# Patient Record
Sex: Female | Born: 1945 | Race: White | Hispanic: Refuse to answer | Marital: Married | Smoking: Former smoker
Health system: Southern US, Community
[De-identification: ages and names within clinical notes are randomized; demographics above are authoritative.]

## PROBLEM LIST (undated history)

## (undated) DIAGNOSIS — M810 Age-related osteoporosis without current pathological fracture: Secondary | ICD-10-CM

## (undated) DIAGNOSIS — M51369 Other intervertebral disc degeneration, lumbar region without mention of lumbar back pain or lower extremity pain: Secondary | ICD-10-CM

## (undated) DIAGNOSIS — M1990 Unspecified osteoarthritis, unspecified site: Secondary | ICD-10-CM

## (undated) DIAGNOSIS — E785 Hyperlipidemia, unspecified: Secondary | ICD-10-CM

## (undated) DIAGNOSIS — I10 Essential (primary) hypertension: Secondary | ICD-10-CM

## (undated) DIAGNOSIS — M5136 Other intervertebral disc degeneration, lumbar region: Secondary | ICD-10-CM

## (undated) HISTORY — PX: JOINT REPLACEMENT: SHX530

## (undated) HISTORY — PX: TONSILLECTOMY AND ADENOIDECTOMY: SUR1326

## (undated) HISTORY — PX: ABDOMINAL HYSTERECTOMY: SHX81

## (undated) HISTORY — DX: Age-related osteoporosis without current pathological fracture: M81.0

## (undated) HISTORY — PX: COLONOSCOPY: SHX174

## (undated) HISTORY — PX: FRACTURE SURGERY: SHX138

## (undated) HISTORY — PX: TUBAL LIGATION: SHX77

## (undated) HISTORY — DX: Essential (primary) hypertension: I10

## (undated) HISTORY — PX: LASER ABLATION: SHX1947

---

## 1993-06-03 HISTORY — PX: OTHER SURGICAL HISTORY: SHX169

## 2003-06-04 HISTORY — PX: EYE SURGERY: SHX253

## 2008-07-01 DIAGNOSIS — I10 Essential (primary) hypertension: Secondary | ICD-10-CM | POA: Insufficient documentation

## 2018-05-19 LAB — HM DEXA SCAN

## 2018-05-20 LAB — COMPREHENSIVE METABOLIC PANEL: Calcium: 9.3 (ref 8.7–10.7)

## 2018-05-20 LAB — LIPID PANEL
Cholesterol: 145 (ref 0–200)
HDL: 49 (ref 35–70)
LDL Cholesterol: 83
Triglycerides: 65 (ref 40–160)

## 2018-05-20 LAB — HEPATIC FUNCTION PANEL
ALT: 11 (ref 7–35)
AST: 16 (ref 13–35)

## 2018-05-20 LAB — BASIC METABOLIC PANEL
Creatinine: 0.6 (ref 0.5–1.1)
Glucose: 97
Potassium: 3.6 (ref 3.4–5.3)
Sodium: 144 (ref 137–147)

## 2019-06-25 LAB — VITAMIN B12: Vitamin B-12: 415

## 2019-06-25 LAB — TSH: TSH: 1.25 (ref 0.41–5.90)

## 2019-07-07 ENCOUNTER — Other Ambulatory Visit (INDEPENDENT_AMBULATORY_CARE_PROVIDER_SITE_OTHER): Payer: Self-pay | Admitting: Nurse Practitioner

## 2019-07-07 DIAGNOSIS — I83813 Varicose veins of bilateral lower extremities with pain: Secondary | ICD-10-CM

## 2019-07-09 ENCOUNTER — Ambulatory Visit (INDEPENDENT_AMBULATORY_CARE_PROVIDER_SITE_OTHER): Payer: Medicare Other

## 2019-07-09 ENCOUNTER — Ambulatory Visit (INDEPENDENT_AMBULATORY_CARE_PROVIDER_SITE_OTHER): Payer: Medicare Other | Admitting: Nurse Practitioner

## 2019-07-09 ENCOUNTER — Other Ambulatory Visit: Payer: Self-pay

## 2019-07-09 ENCOUNTER — Encounter (INDEPENDENT_AMBULATORY_CARE_PROVIDER_SITE_OTHER): Payer: Self-pay | Admitting: Nurse Practitioner

## 2019-07-09 VITALS — BP 163/94 | HR 73 | Resp 16 | Ht 68.0 in | Wt 159.0 lb

## 2019-07-09 DIAGNOSIS — I83813 Varicose veins of bilateral lower extremities with pain: Secondary | ICD-10-CM

## 2019-07-13 ENCOUNTER — Encounter (INDEPENDENT_AMBULATORY_CARE_PROVIDER_SITE_OTHER): Payer: Self-pay | Admitting: Nurse Practitioner

## 2019-07-13 NOTE — Progress Notes (Signed)
SUBJECTIVE:  Patient ID: Debbie Montgomery, female    DOB: 10/16/1945, 74 y.o.   MRN: [id number] Chief Complaint  Patient presents with  . New Patient (Initial Visit)    varicose veins with pain    HPI  Debbie Montgomery is a 74 y.o. female presents today for evaluation due to symptomatic varicose veins.  The patient has a previous history of of varicose veins bilaterally.  The patient originally began having her varicose veins treated in 2010.  The patient previously worked as a Catering manager and she had significant discomfort at work despite utilizing medical grade 1 compression therapy, elevation and exercise.  The right leg has always been worse than the left.  The patient underwent ablation of the right great saphenous vein in May 2010.  According to previous documentation from her previous vein treatments, the patient underwent sclerotherapy as well as foam sclerotherapy of her right lower extremity to help with the pain that she continued to have.  The patient does note that this was successful for some time however the patient continues to have pain and discomfort in the right leg versus the left however there is still some discomfort in the right lower extremity.  She describes it as a burning stinging "hot sensation".  This the same sensation she had prior to treatment of her right lower extremity.  The patient also continues to wear medical grade 1 compression stockings on a daily basis as she also continues to exercise in addition to elevation and utilizing NSAIDs for pain and discomfort.  This pain and discomfort is beginning to cause her lifestyle limitations such as being able to clean her home, grocery shop and do other activities of daily living.  The pain is getting to the point where it is significant and lifestyle limiting.  She denies any fever, chills, nausea, vomiting or diarrhea.  Today noninvasive studies reveal that the patient has significant reflux in the left  lower extremity.  The reflux occurs from the left great saphenous vein at the proximal thigh extending to the knee.  The vein diameters are from 0.21 cm to 0.37 cm.  Left lower extremity also has no evidence of chronic venous insufficiency noted in the deep system.  No evidence of DVT or superficial venous thrombosis.  The right lower extremity has no evidence of DVT or superficial venous thrombosis.  There is no evidence of reflux within the deep venous system.  Right great saphenous vein continues to show closure from previous ablation.  Reflux in the right small saphenous vein is not visualized.  Past Medical History:  Diagnosis Date  . Hypertension     Past Surgical History:  Procedure Laterality Date  . ABDOMINAL HYSTERECTOMY    . COLONOSCOPY    . EYE SURGERY     lasix  . LASER ABLATION Right   . TONSILLECTOMY AND ADENOIDECTOMY      Social History   Socioeconomic History  . Marital status: Married    Spouse name: Not on file  . Number of children: Not on file  . Years of education: Not on file  . Highest education level: Not on file  Occupational History  . Not on file  Tobacco Use  . Smoking status: Former Research scientist (life sciences)  . Smokeless tobacco: Never Used  Substance and Sexual Activity  . Alcohol use: Yes    Comment: ocassionally  . Drug use: Never  . Sexual activity: Not on file  Other Topics Concern  . Not on  file  Social History Narrative  . Not on file   Social Determinants of Health   Financial Resource Strain:   . Difficulty of Paying Living Expenses: Not on file  Food Insecurity:   . Worried About Programme researcher, broadcasting/film/video in the Last Year: Not on file  . Ran Out of Food in the Last Year: Not on file  Transportation Needs:   . Lack of Transportation (Medical): Not on file  . Lack of Transportation (Non-Medical): Not on file  Physical Activity:   . Days of Exercise per Week: Not on file  . Minutes of Exercise per Session: Not on file  Stress:   . Feeling of Stress :  Not on file  Social Connections:   . Frequency of Communication with Friends and Family: Not on file  . Frequency of Social Gatherings with Friends and Family: Not on file  . Attends Religious Services: Not on file  . Active Member of Clubs or Organizations: Not on file  . Attends Banker Meetings: Not on file  . Marital Status: Not on file  Intimate Partner Violence:   . Fear of Current or Ex-Partner: Not on file  . Emotionally Abused: Not on file  . Physically Abused: Not on file  . Sexually Abused: Not on file    Family History  Problem Relation Age of Onset  . Hypertension Mother   . Heart attack Maternal Grandmother   . Stroke Maternal Grandmother     No Known Allergies   Review of Systems   Review of Systems: Negative Unless Checked Constitutional: [] Weight loss  [] Fever  [] Chills Cardiac: [] Chest pain   []  Atrial Fibrillation  [] Palpitations   [] Shortness of breath when laying flat   [] Shortness of breath with exertion. [] Shortness of breath at rest Vascular:  [] Pain in legs with walking   [x] Pain in legs with standing [] Pain in legs when laying flat   [] Claudication    [] Pain in feet when laying flat    [] History of DVT   [] Phlebitis   [x] Swelling in legs   [x] Varicose veins   [] Non-healing ulcers Pulmonary:   [] Uses home oxygen   [] Productive cough   [] Hemoptysis   [] Wheeze  [] COPD   [] Asthma Neurologic:  [] Dizziness   [] Seizures  [] Blackouts [] History of stroke   [] History of TIA  [] Aphasia   [] Temporary Blindness   [] Weakness or numbness in arm   [] Weakness or numbness in leg Musculoskeletal:   [] Joint swelling   [] Joint pain   [] Low back pain  []  History of Knee Replacement [] Arthritis [] back Surgeries  []  Spinal Stenosis    Hematologic:  [] Easy bruising  [] Easy bleeding   [] Hypercoagulable state   [] Anemic Gastrointestinal:  [] Diarrhea   [] Vomiting  [] Gastroesophageal reflux/heartburn   [] Difficulty swallowing. [] Abdominal pain Genitourinary:  [] Chronic  kidney disease   [] Difficult urination  [] Anuric   [] Blood in urine [] Frequent urination  [] Burning with urination   [] Hematuria Skin:  [] Rashes   [] Ulcers [] Wounds Psychological:  [] History of anxiety   []  History of major depression  []  Memory Difficulties      OBJECTIVE:   Physical Exam  BP (!) 163/94 (BP Location: Right Arm)   Pulse 73   Resp 16   Ht 5\' 8"  (1.727 m)   Wt 159 lb (72.1 kg)   BMI 24.18 kg/m   Gen: WD/WN, NAD Head: [place]/AT, No temporalis wasting.  Ear/Nose/Throat: Hearing grossly intact, nares w/o erythema or drainage Eyes: PER, EOMI, sclera nonicteric.  Neck:  Supple, no masses.  No JVD.  Pulmonary:  Good air movement, no use of accessory muscles.  Cardiac: RRR Vascular: scattered varicosities bilaterally Vessel Right Left  Radial Palpable Palpable  Dorsalis Pedis Palpable Palpable  Posterior Tibial Palpable Palpable   Gastrointestinal: soft, non-distended. No guarding/no peritoneal signs.  Musculoskeletal: M/S 5/5 throughout.  No deformity or atrophy.  Neurologic: Pain and light touch intact in extremities.  Symmetrical.  Speech is fluent. Motor exam as listed above. Psychiatric: Judgment intact, Mood & affect appropriate for pt's clinical situation. Dermatologic: No Venous rashes. No Ulcers Noted.  No changes consistent with cellulitis. Lymph : No Cervical lymphadenopathy, no lichenification or skin changes of chronic lymphedema.       ASSESSMENT AND PLAN:  1. 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 Patient will continue  wearing graduated compression stockings class 1 on a daily basis, beginning first thing in the morning and removing them in the evening.    In addition, behavioral modification including elevation during the day was again discussed and this will continue.  The patient has utilized over the counter  pain medications and has been exercising.  However, at this time conservative therapy has not alleviated the patient's symptoms of leg pain and swelling  Recommend: laser ablation of the  left great saphenous veins to eliminate the symptoms of pain and swelling of the lower extremities caused by the severe superficial venous reflux disease.   Following laser ablation we will discuss sclerotherapy    Current Outpatient Medications on File Prior to Visit  Medication Sig Dispense Refill  . amLODipine (NORVASC) 5 MG tablet Take 5 mg by mouth daily.    . APPLE CIDER VINEGAR PO Take by mouth.    . [employer] CALCIUM 600 & VITAMIN D3 600-800 MG-UNIT TABS Take 1 tablet by mouth 2 (two) times daily.    Marland Kitchen FLUoxetine (PROZAC) 20 MG capsule Take 20 mg by mouth daily.     No current facility-administered medications on file prior to visit.    There are no Patient Instructions on file for this visit. No follow-ups on file.   Georgiana Spinner, NP  This note was completed with Office manager.  Any errors are purely unintentional.

## 2019-08-11 DIAGNOSIS — K120 Recurrent oral aphthae: Secondary | ICD-10-CM | POA: Insufficient documentation

## 2019-09-21 ENCOUNTER — Other Ambulatory Visit: Payer: Self-pay

## 2019-09-21 ENCOUNTER — Encounter: Payer: Self-pay | Admitting: Family Medicine

## 2019-09-21 ENCOUNTER — Ambulatory Visit (INDEPENDENT_AMBULATORY_CARE_PROVIDER_SITE_OTHER): Payer: Medicare Other | Admitting: Family Medicine

## 2019-09-21 VITALS — BP 130/80 | HR 68 | Ht 68.0 in | Wt 158.0 lb

## 2019-09-21 DIAGNOSIS — M4005 Postural kyphosis, thoracolumbar region: Secondary | ICD-10-CM | POA: Diagnosis not present

## 2019-09-21 DIAGNOSIS — I10 Essential (primary) hypertension: Secondary | ICD-10-CM | POA: Diagnosis not present

## 2019-09-21 DIAGNOSIS — Z0000 Encounter for general adult medical examination without abnormal findings: Secondary | ICD-10-CM

## 2019-09-21 MED ORDER — AMLODIPINE BESYLATE 5 MG PO TABS: 5.0000 mg | ORAL_TABLET | Freq: Every day | ORAL | 1 refills | Status: DC

## 2019-09-21 NOTE — Patient Instructions (Signed)
Lipoma  A lipoma is a noncancerous (benign) tumor that is made up of fat cells. This is a very common type of soft-tissue growth. Lipomas are usually found under the skin (subcutaneous). They may occur in any tissue of the body that contains fat. Common areas for lipomas to appear include the back, arms, shoulders, buttocks, and thighs. Lipomas grow slowly, and they are usually painless. Most lipomas do not cause problems and do not require treatment. What are the causes? The cause of this condition is not known. What increases the risk? You are more likely to develop this condition if:  You are 40-60 years old.  You have a family history of lipomas. What are the signs or symptoms? A lipoma usually appears as a small, round bump under the skin. In most cases, the lump will:  Feel soft or rubbery.  Not cause pain or other symptoms. However, if a lipoma is located in an area where it pushes on nerves, it can become painful or cause other symptoms. How is this diagnosed? A lipoma can usually be diagnosed with a physical exam. You may also have tests to confirm the diagnosis and to rule out other conditions. Tests may include:  Imaging tests, such as a CT scan or an MRI.  Removal of a tissue sample to be looked at under a microscope (biopsy). How is this treated? Treatment for this condition depends on the size of the lipoma and whether it is causing any symptoms.  For small lipomas that are not causing problems, no treatment is needed.  If a lipoma is bigger or it causes problems, surgery may be done to remove the lipoma. Lipomas can also be removed to improve appearance. Most often, the procedure is done after applying a medicine that numbs the area (local anesthetic).  Liposuction may be done to reduce the size of the lipoma before it is removed through surgery, or it may be done to remove the lipoma. Lipomas are removed with this method in order to limit incision size and scarring. A  liposuction tube is inserted through a small incision into the lipoma, and the contents of the lipoma are removed through the tube with suction. Follow these instructions at home:  Watch your lipoma for any changes.  Keep all follow-up visits as told by your health care provider. This is important. Contact a health care provider if:  Your lipoma becomes larger or hard.  Your lipoma becomes painful, red, or increasingly swollen. These could be signs of infection or a more serious condition. Get help right away if:  You develop tingling or numbness in an area near the lipoma. This could indicate that the lipoma is causing nerve damage. Summary  A lipoma is a noncancerous tumor that is made up of fat cells.  Most lipomas do not cause problems and do not require treatment.  If a lipoma is bigger or it causes problems, surgery may be done to remove the lipoma.  Contact a health care provider if your lipoma becomes larger or hard, or if it becomes painful, red, or increasingly swollen. Pain, redness, and swelling could be signs of infection or a more serious condition. This information is not intended to replace advice given to you by your health care provider. Make sure you discuss any questions you have with your health care provider. Document Revised: 01/04/2019 Document Reviewed: 01/04/2019 Elsevier Patient Education  2020 [employer] Inc.  

## 2019-09-21 NOTE — Progress Notes (Signed)
Date:  09/21/2019   Name:  Debbie Montgomery [facility]   DOB:  Feb 15, 1946   MRN:  [id number]   Chief Complaint: Establish Care, pneum vax 23, and Hypertension (was changed from metoprolol and lisinopril to amlodipine- needs recheck)  Patient is a 74 year old female who presents for a comprehensive physical exam. The patient reports the following problems: hypertension. Health maintenance has been reviewed up to date.  Hypertension This is a chronic problem. The current episode started more than 1 year ago. The problem has been waxing and waning since onset. The problem is controlled. Pertinent negatives include no anxiety, blurred vision, chest pain, headaches, malaise/fatigue, neck pain, orthopnea, palpitations, peripheral edema, PND, shortness of breath or sweats. There are no associated agents to hypertension. There are no known risk factors for coronary artery disease. Past treatments include calcium channel blockers. The current treatment provides moderate improvement. There are no compliance problems.  There is no history of angina, kidney disease, CAD/MI, CVA, heart failure, left ventricular hypertrophy, PVD or retinopathy. There is no history of chronic renal disease, a hypertension causing med or renovascular disease.    No results found for: CREATININE, BUN, NA, K, CL, CO2 No results found for: CHOL, HDL, LDLCALC, LDLDIRECT, TRIG, CHOLHDL No results found for: TSH No results found for: HGBA1C No results found for: WBC, HGB, HCT, MCV, PLT No results found for: ALT, AST, GGT, ALKPHOS, BILITOT   Review of Systems  Constitutional: Negative.  Negative for chills, fatigue, fever, malaise/fatigue and unexpected weight change.  HENT: Negative for congestion, ear discharge, ear pain, nosebleeds, postnasal drip, rhinorrhea, sinus pressure, sneezing and sore throat.   Eyes: Negative for blurred vision, photophobia, pain, discharge, redness and itching.  Respiratory: Negative for cough,  shortness of breath, wheezing and stridor.   Cardiovascular: Negative for chest pain, palpitations, orthopnea and PND.  Gastrointestinal: Negative for abdominal pain, blood in stool, constipation, diarrhea, nausea and vomiting.  Endocrine: Negative for cold intolerance, heat intolerance, polydipsia, polyphagia and polyuria.  Genitourinary: Negative for dysuria, flank pain, frequency, hematuria, menstrual problem, pelvic pain, urgency, vaginal bleeding and vaginal discharge.  Musculoskeletal: Negative for arthralgias, back pain, myalgias and neck pain.  Skin: Negative for rash.  Allergic/Immunologic: Negative for environmental allergies and food allergies.  Neurological: Negative for dizziness, weakness, light-headedness, numbness and headaches.  Hematological: Negative for adenopathy. Does not bruise/bleed easily.  Psychiatric/Behavioral: Negative for dysphoric mood. The patient is not nervous/anxious.     Patient Active Problem List   Diagnosis Date Noted  . High blood pressure 07/01/2008    No Known Allergies  Past Surgical History:  Procedure Laterality Date  . ABDOMINAL HYSTERECTOMY    . COLONOSCOPY    . EYE SURGERY     lasix  . LASER ABLATION Right   . TONSILLECTOMY AND ADENOIDECTOMY      Social History   Tobacco Use  . Smoking status: Former Research scientist (life sciences)  . Smokeless tobacco: Never Used  Substance Use Topics  . Alcohol use: Yes    Comment: ocassionally  . Drug use: Never     Medication list has been reviewed and updated.  Current Meds  Medication Sig  . amLODipine (NORVASC) 5 MG tablet Take 5 mg by mouth daily.  . APPLE CIDER VINEGAR PO Take by mouth.  . clobetasol ointment (TEMOVATE) 0.05 % APPLY TO AFFECTED AREA TWICE A DAY FOR 2 WEEKS THEN EVERY DAY  . COLLAGEN PO Take 18,000 each by mouth daily.  . [facility] CALCIUM 600 & VITAMIN  D3 600-800 MG-UNIT TABS Take 1 tablet by mouth 2 (two) times daily.  . fluconazole (DIFLUCAN) 150 MG tablet Take 150 mg by mouth once a  week.  Marland Kitchen FLUoxetine (PROZAC) 10 MG capsule Take 30 mg by mouth daily.  Marland Kitchen GARCINIA CAMBOGIA-CHROMIUM PO Take 1 each by mouth in the morning and at bedtime.  . Multiple Vitamins-Minerals (OSTEO COMPLEX PO) Take 1 capsule by mouth daily.  . naproxen (NAPROSYN) 500 MG tablet Take 500 mg by mouth 2 (two) times daily with a meal.  . nystatin (MYCOSTATIN) 100000 UNIT/ML suspension   . TURMERIC PO Take 2,000 each by mouth daily.  Marland Kitchen VITAMIN E-400 PO Take 2 tablets by mouth daily.  . [DISCONTINUED] FLUoxetine (PROZAC) 20 MG capsule Take 20 mg by mouth daily.    PHQ 2/9 Scores 09/21/2019  PHQ - 2 Score 0  PHQ- 9 Score 2    BP Readings from Last 3 Encounters:  09/21/19 130/80  07/09/19 (!) 163/94    Physical Exam Vitals and nursing note reviewed.  Constitutional:      General: She is not in acute distress.    Appearance: She is not diaphoretic.  HENT:     Head: Normocephalic and atraumatic.     Right Ear: Tympanic membrane, ear canal and external ear normal. There is no impacted cerumen.     Left Ear: Tympanic membrane, ear canal and external ear normal. There is no impacted cerumen.     Nose: Nose normal. No congestion or rhinorrhea.  Eyes:     General:        Right eye: No discharge.        Left eye: No discharge.     Conjunctiva/sclera: Conjunctivae normal.     Pupils: Pupils are equal, round, and reactive to light.  Neck:     Thyroid: No thyromegaly.     Vascular: No JVD.  Cardiovascular:     Rate and Rhythm: Normal rate and regular rhythm.     Pulses: Normal pulses.     Heart sounds: Normal heart sounds. No murmur. No friction rub. No gallop.   Pulmonary:     Effort: Pulmonary effort is normal.     Breath sounds: Normal breath sounds. No wheezing, rhonchi or rales.  Abdominal:     General: Bowel sounds are normal.     Palpations: Abdomen is soft. There is no mass.     Tenderness: There is no abdominal tenderness. There is no guarding.  Musculoskeletal:        General:  Normal range of motion.     Cervical back: Normal range of motion and neck supple.  Lymphadenopathy:     Cervical: No cervical adenopathy.  Skin:    General: Skin is warm and dry.     Capillary Refill: Capillary refill takes less than 2 seconds.     Findings: No bruising or erythema.          Comments: Lipoma right arm  Neurological:     Mental Status: She is alert.     Deep Tendon Reflexes: Reflexes are normal and symmetric.     Wt Readings from Last 3 Encounters:  09/21/19 158 lb (71.7 kg)  07/09/19 159 lb (72.1 kg)    BP 130/80   Pulse 68   Ht 5\' 8"  (1.727 m)   Wt 158 lb (71.7 kg)   BMI 24.02 kg/m   Assessment and Plan:  1. Encounter for medical examination to establish care Patient establishes care with new physician.  We are somewhat limited in available information and patient has recently been started on amlodipine 5 mg once a day.  Previously she was on amlodipine and metoprolol with minimal results of blood pressure lowering.  At this point time we will continue amlodipine and we will recheck patient in 6 weeks for other issues.  It was discussed with patient that we will see back any information from her [place] physicians particularly concerning previous mammograms and DEXA scan possibilities.  2. Essential hypertension .  Chronic.  Controlled.  Stable.  Will continue amlodipine 5 mg once a day.  3. Postural kyphosis of thoracolumbar region Patient has had some lower back pain that we have going to need recheck in our next appointment.  Patient was noted to have a bit of a kyphosis when walking and we will at that time reevaluate possibly to check LS spine to see if there is any which impression fractures.  4.  Lipoma which is unremarkable in growth is noted of her right arm there is no palpable firmness and it was discussed patient's options of removal versus watchful observation.

## 2019-10-29 ENCOUNTER — Other Ambulatory Visit (INDEPENDENT_AMBULATORY_CARE_PROVIDER_SITE_OTHER): Payer: Medicare Other | Admitting: Vascular Surgery

## 2019-11-02 ENCOUNTER — Other Ambulatory Visit: Payer: Self-pay

## 2019-11-02 ENCOUNTER — Encounter (INDEPENDENT_AMBULATORY_CARE_PROVIDER_SITE_OTHER): Payer: Medicare Other

## 2019-11-02 ENCOUNTER — Ambulatory Visit (INDEPENDENT_AMBULATORY_CARE_PROVIDER_SITE_OTHER): Payer: Medicare Other | Admitting: Vascular Surgery

## 2019-11-02 DIAGNOSIS — I83812 Varicose veins of left lower extremities with pain: Secondary | ICD-10-CM | POA: Diagnosis not present

## 2019-11-02 DIAGNOSIS — I83813 Varicose veins of bilateral lower extremities with pain: Secondary | ICD-10-CM | POA: Insufficient documentation

## 2019-11-02 NOTE — Progress Notes (Signed)
  Debbie Montgomery is a 74 y.o. female who presents with symptomatic venous reflux  Past Medical History:  Diagnosis Date  . Hypertension     Past Surgical History:  Procedure Laterality Date  . ABDOMINAL HYSTERECTOMY    . COLONOSCOPY    . EYE SURGERY     lasix  . LASER ABLATION Right   . TONSILLECTOMY AND ADENOIDECTOMY       Current Outpatient Medications:  .  amLODipine (NORVASC) 5 MG tablet, Take 1 tablet (5 mg total) by mouth daily., Disp: 90 tablet, Rfl: 1 .  APPLE CIDER VINEGAR PO, Take by mouth., Disp: , Rfl:  .  clobetasol ointment (TEMOVATE) 0.05 %, APPLY TO AFFECTED AREA TWICE A DAY FOR 2 WEEKS THEN EVERY DAY, Disp: , Rfl:  .  COLLAGEN PO, Take 18,000 each by mouth daily., Disp: , Rfl:  .  [employer]S CALCIUM 600 & VITAMIN D3 600-800 MG-UNIT TABS, Take 1 tablet by mouth 2 (two) times daily., Disp: , Rfl:  .  fluconazole (DIFLUCAN) 150 MG tablet, Take 150 mg by mouth once a week., Disp: , Rfl:  .  FLUoxetine (PROZAC) 10 MG capsule, Take 30 mg by mouth daily., Disp: , Rfl:  .  GARCINIA CAMBOGIA-CHROMIUM PO, Take 1 each by mouth in the morning and at bedtime., Disp: , Rfl:  .  Multiple Vitamins-Minerals (OSTEO COMPLEX PO), Take 1 capsule by mouth daily., Disp: , Rfl:  .  naproxen (NAPROSYN) 500 MG tablet, Take 500 mg by mouth 2 (two) times daily with a meal., Disp: , Rfl:  .  nystatin (MYCOSTATIN) 100000 UNIT/ML suspension, , Disp: , Rfl:  .  TURMERIC PO, Take 2,000 each by mouth daily., Disp: , Rfl:  .  VITAMIN E-400 PO, Take 2 tablets by mouth daily., Disp: , Rfl:   No Known Allergies   No problem-specific Assessment & Plan notes found for this encounter.   PLAN: [name] left lower extremity was sterilely prepped and draped. The ultrasound machine was used to visualize the saphenous vein throughout its course. A segment at the level of the knee was selected for access. The saphenous vein was accessed without difficulty using ultrasound guidance with a micropuncture  needle. A 0.018 wire was then placed beyond the saphenofemoral junction and the needle was removed. The 65 cm sheath was then placed over the wire and the wire and dilator were removed. The laser fiber was then placed through the sheath and its tip was placed approximately 4-5 centimeters below the saphenofemoral junction. Tumescent anesthesia was then created with a dilute lidocaine solution. Laser energy was then delivered with constant withdrawal of the sheath and laser fiber. Approximately 1216 joules of energy were delivered over a length of 29 centimeters using a 1470 Hz VenaCure machine at 7 W. Sterile dressings were placed. The patient tolerated the procedure well without obvious complications.   Follow-up in 1 week with post-laser duplex.

## 2019-11-03 ENCOUNTER — Ambulatory Visit
Admission: RE | Admit: 2019-11-03 | Discharge: 2019-11-03 | Disposition: A | Discharge status: Home / Self Care | Payer: Medicare Other | Source: Ambulatory Visit | Attending: Family Medicine | Admitting: Family Medicine

## 2019-11-03 ENCOUNTER — Encounter: Payer: Self-pay | Admitting: Family Medicine

## 2019-11-03 ENCOUNTER — Ambulatory Visit (INDEPENDENT_AMBULATORY_CARE_PROVIDER_SITE_OTHER): Payer: Medicare Other | Admitting: Family Medicine

## 2019-11-03 ENCOUNTER — Ambulatory Visit
Admission: RE | Admit: 2019-11-03 | Discharge: 2019-11-03 | Disposition: A | Discharge status: Home / Self Care | Payer: Medicare Other | Attending: Family Medicine | Admitting: Family Medicine

## 2019-11-03 VITALS — BP 140/100 | HR 72 | Ht 68.0 in | Wt 154.0 lb

## 2019-11-03 DIAGNOSIS — M545 Low back pain, unspecified: Secondary | ICD-10-CM

## 2019-11-03 DIAGNOSIS — Z1231 Encounter for screening mammogram for malignant neoplasm of breast: Secondary | ICD-10-CM | POA: Diagnosis not present

## 2019-11-03 DIAGNOSIS — N3090 Cystitis, unspecified without hematuria: Secondary | ICD-10-CM | POA: Diagnosis not present

## 2019-11-03 DIAGNOSIS — I10 Essential (primary) hypertension: Secondary | ICD-10-CM | POA: Diagnosis not present

## 2019-11-03 LAB — POCT URINALYSIS DIPSTICK
Bilirubin, UA: 0.2
Glucose, UA: NEGATIVE
Ketones, UA: NEGATIVE
Nitrite, UA: POSITIVE
Protein, UA: NEGATIVE
Spec Grav, UA: 1.01 (ref 1.010–1.025)
Urobilinogen, UA: 0.2 E.U./dL
pH, UA: 5 (ref 5.0–8.0)

## 2019-11-03 MED ORDER — HYDROCHLOROTHIAZIDE 12.5 MG PO TABS: 12.5000 mg | ORAL_TABLET | Freq: Every day | ORAL | 3 refills | Status: DC

## 2019-11-03 MED ORDER — CEPHALEXIN 500 MG PO CAPS: 500.0000 mg | ORAL_CAPSULE | Freq: Three times a day (TID) | ORAL | 0 refills | Status: DC

## 2019-11-03 NOTE — Progress Notes (Signed)
Date:  11/03/2019   Name:  Tracia Lacomb [facility]   DOB:  01-09-1946   MRN:  [id number]   Chief Complaint: breast exam and Back Pain (lower back pain, pressure- wants checked for UTI)  Back Pain This is a chronic problem. The current episode started more than 1 year ago. The problem has been gradually worsening since onset. The pain is present in the lumbar spine. The quality of the pain is described as aching. Radiates to: right hip. The pain is moderate. The symptoms are aggravated by bending and twisting. Pertinent negatives include no abdominal pain, bladder incontinence, bowel incontinence, dysuria, fever, headaches, numbness, paresthesias, pelvic pain or weakness.    No results found for: CREATININE, BUN, NA, K, CL, CO2 No results found for: CHOL, HDL, LDLCALC, LDLDIRECT, TRIG, CHOLHDL No results found for: TSH No results found for: HGBA1C No results found for: WBC, HGB, HCT, MCV, PLT No results found for: ALT, AST, GGT, ALKPHOS, BILITOT   Review of Systems  Constitutional: Negative.  Negative for chills, fatigue, fever and unexpected weight change.  HENT: Negative for congestion, ear discharge, ear pain, rhinorrhea, sinus pressure, sneezing and sore throat.   Eyes: Negative for photophobia, pain, discharge, redness and itching.  Respiratory: Negative for cough, shortness of breath, wheezing and stridor.   Gastrointestinal: Negative for abdominal pain, blood in stool, bowel incontinence, constipation, diarrhea, nausea and vomiting.  Endocrine: Negative for cold intolerance, heat intolerance, polydipsia, polyphagia and polyuria.  Genitourinary: Negative for bladder incontinence, dysuria, flank pain, frequency, hematuria, menstrual problem, pelvic pain, urgency, vaginal bleeding and vaginal discharge.  Musculoskeletal: Positive for back pain. Negative for arthralgias and myalgias.  Skin: Negative for rash.  Allergic/Immunologic: Negative for environmental allergies and food  allergies.  Neurological: Negative for dizziness, weakness, light-headedness, numbness, headaches and paresthesias.  Hematological: Negative for adenopathy. Does not bruise/bleed easily.  Psychiatric/Behavioral: Negative for dysphoric mood. The patient is not nervous/anxious.     Patient Active Problem List   Diagnosis Date Noted  . 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 High blood pressure 07/01/2008    No Known Allergies  Past Surgical History:  Procedure Laterality Date  . ABDOMINAL HYSTERECTOMY    . COLONOSCOPY    . EYE SURGERY     lasix  . LASER ABLATION Right   . TONSILLECTOMY AND ADENOIDECTOMY      Social History   Tobacco Use  . Smoking status: Former Games developer  . Smokeless tobacco: Never Used  Substance Use Topics  . Alcohol use: Yes    Comment: ocassionally  . Drug use: Never     Medication list has been reviewed and updated.  Current Meds  Medication Sig  . amLODipine (NORVASC) 5 MG tablet Take 1 tablet (5 mg total) by mouth daily.  . APPLE CIDER VINEGAR PO Take by mouth.  . COLLAGEN PO Take 18,000 each by mouth daily.  . [employer] CALCIUM 600 & VITAMIN D3 600-800 MG-UNIT TABS Take 1 tablet by mouth 2 (two) times daily.  Marland Kitchen FLUoxetine (PROZAC) 10 MG capsule Take 30 mg by mouth daily.  Marland Kitchen GARCINIA CAMBOGIA-CHROMIUM PO Take 1 each by mouth in the morning and at bedtime.  . Multiple Vitamins-Minerals (OSTEO COMPLEX PO) Take 1 capsule by mouth daily.  . naproxen (NAPROSYN) 500 MG tablet Take 500 mg by mouth 2 (two) times daily with a meal.  . nystatin (MYCOSTATIN) 100000 UNIT/ML suspension   . TURMERIC PO Take 2,000 each by mouth daily.  Marland Kitchen VITAMIN  E-400 PO Take 2 tablets by mouth daily.    PHQ 2/9 Scores 11/03/2019 09/21/2019  PHQ - 2 Score 0 0  PHQ- 9 Score 0 2    BP Readings from Last 3 Encounters:  11/03/19 (!) 140/100  11/02/19 (!) 148/84  09/21/19 130/80    Physical Exam Vitals and nursing note reviewed.  Constitutional:       Appearance: She is well-developed.  HENT:     Head: Normocephalic.     Right Ear: Tympanic membrane, ear canal and external ear normal.     Left Ear: Tympanic membrane, ear canal and external ear normal.  Eyes:     General: Lids are everted, no foreign bodies appreciated. No scleral icterus.       Left eye: No foreign body or hordeolum.     Conjunctiva/sclera: Conjunctivae normal.     Right eye: Right conjunctiva is not injected.     Left eye: Left conjunctiva is not injected.     Pupils: Pupils are equal, round, and reactive to light.  Neck:     Thyroid: No thyromegaly.     Vascular: No JVD.     Trachea: No tracheal deviation.  Cardiovascular:     Rate and Rhythm: Normal rate and regular rhythm.     Chest Wall: PMI is not displaced. No thrill.     Pulses: Normal pulses.          Carotid pulses are 2+ on the right side and 2+ on the left side.      Radial pulses are 2+ on the right side and 2+ on the left side.       Femoral pulses are 2+ on the right side and 2+ on the left side.      Popliteal pulses are 2+ on the right side and 2+ on the left side.       Dorsalis pedis pulses are 2+ on the right side and 2+ on the left side.       Posterior tibial pulses are 2+ on the right side and 2+ on the left side.     Heart sounds: Normal heart sounds, S1 normal and S2 normal. No murmur. No systolic murmur. No diastolic murmur. No friction rub. No gallop. No S3 or S4 sounds.   Pulmonary:     Effort: Pulmonary effort is normal. No respiratory distress.     Breath sounds: Normal breath sounds. No wheezing or rales.  Chest:     Chest wall: No mass.     Breasts:        Right: Normal. No swelling, bleeding, inverted nipple, mass, nipple discharge, skin change or tenderness.        Left: Normal. No swelling, bleeding, inverted nipple, mass, nipple discharge or skin change.  Abdominal:     General: Bowel sounds are normal.     Palpations: Abdomen is soft. There is no mass.     Tenderness:  There is no abdominal tenderness. There is no guarding or rebound.  Musculoskeletal:        General: No tenderness. Normal range of motion.     Cervical back: Normal range of motion and neck supple.  Lymphadenopathy:     Cervical: No cervical adenopathy.  Skin:    General: Skin is warm.     Findings: No rash.  Neurological:     Mental Status: She is alert and oriented to person, place, and time.     Cranial Nerves: No cranial nerve deficit.  Deep Tendon Reflexes: Reflexes normal.  Psychiatric:        Mood and Affect: Mood is not anxious or depressed.     Wt Readings from Last 3 Encounters:  11/03/19 154 lb (69.9 kg)  11/02/19 157 lb (71.2 kg)  09/21/19 158 lb (71.7 kg)    BP (!) 140/100   Pulse 72   Ht 5\' 8"  (1.727 m)   Wt 154 lb (69.9 kg)   BMI 23.42 kg/m   Assessment and Plan: 1. Lumbar pain Chronic.  Episodic.  Waxes and wanes.  Patient has had episodes of lower back pain more so since a fall that she had several years ago.  Recent DEXA scan noted no osteoporosis.  We will obtain a lumbar x-ray to evaluate for degenerative disc disease.  Pending that we will determine which direction will take whether we refer or use NSAIDs. - DG Lumbar Spine Complete; Future  2. Breast cancer screening by mammogram Patient has had previous screenings with some abnormalities that are nonspecific.  We will schedule her bilateral 3D screening and patient has just to give to radiologist for comparison. - MM 3D SCREEN BREAST BILATERAL; Future  3. Cystitis Urinalysis done is noted that there is erythrocytes and leukocytes present.  We will treat with cephalexin 500 mg 3 times a day for 3 days for suspected UTI of an asymptomatic nature. - cephALEXin (KEFLEX) 500 MG capsule; Take 1 capsule (500 mg total) by mouth 3 (three) times daily.  Dispense: 9 capsule; Refill: 0 - POCT urinalysis dipstick  4. Essential hypertension Last 2-3 visits systolic and at times diastolic pressures have been  elevated patient is currently on amlodipine 5 mg once a day.  We will add hydrochlorothiazide 12.5 mg and recheck in 6 weeks at which time if tolerable will continue and at that time obtain a renal function panel. - hydrochlorothiazide (HYDRODIURIL) 12.5 MG tablet; Take 1 tablet (12.5 mg total) by mouth daily.  Dispense: 90 tablet; Refill: 3

## 2019-11-03 NOTE — Patient Instructions (Signed)
DASH Eating Plan DASH stands for "Dietary Approaches to Stop Hypertension." The DASH eating plan is a healthy eating plan that has been shown to reduce high blood pressure (hypertension). It may also reduce your risk for type 2 diabetes, heart disease, and stroke. The DASH eating plan may also help with weight loss. What are tips for following this plan?  General guidelines  Avoid eating more than 2,300 mg (milligrams) of salt (sodium) a day. If you have hypertension, you may need to reduce your sodium intake to 1,500 mg a day.  Limit alcohol intake to no more than 1 drink a day for nonpregnant women and 2 drinks a day for men. One drink equals 12 oz of beer, 5 oz of wine, or 1 oz of hard liquor.  Work with your health care provider to maintain a healthy body weight or to lose weight. Ask what an ideal weight is for you.  Get at least 30 minutes of exercise that causes your heart to beat faster (aerobic exercise) most days of the week. Activities may include walking, swimming, or biking.  Work with your health care provider or diet and nutrition specialist (dietitian) to adjust your eating plan to your individual calorie needs. Reading food labels   Check food labels for the amount of sodium per serving. Choose foods with less than 5 percent of the Daily Value of sodium. Generally, foods with less than 300 mg of sodium per serving fit into this eating plan.  To find whole grains, look for the word "whole" as the first word in the ingredient list. Shopping  Buy products labeled as "low-sodium" or "no salt added."  Buy fresh foods. Avoid canned foods and premade or frozen meals. Cooking  Avoid adding salt when cooking. Use salt-free seasonings or herbs instead of table salt or sea salt. Check with your health care provider or pharmacist before using salt substitutes.  Do not fry foods. Cook foods using healthy methods such as baking, boiling, grilling, and broiling instead.  Cook with  heart-healthy oils, such as olive, canola, soybean, or sunflower oil. Meal planning  Eat a balanced diet that includes: ? 5 or more servings of fruits and vegetables each day. At each meal, try to fill half of your plate with fruits and vegetables. ? Up to 6-8 servings of whole grains each day. ? Less than 6 oz of lean meat, poultry, or fish each day. A 3-oz serving of meat is about the same size as a deck of cards. One egg equals 1 oz. ? 2 servings of low-fat dairy each day. ? A serving of nuts, seeds, or beans 5 times each week. ? Heart-healthy fats. Healthy fats called Omega-3 fatty acids are found in foods such as flaxseeds and coldwater fish, like sardines, salmon, and mackerel.  Limit how much you eat of the following: ? Canned or prepackaged foods. ? Food that is high in trans fat, such as fried foods. ? Food that is high in saturated fat, such as fatty meat. ? Sweets, desserts, sugary drinks, and other foods with added sugar. ? Full-fat dairy products.  Do not salt foods before eating.  Try to eat at least 2 vegetarian meals each week.  Eat more home-cooked food and less restaurant, buffet, and fast food.  When eating at a restaurant, ask that your food be prepared with less salt or no salt, if possible. What foods are recommended? The items listed may not be a complete list. Talk with your dietitian about   what dietary choices are best for you. Grains Whole-grain or whole-wheat bread. Whole-grain or whole-wheat pasta. Brown rice. Oatmeal. Quinoa. Bulgur. Whole-grain and low-sodium cereals. Pita bread. Low-fat, low-sodium crackers. Whole-wheat flour tortillas. Vegetables Fresh or frozen vegetables (raw, steamed, roasted, or grilled). Low-sodium or reduced-sodium tomato and vegetable juice. Low-sodium or reduced-sodium tomato sauce and tomato paste. Low-sodium or reduced-sodium canned vegetables. Fruits All fresh, dried, or frozen fruit. Canned fruit in natural juice (without  added sugar). Meat and other protein foods Skinless chicken or turkey. Ground chicken or turkey. Pork with fat trimmed off. Fish and seafood. Egg whites. Dried beans, peas, or lentils. Unsalted nuts, nut butters, and seeds. Unsalted canned beans. Lean cuts of beef with fat trimmed off. Low-sodium, lean deli meat. Dairy Low-fat (1%) or fat-free (skim) milk. Fat-free, low-fat, or reduced-fat cheeses. Nonfat, low-sodium ricotta or cottage cheese. Low-fat or nonfat yogurt. Low-fat, low-sodium cheese. Fats and oils Soft margarine without trans fats. Vegetable oil. Low-fat, reduced-fat, or light mayonnaise and salad dressings (reduced-sodium). Canola, safflower, olive, soybean, and sunflower oils. Avocado. Seasoning and other foods Herbs. Spices. Seasoning mixes without salt. Unsalted popcorn and pretzels. Fat-free sweets. What foods are not recommended? The items listed may not be a complete list. Talk with your dietitian about what dietary choices are best for you. Grains Baked goods made with fat, such as croissants, muffins, or some breads. Dry pasta or rice meal packs. Vegetables Creamed or fried vegetables. Vegetables in a cheese sauce. Regular canned vegetables (not low-sodium or reduced-sodium). Regular canned tomato sauce and paste (not low-sodium or reduced-sodium). Regular tomato and vegetable juice (not low-sodium or reduced-sodium). Pickles. Olives. Fruits Canned fruit in a light or heavy syrup. Fried fruit. Fruit in cream or butter sauce. Meat and other protein foods Fatty cuts of meat. Ribs. Fried meat. Bacon. Sausage. Bologna and other processed lunch meats. Salami. Fatback. Hotdogs. Bratwurst. Salted nuts and seeds. Canned beans with added salt. Canned or smoked fish. Whole eggs or egg yolks. Chicken or turkey with skin. Dairy Whole or 2% milk, cream, and half-and-half. Whole or full-fat cream cheese. Whole-fat or sweetened yogurt. Full-fat cheese. Nondairy creamers. Whipped toppings.  Processed cheese and cheese spreads. Fats and oils Butter. Stick margarine. Lard. Shortening. Ghee. Bacon fat. Tropical oils, such as coconut, palm kernel, or palm oil. Seasoning and other foods Salted popcorn and pretzels. Onion salt, garlic salt, seasoned salt, table salt, and sea salt. Worcestershire sauce. Tartar sauce. Barbecue sauce. Teriyaki sauce. Soy sauce, including reduced-sodium. Steak sauce. Canned and packaged gravies. Fish sauce. Oyster sauce. Cocktail sauce. Horseradish that you find on the shelf. Ketchup. Mustard. Meat flavorings and tenderizers. Bouillon cubes. Hot sauce and Tabasco sauce. Premade or packaged marinades. Premade or packaged taco seasonings. Relishes. Regular salad dressings. Where to find more information:  National Heart, Lung, and Blood Institute: www.nhlbi.nih.gov  American Heart Association: www.heart.org Summary  The DASH eating plan is a healthy eating plan that has been shown to reduce high blood pressure (hypertension). It may also reduce your risk for type 2 diabetes, heart disease, and stroke.  With the DASH eating plan, you should limit salt (sodium) intake to 2,300 mg a day. If you have hypertension, you may need to reduce your sodium intake to 1,500 mg a day.  When on the DASH eating plan, aim to eat more fresh fruits and vegetables, whole grains, lean proteins, low-fat dairy, and heart-healthy fats.  Work with your health care provider or diet and nutrition specialist (dietitian) to adjust your eating plan to your   individual calorie needs. This information is not intended to replace advice given to you by your health care provider. Make sure you discuss any questions you have with your health care provider. Document Revised: 05/02/2017 Document Reviewed: 05/13/2016 Elsevier Patient Education  2020 Elsevier Inc.  

## 2019-11-04 ENCOUNTER — Telehealth: Payer: Self-pay | Admitting: Family Medicine

## 2019-11-04 ENCOUNTER — Other Ambulatory Visit: Payer: Self-pay

## 2019-11-04 ENCOUNTER — Encounter: Payer: Self-pay | Admitting: Family Medicine

## 2019-11-04 ENCOUNTER — Ambulatory Visit (INDEPENDENT_AMBULATORY_CARE_PROVIDER_SITE_OTHER): Payer: Medicare Other

## 2019-11-04 DIAGNOSIS — I83813 Varicose veins of bilateral lower extremities with pain: Secondary | ICD-10-CM

## 2019-11-04 DIAGNOSIS — M545 Low back pain, unspecified: Secondary | ICD-10-CM

## 2019-11-04 NOTE — Progress Notes (Signed)
Ref. To ortho placed

## 2019-11-04 NOTE — Telephone Encounter (Signed)
Copied from CRM [id number]. Topic: General - Inquiry >> Nov 04, 2019  4:39 PM Deborha Payment wrote: Reason for CRM: Marchelle Folks from [facility] with Tawanna Cooler Monday called regarding patients referral to their office.  Marchelle Folks states the referral keeps getting sent through electronically and they need a paper fax sent over instead.  Office is with [facility] so office cannot see referral that our office has been sending.  Fax [phone] attention Marchelle Folks

## 2019-11-05 NOTE — Telephone Encounter (Signed)
Debbie Montgomery ! I'm New at [facility]. This came over to me, and I just want to be sure you seen this about the ortho referral. I see where you faxed it already but just want to send over for review.   KP

## 2019-11-09 ENCOUNTER — Ambulatory Visit
Admission: RE | Admit: 2019-11-09 | Discharge: 2019-11-09 | Disposition: A | Discharge status: Home / Self Care | Payer: Medicare Other | Source: Ambulatory Visit | Attending: Family Medicine | Admitting: Family Medicine

## 2019-11-09 DIAGNOSIS — Z1231 Encounter for screening mammogram for malignant neoplasm of breast: Secondary | ICD-10-CM | POA: Diagnosis not present

## 2019-12-02 ENCOUNTER — Ambulatory Visit (INDEPENDENT_AMBULATORY_CARE_PROVIDER_SITE_OTHER): Payer: Medicare Other | Admitting: Nurse Practitioner

## 2019-12-09 ENCOUNTER — Ambulatory Visit (INDEPENDENT_AMBULATORY_CARE_PROVIDER_SITE_OTHER): Payer: Medicare Other | Admitting: Nurse Practitioner

## 2019-12-14 ENCOUNTER — Ambulatory Visit (INDEPENDENT_AMBULATORY_CARE_PROVIDER_SITE_OTHER): Payer: Medicare Other | Admitting: Nurse Practitioner

## 2019-12-14 ENCOUNTER — Encounter (INDEPENDENT_AMBULATORY_CARE_PROVIDER_SITE_OTHER): Payer: Self-pay | Admitting: Nurse Practitioner

## 2019-12-14 ENCOUNTER — Other Ambulatory Visit: Payer: Self-pay

## 2019-12-14 VITALS — BP 145/86 | HR 69 | Ht 68.0 in | Wt 155.0 lb

## 2019-12-14 DIAGNOSIS — I83813 Varicose veins of bilateral lower extremities with pain: Secondary | ICD-10-CM | POA: Diagnosis not present

## 2019-12-14 DIAGNOSIS — I10 Essential (primary) hypertension: Secondary | ICD-10-CM

## 2019-12-14 NOTE — Progress Notes (Signed)
Subjective:    Patient ID: Debbie Montgomery, female    DOB: 03/12/1946, 74 y.o.   MRN: [id number] Chief Complaint  Patient presents with  . Follow-up    4 wk post laser     The patient returns to the office for followup status post laser ablation of the left great saphenous vein on 11/02/2019. The patient notes multiple residual varicosities bilaterally which continued to hurt with dependent positions and remained tender to palpation. [name] swelling is unchanged from preoperative status. The patient continues to wear graduated compression stockings on a daily basis but these are not eliminating the pain and discomfort. The patient continues to use over-the-counter anti-inflammatory medications to treat the pain and related symptoms but this has not given the patient relief. The patient notes the pain in the lower extremities is causing problems with daily exercise, problems at work and even with household activities such as preparing meals and doing dishes.  The patient is otherwise done well and there have been no complications related to the laser procedure or interval changes in the patient's overall   Venous ultrasound post laser shows successful laser ablation of the left great saphenous vein, no DVT identified.   Review of Systems  Cardiovascular:       Spider varicosities bilaterally  All other systems reviewed and are negative.      Objective:   Physical Exam Vitals reviewed.  HENT:     Head: Normocephalic.  Cardiovascular:     Rate and Rhythm: Normal rate.     Pulses: Normal pulses.     Comments: Scattered spider varicosities bilaterally Pulmonary:     Effort: Pulmonary effort is normal.  Skin:    General: Skin is warm and dry.  Neurological:     Mental Status: She is alert and oriented to person, place, and time.  Psychiatric:        Mood and Affect: Mood normal.        Behavior: Behavior normal.        Thought Content: Thought content normal.         Judgment: Judgment normal.     BP (!) 145/86   Pulse 69   Ht 5\' 8"  (1.727 m)   Wt 155 lb (70.3 kg)   BMI 23.57 kg/m   Past Medical History:  Diagnosis Date  . Hypertension     Social History   Socioeconomic History  . Marital status: Married    Spouse name: Not on file  . Number of children: Not on file  . Years of education: Not on file  . Highest education level: Not on file  Occupational History  . Not on file  Tobacco Use  . Smoking status: Former  . Smokeless tobacco: Never Used  Substance and Sexual Activity  . Alcohol use: Yes    Comment: ocassionally  . Drug use: Never  . Sexual activity: Not Currently  Other Topics Concern  . Not on file  Social History Narrative  . Not on file   Social Determinants of Health   Financial Resource Strain:   . Difficulty of Paying Living Expenses:   Food Insecurity:   . Worried About Games developer in the Last Year:   . Programme researcher, broadcasting/film/video in the Last Year:   Transportation Needs:   . Barista (Medical):   Freight forwarder Lack of Transportation (Non-Medical):   Physical Activity:   . Days of Exercise per Week:   . Minutes  of Exercise per Session:   Stress:   . Feeling of Stress :   Social Connections:   . Frequency of Communication with Friends and Family:   . Frequency of Social Gatherings with Friends and Family:   . Attends Religious Services:   . Active Member of Clubs or Organizations:   . Attends Banker Meetings:   Marland Kitchen Marital Status:   Intimate Partner Violence:   . Fear of Current or Ex-Partner:   . Emotionally Abused:   Marland Kitchen Physically Abused:   . Sexually Abused:     Past Surgical History:  Procedure Laterality Date  . ABDOMINAL HYSTERECTOMY    . bladder lift  1995  . COLONOSCOPY    . EYE SURGERY     lasix  . LASER ABLATION Right   . TONSILLECTOMY AND ADENOIDECTOMY    . TUBAL LIGATION      Family History  Problem Relation Age of Onset  . Hypertension Mother   . Heart  disease Father   . Heart attack Maternal Grandmother   . Stroke Maternal Grandmother     No Known Allergies     Assessment & Plan:   1. Varicose veins of leg with pain, bilateral Recommend:  The patient has had successful ablation of the previously incompetent saphenous venous system but still has persistent symptoms of pain and swelling that are having a negative impact on daily life and daily activities.  Patient should undergo injection sclerotherapy to treat the residual varicosities.  The risks, benefits and alternative therapies were reviewed in detail with the patient.  All questions were answered.  The patient agrees to proceed with sclerotherapy at their convenience.  The patient will continue wearing the graduated compression stockings and using the over-the-counter pain medications to treat her symptoms.       2. Essential hypertension Continue antihypertensive medications as already ordered, these medications have been reviewed and there are no changes at this time.    Current Outpatient Medications on File Prior to Visit  Medication Sig Dispense Refill  . amLODipine (NORVASC) 5 MG tablet Take 1 tablet (5 mg total) by mouth daily. 90 tablet 1  . APPLE CIDER VINEGAR PO Take by mouth.    . COLLAGEN PO Take 18,000 each by mouth daily.    . [facility] CALCIUM 600 & VITAMIN D3 600-800 MG-UNIT TABS Take 1 tablet by mouth 2 (two) times daily.    Marland Kitchen FLUoxetine (PROZAC) 10 MG capsule Take 30 mg by mouth daily.    Marland Kitchen GARCINIA CAMBOGIA-CHROMIUM PO Take 1 each by mouth in the morning and at bedtime.    . Multiple Vitamins-Minerals (OSTEO COMPLEX PO) Take 1 capsule by mouth daily.    . TURMERIC PO Take 2,000 each by mouth daily.    Marland Kitchen VITAMIN E-400 PO Take 2 tablets by mouth daily.    . cephALEXin (KEFLEX) 500 MG capsule Take 1 capsule (500 mg total) by mouth 3 (three) times daily. (Patient not taking: Reported on 12/14/2019) 9 capsule 0  . hydrochlorothiazide (HYDRODIURIL) 12.5 MG  tablet Take 1 tablet (12.5 mg total) by mouth daily. (Patient not taking: Reported on 12/14/2019) 90 tablet 3  . naproxen (NAPROSYN) 500 MG tablet Take 500 mg by mouth 2 (two) times daily with a meal. (Patient not taking: Reported on 12/14/2019)    . nystatin (MYCOSTATIN) 100000 UNIT/ML suspension      No current facility-administered medications on file prior to visit.    There are no Patient Instructions on file  for this visit. No follow-ups on file.   Kris Hartmann, NP

## 2019-12-16 DIAGNOSIS — Z808 Family history of malignant neoplasm of other organs or systems: Secondary | ICD-10-CM | POA: Insufficient documentation

## 2019-12-17 ENCOUNTER — Ambulatory Visit (INDEPENDENT_AMBULATORY_CARE_PROVIDER_SITE_OTHER): Payer: Medicare Other | Admitting: Family Medicine

## 2019-12-17 ENCOUNTER — Other Ambulatory Visit: Payer: Self-pay

## 2019-12-17 ENCOUNTER — Encounter: Payer: Self-pay | Admitting: Family Medicine

## 2019-12-17 VITALS — BP 128/78 | HR 60 | Ht 68.0 in | Wt 152.0 lb

## 2019-12-17 DIAGNOSIS — Z0000 Encounter for general adult medical examination without abnormal findings: Secondary | ICD-10-CM | POA: Diagnosis not present

## 2019-12-17 DIAGNOSIS — R350 Frequency of micturition: Secondary | ICD-10-CM

## 2019-12-17 DIAGNOSIS — I10 Essential (primary) hypertension: Secondary | ICD-10-CM

## 2019-12-17 LAB — POCT URINALYSIS DIPSTICK
Bilirubin, UA: NEGATIVE
Blood, UA: NEGATIVE
Glucose, UA: NEGATIVE
Ketones, UA: NEGATIVE
Leukocytes, UA: NEGATIVE
Nitrite, UA: POSITIVE
Protein, UA: NEGATIVE
Spec Grav, UA: 1.01 (ref 1.010–1.025)
Urobilinogen, UA: 0.2 E.U./dL
pH, UA: 7 (ref 5.0–8.0)

## 2019-12-17 MED ORDER — HYDROCHLOROTHIAZIDE 12.5 MG PO TABS: 12.5000 mg | ORAL_TABLET | Freq: Every day | ORAL | 1 refills | Status: DC

## 2019-12-17 NOTE — Progress Notes (Signed)
Date:  12/17/2019   Name:  Debbie Montgomery [facility]   DOB:  1945/06/27   MRN:  [id number]   Chief Complaint: Hypertension  Hypertension This is a chronic problem. The current episode started more than 1 year ago. The problem has been gradually improving since onset. The problem is controlled. Pertinent negatives include no anxiety, blurred vision, chest pain, headaches, malaise/fatigue, neck pain, orthopnea, palpitations, peripheral edema, PND, shortness of breath or sweats. There are no associated agents to hypertension. Past treatments include diuretics. The current treatment provides moderate improvement. There are no compliance problems.  There is no history of angina, kidney disease, CAD/MI, CVA, heart failure, left ventricular hypertrophy, PVD or retinopathy. There is no history of chronic renal disease, a hypertension causing med or renovascular disease.  Urinary Frequency  This is a new problem. The current episode started 1 to 4 weeks ago. The problem has been unchanged. Pertinent negatives include no chills, flank pain, frequency, hematuria, nausea, sweats, urgency or vomiting.    Lab Results  Component Value Date   CREATININE 0.6 05/20/2018   NA 144 05/20/2018   K 3.6 05/20/2018   Lab Results  Component Value Date   CHOL 145 05/20/2018   HDL 49 05/20/2018   LDLCALC 83 05/20/2018   TRIG 65 05/20/2018   Lab Results  Component Value Date   TSH 1.25 06/25/2019   No results found for: HGBA1C No results found for: WBC, HGB, HCT, MCV, PLT Lab Results  Component Value Date   ALT 11 05/20/2018   AST 16 05/20/2018     Review of Systems  Constitutional: Negative.  Negative for chills, fatigue, fever, malaise/fatigue and unexpected weight change.  HENT: Negative for congestion, ear discharge, ear pain, rhinorrhea, sinus pressure, sneezing and sore throat.   Eyes: Negative for blurred vision, photophobia, pain, discharge, redness and itching.  Respiratory: Negative for  cough, shortness of breath, wheezing and stridor.   Cardiovascular: Negative for chest pain, palpitations, orthopnea and PND.  Gastrointestinal: Negative for abdominal pain, blood in stool, constipation, diarrhea, nausea and vomiting.  Endocrine: Negative for cold intolerance, heat intolerance, polydipsia, polyphagia and polyuria.  Genitourinary: Negative for dysuria, flank pain, frequency, hematuria, menstrual problem, pelvic pain, urgency, vaginal bleeding and vaginal discharge.  Musculoskeletal: Negative for arthralgias, back pain, myalgias and neck pain.  Skin: Negative for rash.  Allergic/Immunologic: Negative for environmental allergies and food allergies.  Neurological: Negative for dizziness, weakness, light-headedness, numbness and headaches.  Hematological: Negative for adenopathy. Does not bruise/bleed easily.  Psychiatric/Behavioral: Negative for dysphoric mood. The patient is not nervous/anxious.     Patient Active Problem List   Diagnosis Date Noted  . 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 High blood pressure 07/01/2008    No Known Allergies  Past Surgical History:  Procedure Laterality Date  . ABDOMINAL HYSTERECTOMY    . bladder lift  1995  . COLONOSCOPY    . EYE SURGERY     lasix  . LASER ABLATION Right   . TONSILLECTOMY AND ADENOIDECTOMY    . TUBAL LIGATION      Social History   Tobacco Use  . Smoking status: Former Games developer  . Smokeless tobacco: Never Used  Substance Use Topics  . Alcohol use: Yes    Comment: ocassionally  . Drug use: Never     Medication list has been reviewed and updated.  Current Meds  Medication Sig  . amLODipine (NORVASC) 5 MG tablet Take 1 tablet (5 mg total) by  mouth daily.  . APPLE CIDER VINEGAR PO Take by mouth.  . COLLAGEN PO Take 18,000 each by mouth daily.  Marland Kitchen FLUoxetine (PROZAC) 10 MG capsule Take 30 mg by mouth daily.  Marland Kitchen GARCINIA CAMBOGIA-CHROMIUM PO Take 1 each by mouth in the morning and at  bedtime.  . hydrochlorothiazide (HYDRODIURIL) 12.5 MG tablet Take 1 tablet (12.5 mg total) by mouth daily.  . Multiple Vitamins-Minerals (OSTEO COMPLEX PO) Take 1 capsule by mouth daily.  . TURMERIC PO Take 2,000 each by mouth daily.  Marland Kitchen VITAMIN E-400 PO Take 2 tablets by mouth daily.  . [DISCONTINUED] [employer] CALCIUM 600 & VITAMIN D3 600-800 MG-UNIT TABS Take 1 tablet by mouth 2 (two) times daily.    PHQ 2/9 Scores 12/17/2019 11/03/2019 09/21/2019  PHQ - 2 Score 0 0 0  PHQ- 9 Score 0 0 2    GAD 7 : Generalized Anxiety Score 12/17/2019 11/03/2019 09/21/2019  Nervous, Anxious, on Edge 0 0 0  Control/stop worrying 0 0 0  Worry too much - different things 0 0 0  Trouble relaxing 0 0 0  Restless 0 0 0  Easily annoyed or irritable 0 0 0  Afraid - awful might happen 0 0 0  Total GAD 7 Score 0 0 0    BP Readings from Last 3 Encounters:  12/17/19 128/78  12/14/19 (!) 145/86  11/03/19 (!) 140/100    Physical Exam Vitals and nursing note reviewed.  Constitutional:      General: She is not in acute distress.    Appearance: She is not diaphoretic.  HENT:     Head: Normocephalic and atraumatic.     Right Ear: Tympanic membrane, ear canal and external ear normal.     Left Ear: Tympanic membrane, ear canal and external ear normal.     Nose: Nose normal. No congestion or rhinorrhea.     Mouth/Throat:     Mouth: Mucous membranes are moist.  Eyes:     General:        Right eye: No discharge.        Left eye: No discharge.     Conjunctiva/sclera: Conjunctivae normal.     Pupils: Pupils are equal, round, and reactive to light.  Neck:     Thyroid: No thyromegaly.     Vascular: No JVD.  Cardiovascular:     Rate and Rhythm: Normal rate and regular rhythm.     Pulses: Normal pulses.     Heart sounds: Normal heart sounds. No murmur heard.  No friction rub. No gallop.   Pulmonary:     Effort: Pulmonary effort is normal.     Breath sounds: Normal breath sounds. No wheezing, rhonchi or rales.    Abdominal:     General: Bowel sounds are normal.     Palpations: Abdomen is soft. There is no mass.     Tenderness: There is no abdominal tenderness. There is no guarding.  Musculoskeletal:        General: Normal range of motion.     Cervical back: Normal range of motion and neck supple.  Lymphadenopathy:     Cervical: No cervical adenopathy.  Skin:    General: Skin is warm and dry.     Capillary Refill: Capillary refill takes less than 2 seconds.  Neurological:     Mental Status: She is alert.     Deep Tendon Reflexes: Reflexes are normal and symmetric.     Wt Readings from Last 3 Encounters:  12/17/19 152 lb (  68.9 kg)  12/14/19 155 lb (70.3 kg)  11/03/19 154 lb (69.9 kg)    BP 128/78   Pulse 60   Ht 5\' 8"  (1.727 m)   Wt 152 lb (68.9 kg)   BMI 23.11 kg/m   Assessment and Plan:  1. Healthcare maintenance Patient is stable and just needs baseline labs today complaints will continue lipid control with diet and will check lipid panel to see if this will be sufficient. - Lipid Panel With LDL/HDL Ratio  2. Essential hypertension Chronic.  Controlled.  Stable.  Continue hydrochlorothiazide 12.5 mg once a day.  Objective urinalysis and there was no proteinuria noted renal function panel today for evaluation of electrolytes and GFR. - Renal Function Panel - POCT urinalysis dipstick - hydrochlorothiazide (HYDRODIURIL) 12.5 MG tablet; Take 1 tablet (12.5 mg total) by mouth daily.  Dispense: 90 tablet; Refill: 1  3. 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  Urinalysis notes no infection at this time. - POCT urinalysis dipstick

## 2019-12-18 LAB — LIPID PANEL WITH LDL/HDL RATIO
Cholesterol, Total: 191 mg/dL (ref 100–199)
HDL: 79 mg/dL (ref >39)
LDL Chol Calc (NIH): 100 mg/dL — ABNORMAL HIGH (ref 0–99)
LDL/HDL Ratio: 1.3 ratio (ref 0.0–3.2)
Triglycerides: 67 mg/dL (ref 0–149)
VLDL Cholesterol Cal: 12 mg/dL (ref 5–40)

## 2019-12-18 LAB — RENAL FUNCTION PANEL
Albumin: 4.2 g/dL (ref 3.7–4.7)
BUN/Creatinine Ratio: 29 — ABNORMAL HIGH (ref 12–28)
BUN: 19 mg/dL (ref 8–27)
CO2: 27 mmol/L (ref 20–29)
Calcium: 9.4 mg/dL (ref 8.7–10.3)
Chloride: 101 mmol/L (ref 96–106)
Creatinine, Ser: 0.66 mg/dL (ref 0.57–1.00)
GFR calc Af Amer: 103 mL/min/{1.73_m2} (ref >59)
GFR calc non Af Amer: 89 mL/min/{1.73_m2} (ref >59)
Glucose: 96 mg/dL (ref 65–99)
Phosphorus: 3.6 mg/dL (ref 3.0–4.3)
Potassium: 4.2 mmol/L (ref 3.5–5.2)
Sodium: 140 mmol/L (ref 134–144)

## 2020-01-03 ENCOUNTER — Telehealth: Payer: Self-pay | Admitting: Family Medicine

## 2020-01-03 NOTE — Telephone Encounter (Signed)
That's were she wanted [place] to get colonoscopy info   Copied from CRM [id number]. Topic: General - Inquiry >> Jan 03, 2020  2:36 PM Crist Infante wrote: Reason for CRM: Dr Ephriam Knuckles office called to let you know this pt has never been seen there.  They got a request for Medical Records from you.

## 2020-02-02 ENCOUNTER — Other Ambulatory Visit: Payer: Self-pay

## 2020-02-02 ENCOUNTER — Encounter (INDEPENDENT_AMBULATORY_CARE_PROVIDER_SITE_OTHER): Payer: Self-pay | Admitting: Vascular Surgery

## 2020-02-02 ENCOUNTER — Ambulatory Visit (INDEPENDENT_AMBULATORY_CARE_PROVIDER_SITE_OTHER): Payer: Medicare Other | Admitting: Vascular Surgery

## 2020-02-02 VITALS — BP 145/82 | HR 78 | Ht 68.0 in | Wt 155.0 lb

## 2020-02-02 DIAGNOSIS — I83813 Varicose veins of bilateral lower extremities with pain: Secondary | ICD-10-CM | POA: Diagnosis not present

## 2020-02-02 NOTE — Progress Notes (Signed)
Varicose veins of left lower extremity with inflammation (454.1  I83.10) Current Plans   Indication: Patient presents with symptomatic varicose veins of the left lower extremity.   Procedure: Sclerotherapy using hypertonic saline mixed with 1% Lidocaine was performed on the left lower extremity. Compression wraps were placed. The patient tolerated the procedure well. 

## 2020-03-01 ENCOUNTER — Ambulatory Visit (INDEPENDENT_AMBULATORY_CARE_PROVIDER_SITE_OTHER): Payer: Medicare Other | Admitting: Vascular Surgery

## 2020-03-01 ENCOUNTER — Other Ambulatory Visit: Payer: Self-pay

## 2020-03-01 ENCOUNTER — Encounter (INDEPENDENT_AMBULATORY_CARE_PROVIDER_SITE_OTHER): Payer: Self-pay | Admitting: Vascular Surgery

## 2020-03-01 VITALS — BP 139/84 | HR 88 | Resp 16 | Wt 156.0 lb

## 2020-03-01 DIAGNOSIS — I83813 Varicose veins of bilateral lower extremities with pain: Secondary | ICD-10-CM | POA: Diagnosis not present

## 2020-03-01 NOTE — Progress Notes (Signed)
Varicose veins of left lower extremity with inflammation (454.1  I83.10) Current Plans   Indication: Patient presents with symptomatic varicose veins of the left lower extremity.   Procedure: Sclerotherapy using hypertonic saline mixed with 1% Lidocaine was performed on the left lower extremity. Compression wraps were placed. The patient tolerated the procedure well. 

## 2020-03-12 ENCOUNTER — Other Ambulatory Visit: Payer: Self-pay | Admitting: Family Medicine

## 2020-03-12 NOTE — Telephone Encounter (Signed)
Requested Prescriptions  Pending Prescriptions Disp Refills   amLODipine (NORVASC) 5 MG tablet [Pharmacy Med Name: AMLODIPINE BESYLATE 5 MG TAB] 90 tablet 0    Sig: TAKE 1 TABLET BY MOUTH EVERY DAY     Cardiovascular:  Calcium Channel Blockers Passed - 03/12/2020  9:22 AM      Passed - Last BP in normal range    BP Readings from Last 1 Encounters:  03/01/20 139/84         Passed - Valid encounter within last 6 months    Recent Outpatient Visits          2 months ago Healthcare maintenance   [facility] Duanne Limerick, MD   4 months ago Lumbar pain   [facility] Duanne Limerick, MD   5 months ago Encounter for medical examination to establish care   [facility] Duanne Limerick, MD

## 2020-03-20 ENCOUNTER — Ambulatory Visit (INDEPENDENT_AMBULATORY_CARE_PROVIDER_SITE_OTHER): Payer: Medicare Other

## 2020-03-20 DIAGNOSIS — Z0000 Encounter for general adult medical examination without abnormal findings: Secondary | ICD-10-CM

## 2020-03-20 NOTE — Progress Notes (Signed)
Subjective:   Debbie Montgomery is a 74 y.o. female who presents for an Initial Medicare Annual Wellness Visit.  Virtual Visit via Telephone Note  I connected with  Debbie Montgomery on 03/20/20 at  2:40 PM EDT by telephone and verified that I am speaking with the correct person using two identifiers.  Medicare Annual Wellness visit completed telephonically due to Covid-19 pandemic.   Location: Patient: home Provider: [facility]   I discussed the limitations, risks, security and privacy concerns of performing an evaluation and management service by telephone and the availability of in person appointments. The patient expressed understanding and agreed to proceed.  Unable to perform video visit due to video visit attempted and failed and/or patient does not have video capability.   Some vital signs may be absent or patient reported.   [name], LPN    Review of Systems     Cardiac Risk Factors include: advanced age (>52men, >27 women);hypertension     Objective:    There were no vitals filed for this visit. There is no height or weight on file to calculate BMI.  Advanced Directives 03/20/2020  Does Patient Have a Medical Advance Directive? No  Would patient like information on creating a medical advance directive? No - Patient declined    Current Medications (verified) Outpatient Encounter Medications as of 03/20/2020  Medication Sig  . amLODipine (NORVASC) 5 MG tablet TAKE 1 TABLET BY MOUTH EVERY DAY  . Bioflavonoid Products (ESTER C PO) Take by mouth.  . COLLAGEN PO Take 18,000 each by mouth daily.  Marland Kitchen FLUoxetine (PROZAC) 10 MG capsule Take 30 mg by mouth daily.  Marland Kitchen GARCINIA CAMBOGIA-CHROMIUM PO Take 1 each by mouth in the morning and at bedtime.  . hydrochlorothiazide (HYDRODIURIL) 12.5 MG tablet Take 1 tablet (12.5 mg total) by mouth daily.  . meloxicam (MOBIC) 15 MG tablet Take 15 mg by mouth daily.  . Multiple Vitamins-Minerals (OSTEO COMPLEX PO) Take 1  capsule by mouth daily.  Marland Kitchen OVER THE COUNTER MEDICATION Cramp defense  . predniSONE (DELTASONE) 10 MG tablet Take by mouth. Take 4 tablets (40 mg total) by mouth daily for 4 days, THEN 3 tablets (30 mg total) daily for 4 days, THEN 2 tablets (20 mg total) daily for 4 days, THEN 1 tablet (10 mg total) daily for 4 days.  Marland Kitchen VITAMIN E-400 PO Take 2 tablets by mouth daily.  . [DISCONTINUED] APPLE CIDER VINEGAR PO Take by mouth.  . [DISCONTINUED] TURMERIC PO Take 2,000 each by mouth daily.   No facility-administered encounter medications on file as of 03/20/2020.    Allergies (verified) Patient has no known allergies.   History: Past Medical History:  Diagnosis Date  . Hypertension    Past Surgical History:  Procedure Laterality Date  . ABDOMINAL HYSTERECTOMY    . bladder lift  1995  . COLONOSCOPY    . EYE SURGERY     lasix  . LASER ABLATION Right   . TONSILLECTOMY AND ADENOIDECTOMY    . TUBAL LIGATION     Family History  Problem Relation Age of Onset  . Hypertension Mother   . Heart disease Father   . Heart attack Maternal Grandmother   . Stroke Maternal Grandmother    Social History   Socioeconomic History  . Marital status: Married    Spouse name: Not on file  . Number of children: Not on file  . Years of education: Not on file  . Highest education level: Not on file  Occupational History  . Not on file  Tobacco Use  . Smoking status: Former Games developer  . Smokeless tobacco: Never Used  Vaping Use  . Vaping Use: Never used  Substance and Sexual Activity  . Alcohol use: Yes    Comment: ocassionally  . Drug use: Never  . Sexual activity: Not Currently  Other Topics Concern  . Not on file  Social History Narrative  . Not on file   Social Determinants of Health   Financial Resource Strain: Low Risk   . Difficulty of Paying Living Expenses: Not hard at all  Food Insecurity: No Food Insecurity  . Worried About Programme researcher, broadcasting/film/video in the Last Year: Never true  . Ran  Out of Food in the Last Year: Never true  Transportation Needs: No Transportation Needs  . Lack of Transportation (Medical): No  . Lack of Transportation (Non-Medical): No  Physical Activity: Sufficiently Active  . Days of Exercise per Week: 3 days  . Minutes of Exercise per Session: 60 min  Stress: No Stress Concern Present  . Feeling of Stress : Not at all  Social Connections: Unknown  . Frequency of Communication with Friends and Family: Not on file  . Frequency of Social Gatherings with Friends and Family: Not on file  . Attends Religious Services: Not on file  . Active Member of Clubs or Organizations: Not on file  . Attends Banker Meetings: Not on file  . Marital Status: Married    Tobacco Counseling Counseling given: Not Answered   Clinical Intake:  Pre-visit preparation completed: Yes  Pain : No/denies pain     Diabetes: No  How often do you need to have someone help you when you read instructions, pamphlets, or other written materials from your doctor or pharmacy?: 1 - Never  DiabeticInterpreter Needed?: No  Information entered by :: [name] LPN   Activities of Daily Living In your present state of health, do you have any difficulty performing the following activities: 03/20/2020  Hearing? N  Comment declines hearing aids  Vision? N  Difficulty concentrating or making decisions? N  Walking or climbing stairs? N  Dressing or bathing? N  Doing errands, shopping? N  Preparing Food and eating ? N  Using the Toilet? N  In the past six months, have you accidently leaked urine? N  Do you have problems with loss of bowel control? N  Managing your Medications? N  Managing your Finances? N  Housekeeping or managing your Housekeeping? N    Patient Care Team: Duanne Limerick, MD as PCP - General (Family Medicine)  Indicate any recent Medical Services you may have received from other than [facility] providers in the past year (date may be  approximate).     Assessment:   This is a routine wellness examination for [name].  Hearing/Vision screen  Hearing Screening   125Hz  250Hz  500Hz  1000Hz  2000Hz  3000Hz  4000Hz  6000Hz  8000Hz   Right ear:           Left ear:           Comments: Pt denies hearing difficulty  Vision Screening Comments: Annual vision screenings done in  Dietary issues and exercise activities discussed: Current Exercise Habits: Structured exercise class, Type of exercise: strength training/weights, Time (Minutes): 60, Frequency (Times/Week): 3, Weekly Exercise (Minutes/Week): 180, Intensity: Moderate, Exercise limited by: None identified  Goals   None    Depression Screen PHQ 2/9 Scores 03/20/2020 12/17/2019 11/03/2019 09/21/2019  PHQ - 2 Score  0 0 0 0  PHQ- 9 Score - 0 0 2    Fall Risk Fall Risk  03/20/2020 12/17/2019 11/03/2019 09/21/2019  Falls in the past year? 0 0 0 0  Number falls in past yr: 0 - - -  Injury with Fall? 0 - - -  Risk for fall due to : No Fall Risks - - -  Follow up Falls prevention discussed Falls evaluation completed Falls evaluation completed Falls evaluation completed    Any stairs in or around the home? Yes  If so, are there any without handrails? No  Home free of loose throw rugs in walkways, pet beds, electrical cords, etc? Yes  Adequate lighting in your home to reduce risk of falls? Yes   ASSISTIVE DEVICES UTILIZED TO PREVENT FALLS:  Life alert? No  Use of a cane, walker or w/c? No  Grab bars in the bathroom? No  Shower chair or bench in shower? Yes Elevated toilet seat or a handicapped toilet? Yes   TIMED UP AND GO:  Was the test performed? No . Telephonic visit  Cognitive Function: 6CIT deferred for 2021 AWV; pt states no memory         Immunizations Immunization History  Administered Date(s) Administered  . Influenza-Unspecified 02/20/2020  . Moderna SARS-COVID-2 Vaccination 07/13/2019, 08/03/2019  . Pneumococcal Conjugate-13 06/03/2016  . Tdap  06/04/2015    TDAP status: Up to date   Flu Vaccine status: Up to date    Pneumococcal vaccines status: due for PPSV23  Covid-19 vaccine status: Completed vaccines  Qualifies for Shingles Vaccine? Yes   Zostavax completed No   Shingrix Completed?: No.    Education has been provided regarding the importance of this vaccine. Patient has been advised to call insurance company to determine out of pocket expense if they have not yet received this vaccine. Advised may also receive vaccine at local pharmacy or Health Dept. Verbalized acceptance and understanding.  Screening Tests Health Maintenance  Topic Date Due  . PNA vac Low Risk Adult (2 of 2 - PPSV23) 06/03/2017  . Hepatitis C Screening  09/20/2020 (Originally Feb 18, 1946)  . MAMMOGRAM  11/08/2021  . TETANUS/TDAP  06/03/2025  . COLONOSCOPY  06/04/2027  . INFLUENZA VACCINE  Completed  . DEXA SCAN  Completed  . COVID-19 Vaccine  Completed    Health Maintenance  Health Maintenance Due  Topic Date Due  . PNA vac Low Risk Adult (2 of 2 - PPSV23) 06/03/2017    Colorectal cancer screening: Completed 2019. Repeat every as need per provider years   Mammogram status: Completed 11/09/19. Repeat every year   Bone Density status: Completed 11/04/19. Results reflect: Bone density results: OSTEOPOROSIS. Repeat every 2 years.  Lung Cancer Screening: (Low Dose CT Chest recommended if Age 103-80 years, 30 pack-year currently smoking OR have quit w/in 15years.) does not qualify.    Additional Screening:  Hepatitis C Screening: does qualify; postponed  Vision Screening: Recommended annual ophthalmology exams for early detection of glaucoma and other disorders of the eye. Is the patient up to date with their annual eye exam?  Yes  Who is the provider or what is the name of the office in which the patient attends annual eye exams? Eye provider in [place]  Dental Screening: Recommended annual dental exams for proper oral hygiene  Community  Resource Referral / Chronic Care Management: CRR required this visit?  No   CCM required this visit?  No      Plan:     I  have personally reviewed and noted the following in the patient's chart:   . Medical and social history . Use of alcohol, tobacco or illicit drugs  . Current medications and supplements . Functional ability and status . Nutritional status . Physical activity . Advanced directives . List of other physicians . Hospitalizations, surgeries, and ER visits in previous 12 months . Vitals . Screenings to include cognitive, depression, and falls . Referrals and appointments  In addition, I have reviewed and discussed with patient certain preventive protocols, quality metrics, and best practice recommendations. A written personalized care plan for preventive services as well as general preventive health recommendations were provided to patient.     Reather LittlerKasey Larkyn Greenberger, LPN   [id number]/960410/18/2021   Nurse Notes: pt c/o urinary issues with possible UTI and patient would like to discuss repeat colonoscopy recommendations with Dr. Yetta BarreJones; pt aware last provider given for record request was incorrect and she will sign request for records at next appt.

## 2020-03-20 NOTE — Patient Instructions (Signed)
Debbie Montgomery , Thank you for taking time to come for your Medicare Wellness Visit. I appreciate your ongoing commitment to your health goals. Please review the following plan we discussed and let me know if I can assist you in the future.   Screening recommendations/referrals: Colonoscopy: done 2019 Mammogram: done 11/09/19 Bone Density: done 11/04/19 Recommended yearly ophthalmology/optometry visit for glaucoma screening and checkup Recommended yearly dental visit for hygiene and checkup  Vaccinations: Influenza vaccine: done 02/20/20 Pneumococcal vaccine: done 2018 due for Pneumovax 23 Tdap vaccine: done 2017 Shingles vaccine: Shingrix discussed. Please contact your pharmacy for coverage information.  Covid-19: done 07/13/19 & 08/03/19  Advanced directives: Advance directive discussed with you today. Even though you declined this today please call our office should you change your mind and we can give you the proper paperwork for you to fill out.  Conditions/risks identified: Keep up the great work!  Next appointment: Follow up in one year for your annual wellness visit    Preventive Care 72 Years and Older, Female Preventive care refers to lifestyle choices and visits with your health care provider that can promote health and wellness. What does preventive care include?  A yearly physical exam. This is also called an annual well check.  Dental exams once or twice a year.  Routine eye exams. Ask your health care provider how often you should have your eyes checked.  Personal lifestyle choices, including:  Daily care of your teeth and gums.  Regular physical activity.  Eating a healthy diet.  Avoiding tobacco and drug use.  Limiting alcohol use.  Practicing safe sex.  Taking low-dose aspirin every day.  Taking vitamin and mineral supplements as recommended by your health care provider. What happens during an annual well check? The services and screenings done by your  health care provider during your annual well check will depend on your age, overall health, lifestyle risk factors, and family history of disease. Counseling  Your health care provider may ask you questions about your:  Alcohol use.  Tobacco use.  Drug use.  Emotional well-being.  Home and relationship well-being.  Sexual activity.  Eating habits.  History of falls.  Memory and ability to understand (cognition).  Work and work Astronomer.  Reproductive health. Screening  You may have the following tests or measurements:  Height, weight, and BMI.  Blood pressure.  Lipid and cholesterol levels. These may be checked every 5 years, or more frequently if you are over 72 years old.  Skin check.  Lung cancer screening. You may have this screening every year starting at age 72 if you have a 30-pack-year history of smoking and currently smoke or have quit within the past 15 years.  Fecal occult blood test (FOBT) of the stool. You may have this test every year starting at age 72.  Flexible sigmoidoscopy or colonoscopy. You may have a sigmoidoscopy every 5 years or a colonoscopy every 10 years starting at age 72.  Hepatitis C blood test.  Hepatitis B blood test.  Sexually transmitted disease (STD) testing.  Diabetes screening. This is done by checking your blood sugar (glucose) after you have not eaten for a while (fasting). You may have this done every 1-3 years.  Bone density scan. This is done to screen for osteoporosis. You may have this done starting at age 21.  Mammogram. This may be done every 1-2 years. Talk to your health care provider about how often you should have regular mammograms. Talk with your health care provider  about your test results, treatment options, and if necessary, the need for more tests. Vaccines  Your health care provider may recommend certain vaccines, such as:  Influenza vaccine. This is recommended every year.  Tetanus, diphtheria, and  acellular pertussis (Tdap, Td) vaccine. You may need a Td booster every 10 years.  Zoster vaccine. You may need this after age 26.  Pneumococcal 13-valent conjugate (PCV13) vaccine. One dose is recommended after age 72.  Pneumococcal polysaccharide (PPSV23) vaccine. One dose is recommended after age 72. Talk to your health care provider about which screenings and vaccines you need and how often you need them. This information is not intended to replace advice given to you by your health care provider. Make sure you discuss any questions you have with your health care provider. Document Released: 06/16/2015 Document Revised: 02/07/2016 Document Reviewed: 03/21/2015 Elsevier Interactive Patient Education  2017 [place] Prevention in the Home Falls can cause injuries. They can happen to people of all ages. There are many things you can do to make your home safe and to help prevent falls. What can I do on the outside of my home?  Regularly fix the edges of walkways and driveways and fix any cracks.  Remove anything that might make you trip as you walk through a door, such as a raised step or threshold.  Trim any bushes or trees on the path to your home.  Use bright outdoor lighting.  Clear any walking paths of anything that might make someone trip, such as rocks or tools.  Regularly check to see if handrails are loose or broken. Make sure that both sides of any steps have handrails.  Any raised decks and porches should have guardrails on the edges.  Have any leaves, snow, or ice cleared regularly.  Use sand or salt on walking paths during winter.  Clean up any spills in your garage right away. This includes oil or grease spills. What can I do in the bathroom?  Use night lights.  Install grab bars by the toilet and in the tub and shower. Do not use towel bars as grab bars.  Use non-skid mats or decals in the tub or shower.  If you need to sit down in the shower, use  a plastic, non-slip stool.  Keep the floor dry. Clean up any water that spills on the floor as soon as it happens.  Remove soap buildup in the tub or shower regularly.  Attach bath mats securely with double-sided non-slip rug tape.  Do not have throw rugs and other things on the floor that can make you trip. What can I do in the bedroom?  Use night lights.  Make sure that you have a light by your bed that is easy to reach.  Do not use any sheets or blankets that are too big for your bed. They should not hang down onto the floor.  Have a firm chair that has side arms. You can use this for support while you get dressed.  Do not have throw rugs and other things on the floor that can make you trip. What can I do in the kitchen?  Clean up any spills right away.  Avoid walking on wet floors.  Keep items that you use a lot in easy-to-reach places.  If you need to reach something above you, use a strong step stool that has a grab bar.  Keep electrical cords out of the way.  Do not use floor polish or  wax that makes floors slippery. If you must use wax, use non-skid floor wax.  Do not have throw rugs and other things on the floor that can make you trip. What can I do with my stairs?  Do not leave any items on the stairs.  Make sure that there are handrails on both sides of the stairs and use them. Fix handrails that are broken or loose. Make sure that handrails are as long as the stairways.  Check any carpeting to make sure that it is firmly attached to the stairs. Fix any carpet that is loose or worn.  Avoid having throw rugs at the top or bottom of the stairs. If you do have throw rugs, attach them to the floor with carpet tape.  Make sure that you have a light switch at the top of the stairs and the bottom of the stairs. If you do not have them, ask someone to add them for you. What else can I do to help prevent falls?  Wear shoes that:  Do not have high heels.  Have  rubber bottoms.  Are comfortable and fit you well.  Are closed at the toe. Do not wear sandals.  If you use a stepladder:  Make sure that it is fully opened. Do not climb a closed stepladder.  Make sure that both sides of the stepladder are locked into place.  Ask someone to hold it for you, if possible.  Clearly mark and make sure that you can see:  Any grab bars or handrails.  First and last steps.  Where the edge of each step is.  Use tools that help you move around (mobility aids) if they are needed. These include:  Canes.  Walkers.  Scooters.  Crutches.  Turn on the lights when you go into a dark area. Replace any light bulbs as soon as they burn out.  Set up your furniture so you have a clear path. Avoid moving your furniture around.  If any of your floors are uneven, fix them.  If there are any pets around you, be aware of where they are.  Review your medicines with your doctor. Some medicines can make you feel dizzy. This can increase your chance of falling. Ask your doctor what other things that you can do to help prevent falls. This information is not intended to replace advice given to you by your health care provider. Make sure you discuss any questions you have with your health care provider. Document Released: 03/16/2009 Document Revised: 10/26/2015 Document Reviewed: 06/24/2014 Elsevier Interactive Patient Education  2017 [employer].

## 2020-03-23 ENCOUNTER — Encounter: Payer: Self-pay | Admitting: Family Medicine

## 2020-03-23 ENCOUNTER — Other Ambulatory Visit: Payer: Self-pay

## 2020-03-23 ENCOUNTER — Ambulatory Visit (INDEPENDENT_AMBULATORY_CARE_PROVIDER_SITE_OTHER): Payer: Medicare Other | Admitting: Family Medicine

## 2020-03-23 VITALS — BP 120/80 | HR 80 | Ht 68.0 in | Wt 158.0 lb

## 2020-03-23 DIAGNOSIS — R519 Headache, unspecified: Secondary | ICD-10-CM | POA: Diagnosis not present

## 2020-03-23 DIAGNOSIS — I10 Essential (primary) hypertension: Secondary | ICD-10-CM | POA: Diagnosis not present

## 2020-03-23 DIAGNOSIS — N3000 Acute cystitis without hematuria: Secondary | ICD-10-CM | POA: Diagnosis not present

## 2020-03-23 DIAGNOSIS — G8929 Other chronic pain: Secondary | ICD-10-CM

## 2020-03-23 DIAGNOSIS — R350 Frequency of micturition: Secondary | ICD-10-CM

## 2020-03-23 LAB — POCT URINALYSIS DIPSTICK
Bilirubin, UA: NEGATIVE
Blood, UA: NEGATIVE
Glucose, UA: NEGATIVE
Ketones, UA: NEGATIVE
Nitrite, UA: POSITIVE
Protein, UA: NEGATIVE
Spec Grav, UA: 1.02
Urobilinogen, UA: 0.2 U/dL
pH, UA: 6

## 2020-03-23 MED ORDER — CEPHALEXIN 500 MG PO CAPS: 500.0000 mg | ORAL_CAPSULE | Freq: Three times a day (TID) | ORAL | 0 refills | Status: DC

## 2020-03-23 MED ORDER — AMLODIPINE BESYLATE 2.5 MG PO TABS
2.5000 mg | ORAL_TABLET | Freq: Every day | ORAL | 0 refills | Status: DC
Start: 2020-03-23 — End: 2020-05-11

## 2020-03-23 NOTE — Progress Notes (Signed)
Date:  03/23/2020   Name:  Debbie Montgomery [facility]   DOB:  19-Aug-1945   MRN:  [id number]   Chief Complaint: Urinary Tract Infection and Headache (daily)  Headache  This is a chronic problem. The current episode started more than 1 year ago. The problem occurs monthly. The problem has been unchanged. The pain is located in the retro-orbital region. The quality of the pain is described as aching. The pain is mild. Pertinent negatives include no abdominal pain, back pain, coughing, dizziness, ear pain, eye pain, eye redness, eye watering, fever, loss of balance, nausea, numbness, phonophobia, photophobia, rhinorrhea, sinus pressure, sore throat, tingling, visual change, vomiting or weakness. The treatment provided moderate relief. Her past medical history is significant for migraine headaches.  Urinary Frequency  This is a chronic problem. The current episode started more than 1 year ago. The problem occurs intermittently. The problem has been gradually worsening. Associated symptoms include frequency and urgency. Pertinent negatives include no chills, discharge, flank pain, hematuria, hesitancy, nausea or vomiting. Associated symptoms comments: Incomplete emptying/urologic surgery for stress urinary incontinence. Her past medical history is significant for a urological procedure. There is no history of recurrent UTIs.    Lab Results  Component Value Date   CREATININE 0.66 12/17/2019   BUN 19 12/17/2019   NA 140 12/17/2019   K 4.2 12/17/2019   CL 101 12/17/2019   CO2 27 12/17/2019   Lab Results  Component Value Date   CHOL 191 12/17/2019   HDL 79 12/17/2019   LDLCALC 100 (H) 12/17/2019   TRIG 67 12/17/2019   Lab Results  Component Value Date   TSH 1.25 06/25/2019   No results found for: HGBA1C No results found for: WBC, HGB, HCT, MCV, PLT Lab Results  Component Value Date   ALT 11 05/20/2018   AST 16 05/20/2018     Review of Systems  Constitutional: Negative.  Negative for  chills, fatigue, fever and unexpected weight change.  HENT: Negative for congestion, ear discharge, ear pain, postnasal drip, rhinorrhea, sinus pressure, sneezing and sore throat.   Eyes: Negative for photophobia, pain, discharge, redness and itching.  Respiratory: Negative for cough, shortness of breath, wheezing and stridor.   Cardiovascular: Negative for chest pain, palpitations and leg swelling.  Gastrointestinal: Negative for abdominal pain, blood in stool, constipation, diarrhea, nausea and vomiting.  Endocrine: Negative for cold intolerance, heat intolerance, polydipsia, polyphagia and polyuria.  Genitourinary: Positive for frequency and urgency. Negative for dysuria, flank pain, hematuria, hesitancy, menstrual problem, pelvic pain, vaginal bleeding and vaginal discharge.  Musculoskeletal: Negative for arthralgias, back pain and myalgias.  Skin: Negative for rash.  Allergic/Immunologic: Negative for environmental allergies and food allergies.  Neurological: Positive for headaches. Negative for dizziness, tingling, weakness, light-headedness, numbness and loss of balance.  Hematological: Negative for adenopathy. Does not bruise/bleed easily.  Psychiatric/Behavioral: Negative for dysphoric mood. The patient is not nervous/anxious.     Patient Active Problem List   Diagnosis Date Noted  . Family history of melanoma 12/16/2019  . 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 High blood pressure 07/01/2008    No Known Allergies  Past Surgical History:  Procedure Laterality Date  . ABDOMINAL HYSTERECTOMY    . bladder lift  1995  . COLONOSCOPY    . EYE SURGERY     lasix  . LASER ABLATION Right   . TONSILLECTOMY AND ADENOIDECTOMY    . TUBAL LIGATION      Social History   Tobacco  Use  . Smoking status: Former Smoker  . Smokeless tobacco: Never Used  Vaping Use  . Vaping Use: Never used  Substance Use Topics  . Alcohol use: Yes    Comment: ocassionally  . Drug  use: Never     Medication list has been reviewed and updated.  Current Meds  Medication Sig  . amLODipine (NORVASC) 5 MG tablet TAKE 1 TABLET BY MOUTH EVERY DAY  . Bioflavonoid Products (ESTER C PO) Take by mouth.  . COLLAGEN PO Take 18,000 each by mouth daily.  Marland Kitchen FLUoxetine (PROZAC) 10 MG capsule Take 30 mg by mouth daily.  Marland Kitchen GARCINIA CAMBOGIA-CHROMIUM PO Take 1 each by mouth in the morning and at bedtime.  . hydrochlorothiazide (HYDRODIURIL) 12.5 MG tablet Take 1 tablet (12.5 mg total) by mouth daily.  . meloxicam (MOBIC) 15 MG tablet Take 15 mg by mouth daily.  . Multiple Vitamins-Minerals (OSTEO COMPLEX PO) Take 1 capsule by mouth daily.  Marland Kitchen OVER THE COUNTER MEDICATION Cramp defense  . predniSONE (DELTASONE) 10 MG tablet Take by mouth. Take 4 tablets (40 mg total) by mouth daily for 4 days, THEN 3 tablets (30 mg total) daily for 4 days, THEN 2 tablets (20 mg total) daily for 4 days, THEN 1 tablet (10 mg total) daily for 4 days.  . valACYclovir (VALTREX) 1000 MG tablet Take 1,000 mg by mouth daily. Dr Corley/ Derm  . VITAMIN E-400 PO Take 2 tablets by mouth daily.    PHQ 2/9 Scores 03/20/2020 12/17/2019 11/03/2019 09/21/2019  PHQ - 2 Score 0 0 0 0  PHQ- 9 Score - 0 0 2    GAD 7 : Generalized Anxiety Score 12/17/2019 11/03/2019 09/21/2019  Nervous, Anxious, on Edge 0 0 0  Control/stop worrying 0 0 0  Worry too much - different things 0 0 0  Trouble relaxing 0 0 0  Restless 0 0 0  Easily annoyed or irritable 0 0 0  Afraid - awful might happen 0 0 0  Total GAD 7 Score 0 0 0    BP Readings from Last 3 Encounters:  03/23/20 120/80  03/01/20 139/84  02/02/20 (!) 145/82    Physical Exam Vitals and nursing note reviewed.  Constitutional:      General: She is not in acute distress.    Appearance: She is not diaphoretic.  HENT:     Head: Normocephalic and atraumatic.     Right Ear: External ear normal.     Left Ear: External ear normal.     Nose: Nose normal.  Eyes:      General: No visual field deficit.       Right eye: No discharge.        Left eye: No discharge.     Extraocular Movements: Extraocular movements intact.     Right eye: Normal extraocular motion and no nystagmus.     Left eye: Normal extraocular motion and no nystagmus.     Conjunctiva/sclera: Conjunctivae normal.     Pupils: Pupils are equal, round, and reactive to light.     Right eye: Pupil is reactive.     Left eye: Pupil is reactive.  Neck:     Thyroid: No thyromegaly.     Vascular: No JVD.  Cardiovascular:     Rate and Rhythm: Normal rate and regular rhythm.     Heart sounds: Normal heart sounds. No murmur heard.  No friction rub. No gallop.   Pulmonary:     Effort: Pulmonary effort is normal.  Breath sounds: Normal breath sounds. No wheezing or rhonchi.  Abdominal:     General: Bowel sounds are normal.     Palpations: Abdomen is soft. There is no hepatomegaly, splenomegaly or mass.     Tenderness: There is abdominal tenderness in the suprapubic area. There is no right CVA tenderness, left CVA tenderness or guarding.  Musculoskeletal:        General: No swelling or tenderness. Normal range of motion.     Cervical back: Normal range of motion and neck supple.  Lymphadenopathy:     Cervical: No cervical adenopathy.  Skin:    General: Skin is warm and dry.     Capillary Refill: Capillary refill takes less than 2 seconds.     Coloration: Skin is not cyanotic or pale.     Findings: No erythema or rash.  Neurological:     Mental Status: She is alert.     Cranial Nerves: Cranial nerves are intact. No cranial nerve deficit, dysarthria or facial asymmetry.     Sensory: Sensation is intact.     Motor: Motor function is intact.     Deep Tendon Reflexes: Reflexes are normal and symmetric.     Reflex Scores:      Tricep reflexes are 2+ on the right side and 2+ on the left side.      Bicep reflexes are 2+ on the right side and 2+ on the left side.      Brachioradialis reflexes  are 2+ on the right side and 2+ on the left side.      Patellar reflexes are 2+ on the right side and 2+ on the left side.      Achilles reflexes are 2+ on the right side and 2+ on the left side.    Wt Readings from Last 3 Encounters:  03/23/20 158 lb (71.7 kg)  03/01/20 156 lb (70.8 kg)  02/02/20 155 lb (70.3 kg)    BP 120/80   Pulse 80   Ht 5\' 8"  (1.727 m)   Wt 158 lb (71.7 kg)   BMI 24.02 kg/m   Assessment and Plan:  1. Frequency of urination Patient has had a past history of urinary frequency.  This has recently increased over the past several months.  Patient states that she has had surgery what sounds like was for stress urinary incontinence in the past.  This has decreased in effectiveness over the past couple months as well urinalysis notes that there is a small amount of leukocytes noted which may suggest a UTI in early stages for which will be treated as noted below in the meantime we will refer to urology for symptoms letter suggesting that she is not fully emptying her bladder resulting in urgency and frequency. - Ambulatory referral to Urology - POCT urinalysis dipstick  2. Chronic nonintractable headache, unspecified headache type Patient has had a past history of headaches which are chronic in nature.  In the past she was treated with medications for blood pressure and blood pressure was elevated until she was put on amlodipine 5 mg along with her hydrochlorothiazide.  This is been doing well but patient was told that she may have headaches on the amlodipine.  Although I rather doubt that this is what is going on since patient was having headaches prior to being put on amlodipine several years ago.  The headaches have been ongoing and episodic in nature and we will have a referral to neurology to get a better history on the  type of headaches that this may represent.  In the meantime we will decrease her amlodipine to see if this has any positive causative effect on the  frequency of headaches. - Ambulatory referral to Neurology  3. Essential hypertension As noted above patient had difficulty getting blood pressure under control in the past until she was put on amlodipine by her previous physician.  We will decrease her amlodipine to 2.5 mg once a day and will recheck this in 6 weeks to see if this is sufficient.  4. Acute cystitis without hematuria As noted urinalysis there is rare WBCs with symptoms of frequency and urgency.  We will cover with cephalexin 500 mg 3 times a day for 3 days. - cephALEXin (KEFLEX) 500 MG capsule; Take 1 capsule (500 mg total) by mouth 3 (three) times daily.  Dispense: 10 capsule; Refill: 0

## 2020-03-27 NOTE — Progress Notes (Addendum)
03/28/2020 10:39 AM   Burman Nieves Poer 11-08-1945 [id number]  Referring provider: Duanne Limerick, MD [address] [place],  [place] [address] Chief Complaint  Patient presents with  . Urinary Frequency    HPI: Debbie Montgomery is a 74 y.o. female who presents today for evaluation and management of urinary frequency.   Patient saw her PCP last week and noted urinary frequency was a chronic issue. Symptoms started more than 1 year ago and were intermittent. She noted frequency gradually started to worsen. She had associated urgency, incomplete bladder emptying and urologic surgery for stress incontinence. Denies history of recurrent UTI. UA was nitrite positive with trace leukocytes otherwise negative. She was treated with Keflex 500 mg TID x 3 days.   Patient remains on Keflex. She has had lower back pain and pelvic pressure for a year now. She notes some bloating. She has issues initiating urinary stream. She feels like she does not empty her bladder. No burning with urination or hematuria. No flank pain. PVR is 17 mL.   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 She no longer takes tumeric after using it for 2-3 years. Once patient stopped tumeric IBS symptoms resolved. She has significantly changed her diet. She notes weight loss.   Patient is concerned about ovarian cancer.   PMH: Past Medical History:  Diagnosis Date  . Hypertension     Surgical History: Past Surgical History:  Procedure Laterality Date  . ABDOMINAL HYSTERECTOMY    . bladder lift  1995  . COLONOSCOPY    . EYE SURGERY     lasix  . LASER ABLATION Right   . TONSILLECTOMY AND ADENOIDECTOMY    . TUBAL LIGATION      Home Medications:  Allergies as of 03/28/2020   No Known Allergies     Medication List       Accurate as of March 28, 2020 11:59 PM. If you have any questions, ask your nurse or doctor.        amLODipine 2.5 MG tablet Commonly known as: NORVASC Take 1  tablet (2.5 mg total) by mouth daily.   cephALEXin 500 MG capsule Commonly known as: KEFLEX Take 1 capsule (500 mg total) by mouth 3 (three) times daily.   COLLAGEN PO Take 18,000 each by mouth daily.   ESTER C PO Take by mouth.   FLUoxetine 10 MG capsule Commonly known as: PROZAC Take 30 mg by mouth daily.   GARCINIA CAMBOGIA-CHROMIUM PO Take 1 each by mouth in the morning and at bedtime.   hydrochlorothiazide 12.5 MG tablet Commonly known as: HYDRODIURIL Take 1 tablet (12.5 mg total) by mouth daily.   meloxicam 15 MG tablet Commonly known as: MOBIC Take 15 mg by mouth daily.   OSTEO COMPLEX PO Take 1 capsule by mouth daily.   OVER THE COUNTER MEDICATION Cramp defense   predniSONE 10 MG tablet Commonly known as: DELTASONE Take by mouth. Take 4 tablets (40 mg total) by mouth daily for 4 days, THEN 3 tablets (30 mg total) daily for 4 days, THEN 2 tablets (20 mg total) daily for 4 days, THEN 1 tablet (10 mg total) daily for 4 days.   valACYclovir 1000 MG tablet Commonly known as: VALTREX Take 1,000 mg by mouth daily. Dr Corley/ Derm   VITAMIN E-400 PO Take 2 tablets by mouth daily.       Allergies: No Known Allergies  Family History: Family History  Problem Relation Age of Onset  .  Hypertension Mother   . Heart disease Father   . Heart attack Maternal Grandmother   . Stroke Maternal Grandmother     Social History:  reports that she has quit smoking. She has never used smokeless tobacco. She reports current alcohol use. She reports that she does not use drugs.   Physical Exam: BP 121/81   Pulse 80   Ht 5\' 8"  (1.727 m)   Wt 154 lb (69.9 kg)   BMI 23.42 kg/m   Constitutional:  Alert and oriented, No acute distress. HEENT: [place] AT, moist mucus membranes.  Trachea midline, no masses. Cardiovascular: No clubbing, cyanosis, or edema. Respiratory: Normal respiratory effort, no increased work of breathing. Skin: No rashes, bruises or suspicious  lesions. Neurologic: Grossly intact, no focal deficits, moving all 4 extremities. Psychiatric: Normal mood and affect.  Laboratory Data:  Lab Results  Component Value Date   CREATININE 0.66 12/17/2019    Urinalysis Results for orders placed or performed in visit on 03/28/20  Microscopic Examination   Urine  Result Value Ref Range   WBC, UA 6-10 (A) 0 - 5 /hpf   RBC 0-2 0 - 2 /hpf   Epithelial Cells (non renal) 0-10 0 - 10 /hpf   Renal Epithel, UA 0-10 (A) None seen /hpf   Mucus, UA Present (A) Not Estab.   Bacteria, UA Few None seen/Few   Yeast, UA Present (A) None seen  Urinalysis, Complete  Result Value Ref Range   Specific Gravity, UA 1.020 1.005 - 1.030   pH, UA 6.0 5.0 - 7.5   Color, UA Yellow Yellow   Appearance Ur Cloudy (A) Clear   Leukocytes,UA 2+ (A) Negative   Protein,UA Negative Negative/Trace   Glucose, UA Negative Negative   Ketones, UA Negative Negative   RBC, UA Trace (A) Negative   Bilirubin, UA Negative Negative   Urobilinogen, Ur 0.2 0.2 - 1.0 mg/dL   Nitrite, UA Negative Negative   Microscopic Examination See below:   BLADDER SCAN AMB NON-IMAGING  Result Value Ref Range   Scan Result 17 ML     Assessment & Plan:     1. Urinary frequency  UA today shows 6-10 WBC, nitrite negative, no RBC, few bacteria-no evidence of ongoing infection Adequate emptying with PVR 17 mL.  Finish Keflex course.  Suspect urinary frequency may be related to recent infection, continue antibiotics and if her frequency fails to improve, return She was worried about incomplete bladder emptying today and had multiple other concerns, spent some time reassuring.  At this point, she was reassured that she is no further microscopic hematuria and no other warning symptoms to suggest any significant pathology or need for more extensive work-up at this point.  Follow-up as needed   [facility] [address], [address] [place], [place] [phone]  I, [phone], am acting as a scribe for Dr. Theador Hawthorne.  I have reviewed the above documentation for accuracy and completeness, and I agree with the above.   Vanna Scotland, MD   She was provided with the following AVS instructions summarizing our visit today:  We discussed that if you are concerned about bloating and ovarian cancer, consider transvaginal ultrasound and tumor markers.  This can be ordered by your primary care physician.  You are emptying your bladder well today and there is no real concern for ongoing infection.  We will you should only take antibiotics if you are having UTI symptoms which include burning changes in  urgency or frequency.  We discussed starting a probiotic for your bowel issues.  You may consider something like align or taking yogurt every day but has a live active culture.

## 2020-03-28 ENCOUNTER — Other Ambulatory Visit: Payer: Self-pay

## 2020-03-28 ENCOUNTER — Ambulatory Visit (INDEPENDENT_AMBULATORY_CARE_PROVIDER_SITE_OTHER): Payer: Medicare Other | Admitting: Urology

## 2020-03-28 VITALS — BP 121/81 | HR 80 | Ht 68.0 in | Wt 154.0 lb

## 2020-03-28 DIAGNOSIS — R350 Frequency of micturition: Secondary | ICD-10-CM | POA: Diagnosis not present

## 2020-03-28 LAB — BLADDER SCAN AMB NON-IMAGING: Scan Result: 17

## 2020-03-28 NOTE — Patient Instructions (Signed)
We discussed that if you are concerned about bloating and ovarian cancer, consider transvaginal ultrasound and tumor markers.  This can be ordered by your primary care physician.  You are emptying your bladder well today and there is no real concern for ongoing infection.  We will you should only take antibiotics if you are having UTI symptoms which include burning changes in urgency or frequency.  We discussed starting a probiotic for your bowel issues.  You may consider something like align or taking yogurt every day but has a live active culture.  Vanna Scotland, MD

## 2020-03-29 ENCOUNTER — Ambulatory Visit (INDEPENDENT_AMBULATORY_CARE_PROVIDER_SITE_OTHER): Payer: Medicare Other | Admitting: Vascular Surgery

## 2020-03-29 LAB — MICROSCOPIC EXAMINATION

## 2020-03-29 LAB — URINALYSIS, COMPLETE
Bilirubin, UA: NEGATIVE
Glucose, UA: NEGATIVE
Ketones, UA: NEGATIVE
Nitrite, UA: NEGATIVE
Protein,UA: NEGATIVE
Specific Gravity, UA: 1.02 (ref 1.005–1.030)
Urobilinogen, Ur: 0.2 mg/dL (ref 0.2–1.0)
pH, UA: 6 (ref 5.0–7.5)

## 2020-04-04 ENCOUNTER — Telehealth: Payer: Self-pay

## 2020-04-04 NOTE — Telephone Encounter (Signed)
I called and left pt a message concerning- we received the out of [place] on her colonoscopy. It does not state whether it is repeat in 5 or 10 years due to polyps. I asked pt to call us back with that info.

## 2020-04-12 ENCOUNTER — Ambulatory Visit (INDEPENDENT_AMBULATORY_CARE_PROVIDER_SITE_OTHER): Payer: Medicare Other | Admitting: Vascular Surgery

## 2020-04-12 ENCOUNTER — Other Ambulatory Visit: Payer: Self-pay

## 2020-04-12 ENCOUNTER — Encounter (INDEPENDENT_AMBULATORY_CARE_PROVIDER_SITE_OTHER): Payer: Self-pay | Admitting: Vascular Surgery

## 2020-04-12 VITALS — BP 151/84 | HR 72 | Resp 16 | Wt 153.0 lb

## 2020-04-12 DIAGNOSIS — I83813 Varicose veins of bilateral lower extremities with pain: Secondary | ICD-10-CM

## 2020-04-12 NOTE — Progress Notes (Signed)
Varicose veins of bilateral  lower extremity with inflammation (454.1  I83.10) Current Plans   Indication: Patient presents with symptomatic varicose veins of the bilateral  lower extremity.   Procedure: Sclerotherapy using hypertonic saline mixed with 1% Lidocaine was performed on the bilateral lower extremity. Compression wraps were placed. The patient tolerated the procedure well. 

## 2020-05-02 DIAGNOSIS — R519 Headache, unspecified: Secondary | ICD-10-CM | POA: Insufficient documentation

## 2020-05-09 ENCOUNTER — Ambulatory Visit: Payer: Medicare Other | Admitting: Family Medicine

## 2020-05-11 ENCOUNTER — Encounter: Payer: Self-pay | Admitting: Family Medicine

## 2020-05-11 ENCOUNTER — Ambulatory Visit (INDEPENDENT_AMBULATORY_CARE_PROVIDER_SITE_OTHER): Payer: Medicare Other | Admitting: Family Medicine

## 2020-05-11 ENCOUNTER — Other Ambulatory Visit: Payer: Self-pay

## 2020-05-11 VITALS — BP 148/82 | HR 76 | Ht 68.0 in | Wt 157.0 lb

## 2020-05-11 DIAGNOSIS — I10 Essential (primary) hypertension: Secondary | ICD-10-CM

## 2020-05-11 MED ORDER — AMLODIPINE BESYLATE 5 MG PO TABS: 5.0000 mg | ORAL_TABLET | Freq: Every day | ORAL | 1 refills | Status: DC

## 2020-05-11 NOTE — Progress Notes (Signed)
Date:  05/11/2020   Name:  Debbie Montgomery [facility]   DOB:  1946/01/25   MRN:  [id number]   Chief Complaint: Follow-up (B/p)  Hypertension This is a chronic problem. The current episode started more than 1 year ago. The problem has been waxing and waning since onset. The problem is uncontrolled. Pertinent negatives include no anxiety, blurred vision, chest pain, headaches, malaise/fatigue, neck pain, orthopnea, palpitations, peripheral edema, PND, shortness of breath or sweats. There are no associated agents to hypertension. There are no known risk factors for coronary artery disease. Past treatments include calcium channel blockers and diuretics. The current treatment provides moderate improvement. There are no compliance problems.  There is no history of angina, kidney disease, CAD/MI, CVA, heart failure, left ventricular hypertrophy, PVD or retinopathy. There is no history of chronic renal disease, a hypertension causing med or renovascular disease.    Lab Results  Component Value Date   CREATININE 0.66 12/17/2019   BUN 19 12/17/2019   NA 140 12/17/2019   K 4.2 12/17/2019   CL 101 12/17/2019   CO2 27 12/17/2019   Lab Results  Component Value Date   CHOL 191 12/17/2019   HDL 79 12/17/2019   LDLCALC 100 (H) 12/17/2019   TRIG 67 12/17/2019   Lab Results  Component Value Date   TSH 1.25 06/25/2019   No results found for: HGBA1C No results found for: WBC, HGB, HCT, MCV, PLT Lab Results  Component Value Date   ALT 11 05/20/2018   AST 16 05/20/2018     Review of Systems  Constitutional: Negative.  Negative for chills, fatigue, fever, malaise/fatigue and unexpected weight change.  HENT: Negative for congestion, ear discharge, ear pain, rhinorrhea, sinus pressure, sneezing and sore throat.   Eyes: Negative for blurred vision, double vision, photophobia, pain, discharge, redness and itching.  Respiratory: Negative for cough, shortness of breath, wheezing and stridor.    Cardiovascular: Negative for chest pain, palpitations, orthopnea and PND.  Gastrointestinal: Negative for abdominal pain, blood in stool, constipation, diarrhea, nausea and vomiting.  Endocrine: Negative for cold intolerance, heat intolerance, polydipsia, polyphagia and polyuria.  Genitourinary: Negative for dysuria, flank pain, frequency, hematuria, menstrual problem, pelvic pain, urgency, vaginal bleeding and vaginal discharge.  Musculoskeletal: Negative for arthralgias, back pain, myalgias and neck pain.  Skin: Negative for rash.  Allergic/Immunologic: Negative for environmental allergies and food allergies.  Neurological: Negative for dizziness, weakness, light-headedness, numbness and headaches.  Hematological: Negative for adenopathy. Does not bruise/bleed easily.  Psychiatric/Behavioral: Negative for dysphoric mood. The patient is not nervous/anxious.     Patient Active Problem List   Diagnosis Date Noted  . Family history of melanoma 12/16/2019  . 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 High blood pressure 07/01/2008    No Known Allergies  Past Surgical History:  Procedure Laterality Date  . ABDOMINAL HYSTERECTOMY    . bladder lift  1995  . COLONOSCOPY    . EYE SURGERY     lasix  . LASER ABLATION Right   . TONSILLECTOMY AND ADENOIDECTOMY    . TUBAL LIGATION      Social History   Tobacco Use  . Smoking status: Former Games developer  . Smokeless tobacco: Never Used  Vaping Use  . Vaping Use: Never used  Substance Use Topics  . Alcohol use: Yes    Comment: ocassionally  . Drug use: Never     Medication list has been reviewed and updated.  Current Meds  Medication Sig  .  amLODipine (NORVASC) 2.5 MG tablet Take 1 tablet (2.5 mg total) by mouth daily.  Marland Kitchen Bioflavonoid Products (ESTER C PO) Take by mouth.  . COLLAGEN PO Take 18,000 each by mouth daily.  Marland Kitchen FLUoxetine (PROZAC) 10 MG capsule Take 30 mg by mouth daily.  . hydrochlorothiazide (HYDRODIURIL)  12.5 MG tablet Take 1 tablet (12.5 mg total) by mouth daily.  . meloxicam (MOBIC) 15 MG tablet Take 15 mg by mouth daily.  . Multiple Vitamins-Minerals (OSTEO COMPLEX PO) Take 1 capsule by mouth daily.  . naproxen (NAPROSYN) 250 MG tablet Take 250 mg by mouth as needed.  . nortriptyline (PAMELOR) 10 MG capsule Take 20 mg by mouth at bedtime. neuro  . OVER THE COUNTER MEDICATION Cramp defense  . tacrolimus (PROGRAF) 1 MG capsule Oral surgeon  . tacrolimus (PROTOPIC) 0.1 % ointment Apply topically 2 (two) times daily. derm  . valACYclovir (VALTREX) 1000 MG tablet Take 1,000 mg by mouth daily. Dr Corley/ Derm  . VITAMIN E-400 PO Take 2 tablets by mouth daily.    PHQ 2/9 Scores 03/20/2020 12/17/2019 11/03/2019 09/21/2019  PHQ - 2 Score 0 0 0 0  PHQ- 9 Score - 0 0 2    GAD 7 : Generalized Anxiety Score 12/17/2019 11/03/2019 09/21/2019  Nervous, Anxious, on Edge 0 0 0  Control/stop worrying 0 0 0  Worry too much - different things 0 0 0  Trouble relaxing 0 0 0  Restless 0 0 0  Easily annoyed or irritable 0 0 0  Afraid - awful might happen 0 0 0  Total GAD 7 Score 0 0 0    BP Readings from Last 3 Encounters:  05/11/20 (!) 148/82  04/12/20 (!) 151/84  03/28/20 121/81    Physical Exam Vitals and nursing note reviewed.  Constitutional:      Appearance: She is well-developed and well-nourished.  HENT:     Head: Normocephalic.     Right Ear: External ear normal.     Left Ear: External ear normal.     Nose: Nose normal.     Mouth/Throat:     Mouth: Oropharynx is clear and moist.  Eyes:     General: Lids are everted, no foreign bodies appreciated. No scleral icterus.       Left eye: No foreign body or hordeolum.     Extraocular Movements: EOM normal.     Conjunctiva/sclera: Conjunctivae normal.     Right eye: Right conjunctiva is not injected.     Left eye: Left conjunctiva is not injected.     Pupils: Pupils are equal, round, and reactive to light.  Neck:     Thyroid: No thyromegaly.      Vascular: No JVD.     Trachea: No tracheal deviation.  Cardiovascular:     Rate and Rhythm: Normal rate and regular rhythm.     Pulses: Intact distal pulses.     Heart sounds: Normal heart sounds. No murmur heard. No friction rub. No gallop.   Pulmonary:     Effort: Pulmonary effort is normal. No respiratory distress.     Breath sounds: Normal breath sounds. No wheezing, rhonchi or rales.  Abdominal:     General: Bowel sounds are normal.     Palpations: Abdomen is soft. There is no hepatosplenomegaly or mass.     Tenderness: There is no abdominal tenderness. There is no guarding or rebound.  Musculoskeletal:        General: No tenderness or edema. Normal range of motion.  Cervical back: Normal range of motion and neck supple.  Lymphadenopathy:     Cervical: No cervical adenopathy.  Skin:    General: Skin is warm.     Findings: No rash.  Neurological:     Mental Status: She is alert and oriented to person, place, and time.     Cranial Nerves: No cranial nerve deficit.     Deep Tendon Reflexes: Strength normal. Reflexes normal.  Psychiatric:        Mood and Affect: Mood and affect normal. Mood is not anxious or depressed.     Wt Readings from Last 3 Encounters:  05/11/20 157 lb (71.2 kg)  04/12/20 153 lb (69.4 kg)  03/28/20 154 lb (69.9 kg)    BP (!) 148/82   Pulse 76   Ht 5\' 8"  (1.727 m)   Wt 157 lb (71.2 kg)   BMI 23.87 kg/m   Assessment and Plan: 1. Essential hypertension Chronic.  Uncontrolled.  Stable.  Complicated.  Patient has been seen dermatologist oral surgeon for lichen planus and there was suggestions that it was her hypertensive medication that may affect cause this.  However the medication of concern hydrochlorothiazide was started long after the patient manifested her lichen planus.  We have decided to discontinue the hydrochlorothiazide so as not to complicate the current situation.  However patient thought that it was her amlodipine that he was  talking about and decreased the dose of this to 2.5 mg.  I have encouraged the patient to return to 5 mg of her original dosing of amlodipine and return in 6 to 8 weeks for to recheck her blood pressure.  In the meantime she will be having an appointment with her oral surgeon at which time we will discuss the discontinuance of her hydrochlorothiazide and if there is any other issues with other antihypertensive classes classes patient originally was on a combination of beta-blocker and ACE inhibitor it sounds like which did not decrease her blood pressures significantly and therefore this is why she ended up on amlodipine originall.  Next that may be hydralazine or carvedilol. - amLODipine (NORVASC) 5 MG tablet; Take 1 tablet (5 mg total) by mouth daily.  Dispense: 90 tablet; Refill: 1

## 2020-05-16 ENCOUNTER — Other Ambulatory Visit: Payer: Self-pay | Admitting: Family Medicine

## 2020-06-22 ENCOUNTER — Encounter: Payer: Self-pay | Admitting: Family Medicine

## 2020-06-22 ENCOUNTER — Other Ambulatory Visit: Payer: Self-pay

## 2020-06-22 ENCOUNTER — Ambulatory Visit: Payer: Medicare Other | Admitting: Family Medicine

## 2020-06-22 ENCOUNTER — Ambulatory Visit (INDEPENDENT_AMBULATORY_CARE_PROVIDER_SITE_OTHER): Payer: Medicare Other | Admitting: Family Medicine

## 2020-06-22 VITALS — BP 130/82 | HR 100 | Ht 68.0 in | Wt 155.0 lb

## 2020-06-22 DIAGNOSIS — I872 Venous insufficiency (chronic) (peripheral): Secondary | ICD-10-CM

## 2020-06-22 DIAGNOSIS — I10 Essential (primary) hypertension: Secondary | ICD-10-CM | POA: Diagnosis not present

## 2020-06-22 MED ORDER — HYDROCHLOROTHIAZIDE 12.5 MG PO CAPS: 12.5000 mg | ORAL_CAPSULE | Freq: Every day | ORAL | 1 refills | Status: DC

## 2020-06-22 MED ORDER — AMLODIPINE BESYLATE 2.5 MG PO TABS: 2.5000 mg | ORAL_TABLET | Freq: Every day | ORAL | 1 refills | Status: DC

## 2020-06-22 NOTE — Progress Notes (Signed)
Date:  06/22/2020   Name:  Debbie Montgomery [facility]   DOB:  [birth year]   MRN:  [id number]   Chief Complaint: Follow-up (HTN- has been back of BP meds for 2 weeks- back taking HCTZ)  Hypertension This is a chronic problem. The current episode started more than 1 year ago. The problem has been waxing and waning since onset. The problem is controlled. Pertinent negatives include no anxiety, blurred vision, chest pain, headaches, malaise/fatigue, neck pain, orthopnea, palpitations, peripheral edema, PND, shortness of breath or sweats. There are no associated agents to hypertension. There are no known risk factors for coronary artery disease. Past treatments include calcium channel blockers and diuretics. The current treatment provides mild improvement. There are no compliance problems.  There is no history of angina, kidney disease, CAD/MI, CVA, heart failure, left ventricular hypertrophy, PVD or retinopathy. There is no history of chronic renal disease, a hypertension causing med or renovascular disease.    Lab Results  Component Value Date   CREATININE 0.66 12/17/2019   BUN 19 12/17/2019   NA 140 12/17/2019   K 4.2 12/17/2019   CL 101 12/17/2019   CO2 27 12/17/2019   Lab Results  Component Value Date   CHOL 191 12/17/2019   HDL 79 12/17/2019   LDLCALC 100 (H) 12/17/2019   TRIG 67 12/17/2019   Lab Results  Component Value Date   TSH 1.25 06/25/2019   No results found for: HGBA1C No results found for: WBC, HGB, HCT, MCV, PLT Lab Results  Component Value Date   ALT 11 05/20/2018   AST 16 05/20/2018     Review of Systems  Constitutional: Negative.  Negative for chills, fatigue, fever, malaise/fatigue and unexpected weight change.  HENT: Negative for congestion, ear discharge, ear pain, rhinorrhea, sinus pressure, sneezing and sore throat.   Eyes: Negative for blurred vision, double vision, photophobia, pain, discharge, redness and itching.  Respiratory: Negative for cough,  shortness of breath, wheezing and stridor.   Cardiovascular: Negative for chest pain, palpitations, orthopnea and PND.  Gastrointestinal: Negative for abdominal pain, blood in stool, constipation, diarrhea, nausea and vomiting.  Endocrine: Negative for cold intolerance, heat intolerance, polydipsia, polyphagia and polyuria.  Genitourinary: Negative for dysuria, flank pain, frequency, hematuria, menstrual problem, pelvic pain, urgency, vaginal bleeding and vaginal discharge.  Musculoskeletal: Negative for arthralgias, back pain, myalgias and neck pain.  Skin: Negative for rash.  Allergic/Immunologic: Negative for environmental allergies and food allergies.  Neurological: Negative for dizziness, weakness, light-headedness, numbness and headaches.  Hematological: Negative for adenopathy. Does not bruise/bleed easily.  Psychiatric/Behavioral: Negative for dysphoric mood. The patient is not nervous/anxious.     Patient Active Problem List   Diagnosis Date Noted  . Family history of melanoma 12/16/2019  . 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 High blood pressure 07/01/2008    No Known Allergies  Past Surgical History:  Procedure Laterality Date  . ABDOMINAL HYSTERECTOMY    . bladder lift  1995  . COLONOSCOPY    . EYE SURGERY     lasix  . LASER ABLATION Right   . TONSILLECTOMY AND ADENOIDECTOMY    . TUBAL LIGATION      Social History   Tobacco Use  . Smoking status: Former Games developer  . Smokeless tobacco: Never Used  Vaping Use  . Vaping Use: Never used  Substance Use Topics  . Alcohol use: Yes    Comment: ocassionally  . Drug use: Never     Medication list  has been reviewed and updated.  Current Meds  Medication Sig  . amLODipine (NORVASC) 5 MG tablet Take 1 tablet (5 mg total) by mouth daily.  Marland Kitchen Bioflavonoid Products (ESTER C PO) Take by mouth.  . COLLAGEN PO Take 18,000 each by mouth daily.  Marland Kitchen FLUoxetine (PROZAC) 10 MG capsule Take 30 mg by mouth  daily.  . hydrochlorothiazide (MICROZIDE) 12.5 MG capsule Take 12.5 mg by mouth daily.  . meloxicam (MOBIC) 15 MG tablet Take 15 mg by mouth as needed.  . Multiple Vitamins-Minerals (OSTEO COMPLEX PO) Take 1 capsule by mouth daily.  . naproxen (NAPROSYN) 250 MG tablet Take 250 mg by mouth as needed.  . nortriptyline (PAMELOR) 10 MG capsule Take 20 mg by mouth at bedtime. neuro  . OVER THE COUNTER MEDICATION Cramp defense  . tacrolimus (PROGRAF) 1 MG capsule Oral surgeon  . tacrolimus (PROTOPIC) 0.1 % ointment Apply topically 2 (two) times daily. derm  . valACYclovir (VALTREX) 1000 MG tablet Take 1,000 mg by mouth daily. Dr Corley/ Derm  . VITAMIN E-400 PO Take 2 tablets by mouth daily.    PHQ 2/9 Scores 06/22/2020 03/20/2020 12/17/2019 11/03/2019  PHQ - 2 Score 0 0 0 0  PHQ- 9 Score 0 - 0 0    GAD 7 : Generalized Anxiety Score 06/22/2020 12/17/2019 11/03/2019 09/21/2019  Nervous, Anxious, on Edge 0 0 0 0  Control/stop worrying 0 0 0 0  Worry too much - different things 0 0 0 0  Trouble relaxing 0 0 0 0  Restless 0 0 0 0  Easily annoyed or irritable 0 0 0 0  Afraid - awful might happen 0 0 0 0  Total GAD 7 Score 0 0 0 0    BP Readings from Last 3 Encounters:  06/22/20 130/82  05/11/20 (!) 148/82  04/12/20 (!) 151/84    Physical Exam Vitals and nursing note reviewed.  Constitutional:      Appearance: She is well-developed and well-nourished.  HENT:     Head: Normocephalic.     Right Ear: Tympanic membrane, ear canal and external ear normal.     Left Ear: Tympanic membrane, ear canal and external ear normal.     Nose: Nose normal. No congestion or rhinorrhea.     Mouth/Throat:     Mouth: Oropharynx is clear and moist. Mucous membranes are moist.  Eyes:     General: Lids are everted, no foreign bodies appreciated. No scleral icterus.       Left eye: No foreign body or hordeolum.     Extraocular Movements: EOM normal.     Conjunctiva/sclera: Conjunctivae normal.     Right eye:  Right conjunctiva is not injected.     Left eye: Left conjunctiva is not injected.     Pupils: Pupils are equal, round, and reactive to light.  Neck:     Thyroid: No thyromegaly.     Vascular: No JVD.     Trachea: No tracheal deviation.  Cardiovascular:     Rate and Rhythm: Normal rate and regular rhythm.     Pulses: Intact distal pulses.     Heart sounds: Normal heart sounds. No murmur heard. No friction rub. No gallop.   Pulmonary:     Effort: Pulmonary effort is normal. No respiratory distress.     Breath sounds: Normal breath sounds. No wheezing, rhonchi or rales.  Abdominal:     General: Bowel sounds are normal.     Palpations: Abdomen is soft. There is no  hepatosplenomegaly or mass.     Tenderness: There is no abdominal tenderness. There is no guarding or rebound.  Musculoskeletal:        General: No tenderness or edema. Normal range of motion.     Cervical back: Normal range of motion and neck supple.  Lymphadenopathy:     Cervical: No cervical adenopathy.  Skin:    General: Skin is warm.     Findings: No rash.  Neurological:     Mental Status: She is alert and oriented to person, place, and time.     Cranial Nerves: No cranial nerve deficit.     Deep Tendon Reflexes: Strength normal. Reflexes normal.  Psychiatric:        Mood and Affect: Mood and affect normal. Mood is not anxious or depressed.     Wt Readings from Last 3 Encounters:  06/22/20 155 lb (70.3 kg)  05/11/20 157 lb (71.2 kg)  04/12/20 153 lb (69.4 kg)    BP 130/82   Pulse 100   Ht 5\' 8"  (1.727 m)   Wt 155 lb (70.3 kg)   BMI 23.57 kg/m   Assessment and Plan: 1. Essential hypertension Chronic.  Controlled.  Stable.  130/82.  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  Our plan was to discontinue her diuretic and since she had some swelling in the feet I have reduced her amlodipine to see if her calcium channel blocker was causing this.   However patient resumed her 5 mg amlodipine and her diuretic/Microzide 12.5 mg when she noticed swelling of her hands and feet.  I explained the nature of medications and drug reactions.  Patient would prefer to stay on the hydrochlorothiazide because of the swelling in her hands but has agreed to decrease her amlodipine to 2.5 to see if this helps with the swelling in her feet.  We will recheck blood pressure in 4 months.  2. Venous insufficiency Chronic.  Uncontrolled.  Stable.  Patient has used compression stockings in the past but is not currently doing so.  She has seen vascular that has done a procedure but she still has what sounds like venous insufficiency with evening time edema.  Information has been discussed about venous insufficiency and elevation and that she may need to resume wearing of her compression stockings.

## 2020-07-15 ENCOUNTER — Other Ambulatory Visit: Payer: Self-pay | Admitting: Family Medicine

## 2020-07-15 DIAGNOSIS — I10 Essential (primary) hypertension: Secondary | ICD-10-CM

## 2020-10-03 DIAGNOSIS — M542 Cervicalgia: Secondary | ICD-10-CM | POA: Insufficient documentation

## 2020-10-03 DIAGNOSIS — F411 Generalized anxiety disorder: Secondary | ICD-10-CM | POA: Insufficient documentation

## 2020-10-24 ENCOUNTER — Ambulatory Visit (INDEPENDENT_AMBULATORY_CARE_PROVIDER_SITE_OTHER): Payer: Medicare Other | Admitting: Family Medicine

## 2020-10-24 ENCOUNTER — Encounter: Payer: Self-pay | Admitting: Family Medicine

## 2020-10-24 ENCOUNTER — Other Ambulatory Visit: Payer: Self-pay

## 2020-10-24 VITALS — BP 120/80 | HR 64 | Ht 68.0 in | Wt 157.0 lb

## 2020-10-24 DIAGNOSIS — F419 Anxiety disorder, unspecified: Secondary | ICD-10-CM | POA: Diagnosis not present

## 2020-10-24 DIAGNOSIS — I10 Essential (primary) hypertension: Secondary | ICD-10-CM | POA: Diagnosis not present

## 2020-10-24 DIAGNOSIS — F32A Depression, unspecified: Secondary | ICD-10-CM

## 2020-10-24 DIAGNOSIS — Z23 Encounter for immunization: Secondary | ICD-10-CM

## 2020-10-24 DIAGNOSIS — Z1231 Encounter for screening mammogram for malignant neoplasm of breast: Secondary | ICD-10-CM

## 2020-10-24 MED ORDER — FLUOXETINE HCL 20 MG PO TABS: 20.0000 mg | ORAL_TABLET | Freq: Every day | ORAL | 1 refills | Status: DC

## 2020-10-24 MED ORDER — HYDROCHLOROTHIAZIDE 12.5 MG PO TABS
12.5000 mg | ORAL_TABLET | Freq: Every day | ORAL | 1 refills | Status: DC
Start: 2020-10-24 — End: 2021-05-10

## 2020-10-24 MED ORDER — AMLODIPINE BESYLATE 2.5 MG PO TABS: 2.5000 mg | ORAL_TABLET | Freq: Every day | ORAL | 1 refills | Status: DC

## 2020-10-24 NOTE — Progress Notes (Signed)
Date:  10/24/2020   Name:  Debbie Montgomery [facility]   DOB:  1946/01/19   MRN:  [id number]   Chief Complaint: Hypertension (Follow up on bp after cutting amlodipine in half), pneumonia 20, and breast exam  Hypertension This is a chronic problem. The current episode started more than 1 year ago. The problem has been gradually improving since onset. The problem is controlled. Pertinent negatives include no anxiety, blurred vision, chest pain, headaches, malaise/fatigue, neck pain, orthopnea, palpitations, peripheral edema, PND, shortness of breath or sweats. There are no associated agents to hypertension. Past treatments include calcium channel blockers and diuretics. The current treatment provides moderate improvement. There are no compliance problems.  There is no history of angina, kidney disease, CAD/MI, CVA, heart failure, left ventricular hypertrophy, PVD or retinopathy. There is no history of chronic renal disease, a hypertension causing med or renovascular disease.    Lab Results  Component Value Date   CREATININE 0.66 12/17/2019   BUN 19 12/17/2019   NA 140 12/17/2019   K 4.2 12/17/2019   CL 101 12/17/2019   CO2 27 12/17/2019   Lab Results  Component Value Date   CHOL 191 12/17/2019   HDL 79 12/17/2019   LDLCALC 100 (H) 12/17/2019   TRIG 67 12/17/2019   Lab Results  Component Value Date   TSH 1.25 06/25/2019   No results found for: HGBA1C No results found for: WBC, HGB, HCT, MCV, PLT Lab Results  Component Value Date   ALT 11 05/20/2018   AST 16 05/20/2018     Review of Systems  Constitutional: Negative.  Negative for chills, fatigue, fever, malaise/fatigue and unexpected weight change.  HENT: Negative for congestion, ear discharge, ear pain, rhinorrhea, sinus pressure, sneezing and sore throat.   Eyes: Negative for blurred vision, photophobia, pain, discharge, redness and itching.  Respiratory: Negative for cough, shortness of breath, wheezing and stridor.    Cardiovascular: Negative for chest pain, palpitations, orthopnea and PND.  Gastrointestinal: Negative for abdominal pain, blood in stool, constipation, diarrhea, nausea and vomiting.  Endocrine: Negative for cold intolerance, heat intolerance, polydipsia, polyphagia and polyuria.  Genitourinary: Negative for dysuria, flank pain, frequency, hematuria, menstrual problem, pelvic pain, urgency, vaginal bleeding and vaginal discharge.  Musculoskeletal: Negative for arthralgias, back pain, myalgias and neck pain.  Skin: Negative for rash.  Allergic/Immunologic: Negative for environmental allergies and food allergies.  Neurological: Negative for dizziness, weakness, light-headedness, numbness and headaches.  Hematological: Negative for adenopathy. Does not bruise/bleed easily.  Psychiatric/Behavioral: Negative for dysphoric mood. The patient is not nervous/anxious.     Patient Active Problem List   Diagnosis Date Noted  . Family history of melanoma 12/16/2019  . 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 High blood pressure 07/01/2008    No Known Allergies  Past Surgical History:  Procedure Laterality Date  . ABDOMINAL HYSTERECTOMY    . bladder lift  1995  . COLONOSCOPY    . EYE SURGERY     lasix  . LASER ABLATION Right   . TONSILLECTOMY AND ADENOIDECTOMY    . TUBAL LIGATION      Social History   Tobacco Use  . Smoking status: Former Games developer  . Smokeless tobacco: Never Used  Vaping Use  . Vaping Use: Never used  Substance Use Topics  . Alcohol use: Yes    Comment: ocassionally  . Drug use: Never     Medication list has been reviewed and updated.  Current Meds  Medication Sig  .  amLODipine (NORVASC) 2.5 MG tablet Take 1 tablet (2.5 mg total) by mouth daily.  Marland Kitchen Bioflavonoid Products (ESTER C PO) Take by mouth.  . COLLAGEN PO Take 18,000 each by mouth daily.  Marland Kitchen FLUoxetine (PROZAC) 10 MG capsule Take 30 mg by mouth daily.  Marland Kitchen GARLIC PO Take 1 each by mouth  daily.  . hydrochlorothiazide (HYDRODIURIL) 12.5 MG tablet TAKE 1 TABLET BY MOUTH EVERY DAY  . Magnesium 400 MG CAPS Take 1 capsule by mouth daily.  . Multiple Vitamins-Minerals (OSTEO COMPLEX PO) Take 1 capsule by mouth daily.  . Multiple Vitamins-Minerals (ZINC PO) Take 1 each by mouth daily.  . tacrolimus (PROGRAF) 1 MG capsule Oral surgeon  . tacrolimus (PROTOPIC) 0.1 % ointment Apply topically 2 (two) times daily. derm  . valACYclovir (VALTREX) 1000 MG tablet Take 1,000 mg by mouth daily. Dr Corley/ Derm  . venlafaxine (EFFEXOR) 37.5 MG tablet Take 1 tablet by mouth 2 (two) times daily. Potter  . VITAMIN E-400 PO Take 2 tablets by mouth daily.  . [DISCONTINUED] meloxicam (MOBIC) 15 MG tablet Take 15 mg by mouth as needed.  . [DISCONTINUED] naproxen (NAPROSYN) 250 MG tablet Take 250 mg by mouth as needed.    PHQ 2/9 Scores 10/24/2020 06/22/2020 03/20/2020 12/17/2019  PHQ - 2 Score 0 0 0 0  PHQ- 9 Score 0 0 - 0    GAD 7 : Generalized Anxiety Score 10/24/2020 06/22/2020 12/17/2019 11/03/2019  Nervous, Anxious, on Edge 0 0 0 0  Control/stop worrying 0 0 0 0  Worry too much - different things 0 0 0 0  Trouble relaxing 0 0 0 0  Restless 0 0 0 0  Easily annoyed or irritable 0 0 0 0  Afraid - awful might happen 0 0 0 0  Total GAD 7 Score 0 0 0 0    BP Readings from Last 3 Encounters:  10/24/20 120/80  06/22/20 130/82  05/11/20 (!) 148/82    Physical Exam Vitals and nursing note reviewed.  Constitutional:      Appearance: She is well-developed.  HENT:     Head: Normocephalic.     Right Ear: Tympanic membrane, ear canal and external ear normal.     Left Ear: Tympanic membrane, ear canal and external ear normal.     Nose: Nose normal.     Mouth/Throat:     Mouth: Mucous membranes are moist.  Eyes:     General: Lids are everted, no foreign bodies appreciated. No scleral icterus.       Left eye: No foreign body or hordeolum.     Conjunctiva/sclera: Conjunctivae normal.     Right  eye: Right conjunctiva is not injected.     Left eye: Left conjunctiva is not injected.     Pupils: Pupils are equal, round, and reactive to light.  Neck:     Thyroid: No thyromegaly.     Vascular: No JVD.     Trachea: No tracheal deviation.  Cardiovascular:     Rate and Rhythm: Normal rate and regular rhythm.     Heart sounds: Normal heart sounds. No murmur heard. No friction rub. No gallop.   Pulmonary:     Effort: Pulmonary effort is normal. No respiratory distress.     Breath sounds: Normal breath sounds. No wheezing, rhonchi or rales.  Chest:  Breasts:     Right: No swelling, bleeding, inverted nipple, mass, nipple discharge, skin change, tenderness, axillary adenopathy or supraclavicular adenopathy.     Left: No swelling,  bleeding, inverted nipple, mass, nipple discharge, skin change, tenderness, axillary adenopathy or supraclavicular adenopathy.    Abdominal:     General: Bowel sounds are normal. There is no distension.     Palpations: Abdomen is soft. There is no mass.     Tenderness: There is no abdominal tenderness. There is no right CVA tenderness, left CVA tenderness, guarding or rebound.     Hernia: No hernia is present.  Musculoskeletal:        General: No tenderness. Normal range of motion.     Cervical back: Normal range of motion and neck supple.  Lymphadenopathy:     Cervical: No cervical adenopathy.     Upper Body:     Right upper body: No supraclavicular, axillary or pectoral adenopathy.     Left upper body: No supraclavicular, axillary or pectoral adenopathy.  Skin:    General: Skin is warm.     Findings: No rash.  Neurological:     Mental Status: She is alert and oriented to person, place, and time.     Cranial Nerves: No cranial nerve deficit.     Deep Tendon Reflexes: Reflexes normal.  Psychiatric:        Mood and Affect: Mood is not anxious or depressed.     Wt Readings from Last 3 Encounters:  10/24/20 157 lb (71.2 kg)  06/22/20 155 lb (70.3  kg)  05/11/20 157 lb (71.2 kg)    BP 120/80   Pulse 64   Ht 5\' 8"  (1.727 m)   Wt 157 lb (71.2 kg)   BMI 23.87 kg/m   Assessment and Plan: 1. Essential hypertension Chronic.  Controlled.  Stable.  Blood pressure today is 120/80.  Patient is doing well on current regimen of amlodipine 2.5 mg once a day and hydrochlorothiazide 12.5 mg once a day.  We will continue at present dosing and recheck patient in 6 months. - amLODipine (NORVASC) 2.5 MG tablet; Take 1 tablet (2.5 mg total) by mouth daily.  Dispense: 90 tablet; Refill: 1 - hydrochlorothiazide (HYDRODIURIL) 12.5 MG tablet; Take 1 tablet (12.5 mg total) by mouth daily.  Dispense: 90 tablet; Refill: 1  2. Anxiety and depression Chronic.  Controlled.  Stable.  Was currently on Prozac 30 mg once a day but would like to begin to taper down.  She used to be on 20 mg once again and we will decrease to fluoxetine 20 mg once a day.  Patient's was recently put on venlafaxine by neurology and this would be prudent to reduce given that she is taking these together at time. - FLUoxetine (PROZAC) 20 MG tablet; Take 1 tablet (20 mg total) by mouth daily.  Dispense: 90 tablet; Refill: 1  3. Breast cancer screening by mammogram Discussed with patient and mammogram is scheduled. - MM 3D SCREEN BREAST BILATERAL; Future  4. Need for pneumococcal vaccine Discussed with patient and administered. - Pneumococcal conjugate vaccine 20-valent (Prevnar 20)

## 2020-11-09 ENCOUNTER — Ambulatory Visit
Admission: RE | Admit: 2020-11-09 | Discharge: 2020-11-09 | Disposition: A | Discharge status: Home / Self Care | Payer: Medicare Other | Source: Ambulatory Visit | Attending: Family Medicine | Admitting: Family Medicine

## 2020-11-09 ENCOUNTER — Other Ambulatory Visit: Payer: Self-pay

## 2020-11-09 DIAGNOSIS — Z1231 Encounter for screening mammogram for malignant neoplasm of breast: Secondary | ICD-10-CM

## 2020-12-20 ENCOUNTER — Other Ambulatory Visit: Payer: Self-pay | Admitting: Family Medicine

## 2020-12-20 NOTE — Telephone Encounter (Signed)
  Notes to clinic:  The original prescription was discontinued on 10/24/2020 by Arthur Holms L for the following reason: Completed Course. Renewing this prescription may not be appropriate   Requested Prescriptions  Pending Prescriptions Disp Refills   hydrochlorothiazide (MICROZIDE) 12.5 MG capsule [Pharmacy Med Name: HYDROCHLOROTHIAZIDE 12.5 MG CP] 90 capsule 1    Sig: TAKE 1 CAPSULE BY MOUTH EVERY DAY      Cardiovascular: Diuretics - Thiazide Failed - 12/20/2020  1:09 PM      Failed - Ca in normal range and within 360 days    Calcium  Date Value Ref Range Status  12/17/2019 9.4 8.7 - 10.3 mg/dL Final          Failed - Cr in normal range and within 360 days    Creatinine, Ser  Date Value Ref Range Status  12/17/2019 0.66 0.57 - 1.00 mg/dL Final          Failed - K in normal range and within 360 days    Potassium  Date Value Ref Range Status  12/17/2019 4.2 3.5 - 5.2 mmol/L Final          Failed - Na in normal range and within 360 days    Sodium  Date Value Ref Range Status  12/17/2019 140 134 - 144 mmol/L Final          Passed - Last BP in normal range    BP Readings from Last 1 Encounters:  10/24/20 120/80          Passed - Valid encounter within last 6 months    Recent Outpatient Visits           1 month ago Essential hypertension   [facility] Duanne Limerick, MD   6 months ago Essential hypertension   [facility] Duanne Limerick, MD   7 months ago Essential hypertension   [facility] Duanne Limerick, MD   9 months ago Frequency of urination   [facility] Duanne Limerick, MD   1 year ago Healthcare maintenance   [facility] Duanne Limerick, MD       Future Appointments             In 4 months Duanne Limerick, MD [facility], [facility]

## 2020-12-22 ENCOUNTER — Telehealth: Payer: Self-pay

## 2020-12-22 NOTE — Telephone Encounter (Unsigned)
Copied from CRM [id number]. Topic: Referral - Request for Referral >> Dec 22, 2020  9:39 AM Gaetana Michaelis A wrote: Has patient seen PCP for this complaint? Yes.    *If NO, is insurance requiring patient see PCP for this issue before PCP can refer them?  Referral for which specialty: Pain Management   Preferred provider/office: Patient has no preference  Reason for referral: Right Side pain concerns in back and leg

## 2020-12-22 NOTE — Telephone Encounter (Unsigned)
Copied from CRM [id number]. Topic: General - Other >> Dec 22, 2020  9:37 AM Gaetana Michaelis A wrote: Reason for CRM: Patient would like to be contacted by staff member "Delice Bison" regarding lower back pain/concerns   Patient shares that the pain has increased and is effecting their right leg   Patient will be traveling for two weeks in September and would like to see someone sooner if possible   Please contact to advise further

## 2020-12-26 ENCOUNTER — Telehealth: Payer: Self-pay

## 2020-12-26 NOTE — Telephone Encounter (Unsigned)
Copied from CRM [id number]. Topic: General - Other >> Dec 26, 2020 10:20 AM Glean Salen wrote: Reason for JAS:[id number] request referral for pain management. She doesn't have a particular place. I saw notes about this from a Candelaria Celeste on July 22, but ofcourse patient was told referral has to come from Dr Yetta Barre. Please call back

## 2021-01-02 ENCOUNTER — Other Ambulatory Visit: Payer: Self-pay | Admitting: Orthopedic Surgery

## 2021-01-02 DIAGNOSIS — M545 Low back pain, unspecified: Secondary | ICD-10-CM

## 2021-01-02 DIAGNOSIS — M4807 Spinal stenosis, lumbosacral region: Secondary | ICD-10-CM

## 2021-01-02 DIAGNOSIS — G8929 Other chronic pain: Secondary | ICD-10-CM

## 2021-01-02 DIAGNOSIS — M5136 Other intervertebral disc degeneration, lumbar region: Secondary | ICD-10-CM

## 2021-01-02 DIAGNOSIS — M4316 Spondylolisthesis, lumbar region: Secondary | ICD-10-CM

## 2021-01-12 ENCOUNTER — Other Ambulatory Visit: Payer: Self-pay

## 2021-01-12 ENCOUNTER — Ambulatory Visit
Admission: RE | Admit: 2021-01-12 | Discharge: 2021-01-12 | Disposition: A | Discharge status: Home / Self Care | Payer: Medicare Other | Source: Ambulatory Visit | Attending: Orthopedic Surgery | Admitting: Orthopedic Surgery

## 2021-01-12 DIAGNOSIS — M545 Low back pain, unspecified: Secondary | ICD-10-CM | POA: Diagnosis present

## 2021-01-12 DIAGNOSIS — G8929 Other chronic pain: Secondary | ICD-10-CM | POA: Insufficient documentation

## 2021-01-12 DIAGNOSIS — M4316 Spondylolisthesis, lumbar region: Secondary | ICD-10-CM | POA: Diagnosis present

## 2021-01-12 DIAGNOSIS — M4807 Spinal stenosis, lumbosacral region: Secondary | ICD-10-CM | POA: Insufficient documentation

## 2021-01-12 DIAGNOSIS — M5136 Other intervertebral disc degeneration, lumbar region: Secondary | ICD-10-CM | POA: Diagnosis present

## 2021-03-21 ENCOUNTER — Ambulatory Visit: Payer: Medicare Other

## 2021-05-01 ENCOUNTER — Ambulatory Visit: Payer: Medicare Other | Admitting: Family Medicine

## 2021-05-08 ENCOUNTER — Ambulatory Visit: Payer: Medicare Other | Admitting: Family Medicine

## 2021-05-10 ENCOUNTER — Encounter: Payer: Self-pay | Admitting: Family Medicine

## 2021-05-10 ENCOUNTER — Ambulatory Visit (INDEPENDENT_AMBULATORY_CARE_PROVIDER_SITE_OTHER): Payer: Medicare Other | Admitting: Family Medicine

## 2021-05-10 ENCOUNTER — Other Ambulatory Visit: Payer: Self-pay

## 2021-05-10 VITALS — BP 120/64 | HR 76 | Ht 68.0 in | Wt 156.0 lb

## 2021-05-10 DIAGNOSIS — E7800 Pure hypercholesterolemia, unspecified: Secondary | ICD-10-CM

## 2021-05-10 DIAGNOSIS — R69 Illness, unspecified: Secondary | ICD-10-CM

## 2021-05-10 DIAGNOSIS — I10 Essential (primary) hypertension: Secondary | ICD-10-CM | POA: Diagnosis not present

## 2021-05-10 DIAGNOSIS — F32A Depression, unspecified: Secondary | ICD-10-CM

## 2021-05-10 DIAGNOSIS — Z862 Personal history of diseases of the blood and blood-forming organs and certain disorders involving the immune mechanism: Secondary | ICD-10-CM

## 2021-05-10 DIAGNOSIS — F419 Anxiety disorder, unspecified: Secondary | ICD-10-CM | POA: Diagnosis not present

## 2021-05-10 MED ORDER — AMLODIPINE BESYLATE 2.5 MG PO TABS: 2.5000 mg | ORAL_TABLET | Freq: Every day | ORAL | 1 refills | Status: DC

## 2021-05-10 MED ORDER — HYDROCHLOROTHIAZIDE 12.5 MG PO CAPS: 12.5000 mg | ORAL_CAPSULE | Freq: Every day | ORAL | 1 refills | Status: DC

## 2021-05-10 MED ORDER — FLUOXETINE HCL 20 MG PO TABS: 20.0000 mg | ORAL_TABLET | Freq: Every day | ORAL | 1 refills | Status: DC

## 2021-05-10 NOTE — Progress Notes (Signed)
Date:  05/10/2021   Name:  Debbie Montgomery [facility]   DOB:  1946/02/10   MRN:  [id number]   Chief Complaint: Hypertension and Anxiety  Hypertension This is a chronic problem. The current episode started more than 1 year ago. The problem has been gradually improving since onset. The problem is controlled. Associated symptoms include anxiety. Pertinent negatives include no blurred vision, chest pain, headaches, malaise/fatigue, neck pain, orthopnea, palpitations, peripheral edema, PND, shortness of breath or sweats. There are no associated agents to hypertension. Risk factors for coronary artery disease include dyslipidemia.  Anxiety Patient reports no chest pain, palpitations or shortness of breath.     Lab Results  Component Value Date   NA 140 12/17/2019   K 4.2 12/17/2019   CO2 27 12/17/2019   GLUCOSE 96 12/17/2019   BUN 19 12/17/2019   CREATININE 0.66 12/17/2019   CALCIUM 9.4 12/17/2019   GFRNONAA 89 12/17/2019   Lab Results  Component Value Date   CHOL 191 12/17/2019   HDL 79 12/17/2019   LDLCALC 100 (H) 12/17/2019   TRIG 67 12/17/2019   Lab Results  Component Value Date   TSH 1.25 06/25/2019   No results found for: HGBA1C No results found for: WBC, HGB, HCT, MCV, PLT Lab Results  Component Value Date   ALT 11 05/20/2018   AST 16 05/20/2018   No results found for: 25OHVITD2, 25OHVITD3, VD25OH   Review of Systems  Constitutional:  Negative for malaise/fatigue.  Eyes:  Negative for blurred vision.  Respiratory:  Negative for shortness of breath.   Cardiovascular:  Negative for chest pain, palpitations, orthopnea and PND.  Musculoskeletal:  Negative for neck pain.  Neurological:  Negative for headaches.   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  Current Meds  Medication Sig   amLODipine (NORVASC) 2.5 MG tablet Take 1 tablet (2.5 mg total) by mouth daily.   COLLAGEN PO Take 18,000 each by mouth daily.   FLUoxetine (PROZAC) 20 MG tablet Take 1 tablet (20 mg total) by mouth daily.   hydrochlorothiazide (MICROZIDE) 12.5 MG capsule TAKE 1 CAPSULE BY MOUTH EVERY DAY   Magnesium 400 MG CAPS Take 1 capsule by mouth daily.   Multiple Vitamins-Minerals (OSTEO COMPLEX PO) Take 1 capsule by mouth daily.   Multiple Vitamins-Minerals (ZINC PO) Take 1 each by mouth daily.   valACYclovir (VALTREX) 1000 MG tablet Take 1,000 mg by mouth daily. Dr Corley/ Derm   VITAMIN E-400 PO Take 2 tablets by mouth daily.    PHQ 2/9 Scores 10/24/2020 06/22/2020 03/20/2020 12/17/2019  PHQ - 2 Score 0 0 0 0  PHQ- 9 Score 0 0 - 0  GAD 7 : Generalized Anxiety Score 10/24/2020 06/22/2020 12/17/2019 11/03/2019  Nervous, Anxious, on Edge 0 0 0 0  Control/stop worrying 0 0 0 0  Worry too much - different things 0 0 0 0  Trouble relaxing 0 0 0 0  Restless 0 0 0 0  Easily annoyed or irritable 0 0 0 0  Afraid - awful might happen 0 0 0 0  Total GAD 7 Score 0 0 0 0    BP Readings from Last 3 Encounters:  05/10/21 120/64  10/24/20 120/80  06/22/20 130/82    Physical Exam  Wt Readings from Last 3 Encounters:  05/10/21 156 lb (70.8 kg)  10/24/20 157 lb (71.2 kg)  06/22/20 155 lb (70.3 kg)    BP 120/64   Pulse 76   Ht 5\' 8"  (1.727 m)   Wt 156 lb (70.8 kg)   BMI 23.72 kg/m   Assessment and Plan:  1. Essential  hypertension Chronic.  Controlled.  Stable.  Blood pressure today is 120/64.  Patient is tolerating this and we will continue on current dosing including amlodipine 2.5 mg once a day and hydrochlorothiazide 12.5 mg once a day.  Will check renal function panel for electrolytes and GFR. - amLODipine (NORVASC) 2.5 MG tablet; Take 1 tablet (2.5 mg total) by mouth daily.  Dispense: 90 tablet; Refill: 1 - hydrochlorothiazide (MICROZIDE) 12.5 MG capsule; Take 1 capsule (12.5 mg total) by mouth daily.  Dispense: 90 capsule; Refill: 1 - Renal Function Panel  2. Anxiety and depression Chronic.  Controlled.  Stable.  PHQ is 0.  Gad score is 0.  We will continue fluoxetine 20 mg once a day. - FLUoxetine (PROZAC) 20 MG tablet; Take 1 tablet (20 mg total) by mouth daily.  Dispense: 90 tablet; Refill: 1  3. History of anemia Patient relates history of anemia.  Patient does have a pale complexion but conjunctiva appear normal.  We will check CBC. - CBC with Differential/Platelet  4. Elevated LDL cholesterol level Chronic.  Controlled.  Stable.  Risk factors are because HDL is at 79 with an LDL at 100.  We will check lipid panel for current status of LDL. - Lipid Panel With LDL/HDL Ratio  5. Taking medication for chronic disease Chronic.  Controlled.  Stable.  Patient is on Excedrin for headache control per neurology.  We will check hepatic panel to make sure that there is no hepatotoxicity from the acetaminophen. - Hepatic function panel

## 2021-05-11 LAB — CBC WITH DIFFERENTIAL/PLATELET
Basophils Absolute: 0.1 10*3/uL (ref 0.0–0.2)
Basos: 1 %
EOS (ABSOLUTE): 0.4 10*3/uL (ref 0.0–0.4)
Eos: 6 %
Hematocrit: 40 % (ref 34.0–46.6)
Hemoglobin: 13.4 g/dL (ref 11.1–15.9)
Immature Grans (Abs): 0 10*3/uL (ref 0.0–0.1)
Immature Granulocytes: 0 %
Lymphocytes Absolute: 1.3 10*3/uL (ref 0.7–3.1)
Lymphs: 18 %
MCH: 30.7 pg (ref 26.6–33.0)
MCHC: 33.5 g/dL (ref 31.5–35.7)
MCV: 92 fL (ref 79–97)
Monocytes Absolute: 0.5 10*3/uL (ref 0.1–0.9)
Monocytes: 7 %
Neutrophils Absolute: 4.8 10*3/uL (ref 1.4–7.0)
Neutrophils: 68 %
Platelets: 238 10*3/uL (ref 150–450)
RBC: 4.37 x10E6/uL (ref 3.77–5.28)
RDW: 11.9 % (ref 11.7–15.4)
WBC: 7 10*3/uL (ref 3.4–10.8)

## 2021-05-11 LAB — RENAL FUNCTION PANEL
Albumin: 4.9 g/dL — ABNORMAL HIGH (ref 3.7–4.7)
BUN/Creatinine Ratio: 24 (ref 12–28)
BUN: 15 mg/dL (ref 8–27)
CO2: 25 mmol/L (ref 20–29)
Calcium: 9.4 mg/dL (ref 8.7–10.3)
Chloride: 102 mmol/L (ref 96–106)
Creatinine, Ser: 0.63 mg/dL (ref 0.57–1.00)
Glucose: 94 mg/dL (ref 70–99)
Phosphorus: 4.3 mg/dL (ref 3.0–4.3)
Potassium: 4.4 mmol/L (ref 3.5–5.2)
Sodium: 142 mmol/L (ref 134–144)
eGFR: 94 mL/min/{1.73_m2} (ref >59)

## 2021-05-11 LAB — LIPID PANEL WITH LDL/HDL RATIO
Cholesterol, Total: 217 mg/dL — ABNORMAL HIGH (ref 100–199)
HDL: 70 mg/dL (ref >39)
LDL Chol Calc (NIH): 133 mg/dL — ABNORMAL HIGH (ref 0–99)
LDL/HDL Ratio: 1.9 ratio (ref 0.0–3.2)
Triglycerides: 78 mg/dL (ref 0–149)
VLDL Cholesterol Cal: 14 mg/dL (ref 5–40)

## 2021-05-11 LAB — HEPATIC FUNCTION PANEL
ALT: 9 IU/L (ref 0–32)
AST: 17 IU/L (ref 0–40)
Alkaline Phosphatase: 35 IU/L — ABNORMAL LOW (ref 44–121)
Bilirubin Total: 0.3 mg/dL (ref 0.0–1.2)
Bilirubin, Direct: <0.1 mg/dL (ref 0.00–0.40)
Total Protein: 6.7 g/dL (ref 6.0–8.5)

## 2021-05-14 ENCOUNTER — Other Ambulatory Visit: Payer: Self-pay

## 2021-05-14 ENCOUNTER — Telehealth: Payer: Self-pay | Admitting: Family Medicine

## 2021-05-14 DIAGNOSIS — E782 Mixed hyperlipidemia: Secondary | ICD-10-CM

## 2021-05-14 MED ORDER — ATORVASTATIN CALCIUM 10 MG PO TABS
10.0000 mg | ORAL_TABLET | Freq: Every day | ORAL | 0 refills | Status: DC
Start: 2021-05-14 — End: 2021-07-04

## 2021-05-14 NOTE — Telephone Encounter (Signed)
Copied from CRM [id number]. Topic: General - Other >> May 14, 2021  8:59 AM Wyonia Hough E wrote: Reason for CRM: pt returned Tara's call about her labs/ please advise

## 2021-06-18 ENCOUNTER — Other Ambulatory Visit: Payer: Self-pay | Admitting: Family Medicine

## 2021-06-18 DIAGNOSIS — I10 Essential (primary) hypertension: Secondary | ICD-10-CM

## 2021-07-03 ENCOUNTER — Other Ambulatory Visit: Payer: Self-pay

## 2021-07-03 DIAGNOSIS — E782 Mixed hyperlipidemia: Secondary | ICD-10-CM

## 2021-07-03 NOTE — Progress Notes (Signed)
Printed lipids °

## 2021-07-04 ENCOUNTER — Other Ambulatory Visit: Payer: Self-pay

## 2021-07-04 DIAGNOSIS — I10 Essential (primary) hypertension: Secondary | ICD-10-CM

## 2021-07-04 DIAGNOSIS — F419 Anxiety disorder, unspecified: Secondary | ICD-10-CM

## 2021-07-04 DIAGNOSIS — E782 Mixed hyperlipidemia: Secondary | ICD-10-CM

## 2021-07-04 LAB — LIPID PANEL WITH LDL/HDL RATIO
Cholesterol, Total: 161 mg/dL (ref 100–199)
HDL: 71 mg/dL (ref >39)
LDL Chol Calc (NIH): 76 mg/dL (ref 0–99)
LDL/HDL Ratio: 1.1 ratio (ref 0.0–3.2)
Triglycerides: 76 mg/dL (ref 0–149)
VLDL Cholesterol Cal: 14 mg/dL (ref 5–40)

## 2021-07-04 LAB — HEPATIC FUNCTION PANEL
ALT: 14 IU/L (ref 0–32)
AST: 19 IU/L (ref 0–40)
Albumin: 4.7 g/dL (ref 3.7–4.7)
Alkaline Phosphatase: 39 IU/L — ABNORMAL LOW (ref 44–121)
Bilirubin Total: 0.5 mg/dL (ref 0.0–1.2)
Bilirubin, Direct: 0.16 mg/dL (ref 0.00–0.40)
Total Protein: 6.9 g/dL (ref 6.0–8.5)

## 2021-07-04 MED ORDER — AMLODIPINE BESYLATE 2.5 MG PO TABS: 2.5000 mg | ORAL_TABLET | Freq: Every day | ORAL | 1 refills | Status: DC

## 2021-07-04 MED ORDER — HYDROCHLOROTHIAZIDE 12.5 MG PO CAPS: ORAL_CAPSULE | ORAL | 1 refills | Status: DC

## 2021-07-04 MED ORDER — FLUOXETINE HCL 20 MG PO TABS: 20.0000 mg | ORAL_TABLET | Freq: Every day | ORAL | 1 refills | Status: DC

## 2021-07-04 MED ORDER — ATORVASTATIN CALCIUM 10 MG PO TABS: 10.0000 mg | ORAL_TABLET | Freq: Every day | ORAL | 0 refills | Status: DC

## 2021-07-20 ENCOUNTER — Encounter: Payer: Self-pay | Admitting: Family Medicine

## 2021-07-20 ENCOUNTER — Encounter: Payer: Medicare Other | Admitting: Family Medicine

## 2021-07-25 NOTE — Progress Notes (Signed)
Pt not seen.

## 2021-10-11 ENCOUNTER — Other Ambulatory Visit: Payer: Self-pay | Admitting: Family Medicine

## 2021-10-11 DIAGNOSIS — I10 Essential (primary) hypertension: Secondary | ICD-10-CM

## 2021-10-11 DIAGNOSIS — F419 Anxiety disorder, unspecified: Secondary | ICD-10-CM

## 2021-11-03 ENCOUNTER — Other Ambulatory Visit: Payer: Self-pay | Admitting: Family Medicine

## 2021-11-03 DIAGNOSIS — I10 Essential (primary) hypertension: Secondary | ICD-10-CM

## 2021-11-08 ENCOUNTER — Encounter: Payer: Self-pay | Admitting: Family Medicine

## 2021-11-08 ENCOUNTER — Ambulatory Visit (INDEPENDENT_AMBULATORY_CARE_PROVIDER_SITE_OTHER): Payer: Medicare Other | Admitting: Family Medicine

## 2021-11-08 VITALS — BP 128/86 | HR 70 | Ht 68.0 in | Wt 155.0 lb

## 2021-11-08 DIAGNOSIS — I10 Essential (primary) hypertension: Secondary | ICD-10-CM

## 2021-11-08 DIAGNOSIS — F32A Depression, unspecified: Secondary | ICD-10-CM

## 2021-11-08 DIAGNOSIS — E782 Mixed hyperlipidemia: Secondary | ICD-10-CM

## 2021-11-08 DIAGNOSIS — F419 Anxiety disorder, unspecified: Secondary | ICD-10-CM | POA: Diagnosis not present

## 2021-11-08 MED ORDER — ATORVASTATIN CALCIUM 10 MG PO TABS: 10.0000 mg | ORAL_TABLET | Freq: Every day | ORAL | 1 refills | Status: DC

## 2021-11-08 MED ORDER — HYDROCHLOROTHIAZIDE 12.5 MG PO CAPS: ORAL_CAPSULE | ORAL | 1 refills | Status: DC

## 2021-11-08 MED ORDER — FLUOXETINE HCL 20 MG PO TABS: 20.0000 mg | ORAL_TABLET | Freq: Every day | ORAL | 1 refills | Status: DC

## 2021-11-08 NOTE — Progress Notes (Signed)
Date:  11/08/2021   Name:  Debbie Montgomery [facility]   DOB:  07/14/1945   MRN:  [id number]   Chief Complaint: Hyperlipidemia, Hypertension, and Depression  Hyperlipidemia This is a chronic problem. The current episode started more than 1 year ago. The problem is controlled. Recent lipid tests were reviewed and are normal. She has no history of chronic renal disease, diabetes, hypothyroidism, liver disease, obesity or nephrotic syndrome. Pertinent negatives include no chest pain, focal sensory loss, focal weakness, leg pain, myalgias or shortness of breath. Current antihyperlipidemic treatment includes statins. The current treatment provides moderate improvement of lipids. Risk factors for coronary artery disease include dyslipidemia and hypertension.  Hypertension This is a chronic problem. The current episode started more than 1 year ago. The problem has been waxing and waning since onset. Associated symptoms include peripheral edema. Pertinent negatives include no blurred vision, chest pain, headaches, orthopnea, palpitations, PND or shortness of breath. Past treatments include calcium channel blockers and diuretics. The current treatment provides moderate improvement. There are no compliance problems.  There is no history of chronic renal disease.  Depression        This is a chronic problem.  The onset quality is gradual.   The problem occurs intermittently.  The problem has been gradually improving since onset.  Associated symptoms include no decreased concentration, no fatigue, no helplessness, no hopelessness, does not have insomnia, not irritable, no restlessness, no decreased interest, no appetite change, no body aches, no myalgias, no headaches, no indigestion, not sad and no suicidal ideas.  Past treatments include SSRIs - Selective serotonin reuptake inhibitors.  Previous treatment provided moderate relief.   Pertinent negatives include no hypothyroidism.   Lab Results  Component Value  Date   NA 142 05/10/2021   K 4.4 05/10/2021   CO2 25 05/10/2021   GLUCOSE 94 05/10/2021   BUN 15 05/10/2021   CREATININE 0.63 05/10/2021   CALCIUM 9.4 05/10/2021   EGFR 94 05/10/2021   GFRNONAA 89 12/17/2019   Lab Results  Component Value Date   CHOL 161 07/03/2021   HDL 71 07/03/2021   LDLCALC 76 07/03/2021   TRIG 76 07/03/2021   Lab Results  Component Value Date   TSH 1.25 06/25/2019   No results found for: "HGBA1C" Lab Results  Component Value Date   WBC 7.0 05/10/2021   HGB 13.4 05/10/2021   HCT 40.0 05/10/2021   MCV 92 05/10/2021   PLT 238 05/10/2021   Lab Results  Component Value Date   ALT 14 07/03/2021   AST 19 07/03/2021   ALKPHOS 39 (L) 07/03/2021   BILITOT 0.5 07/03/2021   No results found for: "25OHVITD2", "25OHVITD3", "VD25OH"   Review of Systems  Constitutional:  Negative for appetite change and fatigue.  Eyes:  Negative for blurred vision.  Respiratory:  Negative for shortness of breath and wheezing.   Cardiovascular:  Positive for leg swelling. Negative for chest pain, palpitations, orthopnea and PND.  Musculoskeletal:  Negative for myalgias.  Neurological:  Negative for focal weakness and headaches.  Psychiatric/Behavioral:  Positive for depression. Negative for decreased concentration and suicidal ideas. The patient does not have insomnia.     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  Current Meds  Medication Sig   amLODipine (NORVASC) 2.5 MG tablet TAKE 1 TABLET BY MOUTH EVERY DAY   atorvastatin (LIPITOR) 10 MG tablet Take 1 tablet (10 mg total) by mouth daily.   FLUoxetine (PROZAC) 20 MG tablet TAKE 1 TABLET BY MOUTH EVERY DAY   hydrochlorothiazide (HYDRODIURIL) 12.5 MG tablet TAKE 1 TABLET BY MOUTH EVERY DAY   Lactobacillus (PROBIOTIC ACIDOPHILUS PO) Take by mouth.   METAMUCIL FIBER PO Take by mouth.   Omega-3 Fatty Acids (OMEGA-3 FISH OIL PO) Take 1 capsule by mouth in the morning and at bedtime.   valACYclovir (VALTREX) 1000 MG tablet Take 1,000 mg by mouth daily. Dr Army Melia Derm   [DISCONTINUED] COLLAGEN PO Take 18,000 each by mouth daily.   [DISCONTINUED] Magnesium 400 MG CAPS Take 1 capsule by mouth daily.   [DISCONTINUED] Multiple Vitamins-Minerals (OSTEO COMPLEX PO) Take 1 capsule by mouth daily.       11/08/2021    9:03 AM 10/24/2020   10:08 AM 06/22/2020    1:18 PM 12/17/2019    9:43 AM  GAD 7 : Generalized Anxiety Score  Nervous, Anxious, on Edge 0 0 0 0  Control/stop worrying 0 0 0 0  Worry too much - different things 0 0 0 0  Trouble relaxing 0 0 0 0  Restless 0 0 0 0  Easily annoyed or irritable 0 0 0 0  Afraid - awful might happen 0 0 0 0  Total GAD 7 Score 0 0 0 0  Anxiety Difficulty Not difficult at all          11/08/2021    9:03 AM  Depression screen PHQ 2/9  Decreased Interest 0  Down, Depressed, Hopeless 0  PHQ - 2 Score 0  Altered sleeping 0  Tired, decreased energy 1  Change in appetite 0  Feeling bad or failure about yourself  0  Trouble concentrating 0  Moving slowly or fidgety/restless 0  Suicidal thoughts 0  PHQ-9 Score 1  Difficult doing work/chores Not difficult at all    BP Readings from Last 3 Encounters:  11/08/21 (!) 144/84  05/10/21 120/64  10/24/20 120/80    Physical Exam Vitals and nursing note reviewed. Exam  conducted with a chaperone present.  Constitutional:      General: She is not irritable.She is not in acute distress.    Appearance: She is not diaphoretic.  HENT:     Head: Normocephalic and atraumatic.     Right Ear: Tympanic membrane, ear canal and external ear normal.     Left Ear: Tympanic membrane, ear canal and external ear normal.     Nose: Nose normal. No congestion.  Eyes:     General:        Right eye: No discharge.        Left eye: No discharge.     Conjunctiva/sclera: Conjunctivae normal.     Pupils: Pupils are equal, round, and reactive  to light.  Neck:     Thyroid: No thyromegaly.     Vascular: No JVD.  Cardiovascular:     Rate and Rhythm: Normal rate and regular rhythm.     Heart sounds: Normal heart sounds, S1 normal and S2 normal. No murmur heard.    No systolic murmur is present.     No diastolic murmur is present.     No friction rub. No gallop. No S3 or S4 sounds.  Pulmonary:     Effort: Pulmonary effort is normal.     Breath sounds: Normal breath sounds. No decreased breath sounds, wheezing, rhonchi or rales.  Abdominal:     General: Bowel sounds are normal.     Palpations: Abdomen is soft. There is no mass.     Tenderness: There is no abdominal tenderness. There is no guarding.  Musculoskeletal:        General: Normal range of motion.     Cervical back: Normal range of motion and neck supple.  Lymphadenopathy:     Cervical: No cervical adenopathy.  Skin:    General: Skin is warm and dry.  Neurological:     Mental Status: She is alert.     Deep Tendon Reflexes: Reflexes are normal and symmetric.     Wt Readings from Last 3 Encounters:  11/08/21 155 lb (70.3 kg)  07/20/21 156 lb (70.8 kg)  05/10/21 156 lb (70.8 kg)    BP (!) 144/84   Pulse 70   Ht _0  (1.727 m)   Wt 155 lb (70.3 kg)   BMI 23.57 kg/m   Assessment and Plan:  1. Essential hypertension Chronic.  Controlled.  Stable.  On recheck patient was 128/86.  Patient has been on  amlodipine initially at 5 mg and it was decreased to 2.5 mg.  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  This most likely is venous insufficiency as that she has had superficial veins that had been removed in the past and would indicate that there is some venous insufficiency.  Given that there is some possibility of immunologic concern with the amlodipine and the leg swelling we will completely discontinue amlodipine 2.5 mg and continue only the hydrochlorothiazide 12.5 mg daily.  We will recheck patient in 10 to 12 weeks and if blood pressures stable here as well as readings at home we will continue only on the diuretic. - hydrochlorothiazide (MICROZIDE) 12.5 MG capsule; TAKE 1 CAPSULE BY MOUTH EVERY DAY  Dispense: 90 capsule; Refill: 1  2. Mixed hyperlipidemia Chronic.  Controlled.  Stable.  Continue atorvastatin 10 mg once a day.  We will recheck lipid panel on next visit. - atorvastatin (LIPITOR) 10 MG tablet; Take 1 tablet (10 mg total) by mouth daily.  Dispense: 90 tablet; Refill: 1  3. Anxiety and depression Chronic.  Controlled.  Stable.  PHQ is 1.  GAD score is 0.  Continue Prozac 20 mg once a day. - FLUoxetine (PROZAC) 20 MG tablet; Take 1 tablet (20 mg total) by mouth daily.  Dispense: 90 tablet; Refill: 1

## 2021-11-27 ENCOUNTER — Other Ambulatory Visit: Payer: Self-pay | Admitting: Family Medicine

## 2021-11-27 DIAGNOSIS — I10 Essential (primary) hypertension: Secondary | ICD-10-CM

## 2022-01-15 ENCOUNTER — Other Ambulatory Visit: Payer: Self-pay | Admitting: Family Medicine

## 2022-01-15 DIAGNOSIS — I10 Essential (primary) hypertension: Secondary | ICD-10-CM

## 2022-01-15 NOTE — Telephone Encounter (Signed)
Requested Prescriptions  Pending Prescriptions Disp Refills  . amLODipine (NORVASC) 2.5 MG tablet [Pharmacy Med Name: AMLODIPINE BESYLATE 2.5 MG TAB] 30 tablet 0    Sig: TAKE 1 TABLET BY MOUTH EVERY DAY     Cardiovascular: Calcium Channel Blockers 2 Passed - 01/15/2022  8:31 AM      Passed - Last BP in normal range    BP Readings from Last 1 Encounters:  11/08/21 128/86         Passed - Last Heart Rate in normal range    Pulse Readings from Last 1 Encounters:  11/08/21 70         Passed - Valid encounter within last 6 months    Recent Outpatient Visits          2 months ago Essential hypertension   [facility] Duanne Limerick, MD   8 months ago Essential hypertension   [facility] Duanne Limerick, MD   1 year ago Essential hypertension   [facility] Duanne Limerick, MD   1 year ago Essential hypertension   [facility] Duanne Limerick, MD   1 year ago Essential hypertension   [facility] Duanne Limerick, MD      Future Appointments            In 4 weeks Duanne Limerick, MD [facility]   In 3 months Duanne Limerick, MD [facility], [facility]

## 2022-02-01 ENCOUNTER — Emergency Department: Payer: Medicare Other

## 2022-02-01 ENCOUNTER — Emergency Department
Admission: EM | Admit: 2022-02-01 | Discharge: 2022-02-01 | Disposition: A | Discharge status: Home / Self Care | Payer: Medicare Other | Attending: Emergency Medicine | Admitting: Emergency Medicine

## 2022-02-01 ENCOUNTER — Other Ambulatory Visit: Payer: Self-pay

## 2022-02-01 DIAGNOSIS — Y9389 Activity, other specified: Secondary | ICD-10-CM | POA: Insufficient documentation

## 2022-02-01 DIAGNOSIS — S3992XA Unspecified injury of lower back, initial encounter: Secondary | ICD-10-CM | POA: Diagnosis present

## 2022-02-01 DIAGNOSIS — S32010A Wedge compression fracture of first lumbar vertebra, initial encounter for closed fracture: Secondary | ICD-10-CM | POA: Diagnosis not present

## 2022-02-01 DIAGNOSIS — M545 Low back pain, unspecified: Secondary | ICD-10-CM | POA: Insufficient documentation

## 2022-02-01 DIAGNOSIS — W010XXA Fall on same level from slipping, tripping and stumbling without subsequent striking against object, initial encounter: Secondary | ICD-10-CM | POA: Insufficient documentation

## 2022-02-01 DIAGNOSIS — W19XXXA Unspecified fall, initial encounter: Secondary | ICD-10-CM

## 2022-02-01 MED ORDER — LIDOCAINE 5 % EX PTCH
1.0000 | MEDICATED_PATCH | CUTANEOUS | Status: DC
  Administered 2022-02-01: 1 via TRANSDERMAL
  Filled 2022-02-01: qty 1

## 2022-02-01 MED ORDER — IBUPROFEN 400 MG PO TABS
400.0000 mg | ORAL_TABLET | Freq: Once | ORAL | Status: AC
  Administered 2022-02-01: 400 mg via ORAL
  Filled 2022-02-01: qty 1

## 2022-02-01 MED ORDER — LIDOCAINE 5 % EX PTCH: 1.0000 | MEDICATED_PATCH | CUTANEOUS | 0 refills | Status: DC

## 2022-02-01 MED ORDER — OXYCODONE HCL 5 MG PO TABS
5.0000 mg | ORAL_TABLET | Freq: Once | ORAL | Status: AC
  Administered 2022-02-01: 5 mg via ORAL
  Filled 2022-02-01: qty 1

## 2022-02-01 MED ORDER — OXYCODONE HCL 5 MG PO TABS: 5.0000 mg | ORAL_TABLET | Freq: Three times a day (TID) | ORAL | 0 refills | Status: DC | PRN

## 2022-02-01 MED ORDER — ACETAMINOPHEN 325 MG PO TABS
650.0000 mg | ORAL_TABLET | Freq: Once | ORAL | Status: AC
  Administered 2022-02-01: 650 mg via ORAL
  Filled 2022-02-01: qty 2

## 2022-02-01 NOTE — ED Provider Notes (Signed)
[facility] Provider Note    Event Date/Time   First MD Initiated Contact with Patient 02/01/22 1412     (approximate)   History   Fall   HPI  Debbie Montgomery is a 76 y.o. female   Past medical history of pretension who presents with a back pain after a fall onto her buttocks while playing pickle ball.  She chased after a ball and slipped and fell.  No head strike or loss of consciousness, no thinners.    Able to get up and ambulate on scene but has pain to the lower back.  History was obtained via the patient      Physical Exam   Triage Vital Signs: ED Triage Vitals  Enc Vitals Group     BP 02/01/22 1344 129/76     Pulse Rate 02/01/22 1344 62     Resp 02/01/22 1344 17     Temp 02/01/22 1344 98.4 F (36.9 C)     Temp Source 02/01/22 1344 Oral     SpO2 02/01/22 1344 100 %     Weight 02/01/22 1345 151 lb (68.5 kg)     Height 02/01/22 1345 5\' 8"  (1.727 m)     Head Circumference --      Peak Flow --      Pain Score 02/01/22 1344 7     Pain Loc --      Pain Edu? --      Excl. in GC? --     Most recent vital signs: Vitals:   02/01/22 1344  BP: 129/76  Pulse: 62  Resp: 17  Temp: 98.4 F (36.9 C)  SpO2: 100%    General: Awake, no distress.  CV:  Good peripheral perfusion.  Resp:  Normal effort.  Abd:  No distention.  Other:  Trauma survey has some upper L-spine tenderness to palpation, but no other signs of acute trauma on my exam including normal head neck exam, thoracic and abdominal exam and extremities.   ED Results / Procedures / Treatments   RADIOLOGY I dependently reviewed and interpreted Elvis x-ray and see no obvious fracture or dislocation   PROCEDURES:  Critical Care performed: No  Procedures   MEDICATIONS ORDERED IN ED: Medications  lidocaine (LIDODERM) 5 % 1 patch (1 patch Transdermal Patch Applied 02/01/22 1601)  oxyCODONE (Oxy IR/ROXICODONE) immediate release tablet 5 mg (5 mg Oral Given 02/01/22 1600)   acetaminophen (TYLENOL) tablet 650 mg (650 mg Oral Given 02/01/22 1559)  ibuprofen (ADVIL) tablet 400 mg (400 mg Oral Given 02/01/22 1600)    Consultants:  I spoke with neurosurgical spine consultation regarding care plan for this patient.   IMPRESSION / MDM / ASSESSMENT AND PLAN / ED COURSE  I reviewed the triage vital signs and the nursing notes.                              Differential diagnosis includes, but is not limited to, lumbar spine fracture dislocation, pelvic fracture dislocation, doubt spinal cord injury, intra-abdominal or intrathoracic injuries, intracranial or C-spine injury.    MDM: With low mechanism injury fall onto buttocks without head strike or loss of conscious or blood thinners.  No headache.  She does have some L-spine midline tenderness to palpation, imaging reveals a compression fracture of the L1 loss of height 30%, pain well controlled in the emergency department she is ambulatory without any red flag back symptoms weakness or  neurodeficits.  Dynamics appropriate and reassuring and the rest of her exam is benign, defer head imaging or other imaging at this time.  Patient would like to go home at this time, ambulating in the emergency department with pain improved with medications.  I advised her to back to the emergency department right away if she develops any signs or symptoms of any worsening.  I spoke with neurosurgery spine consultant who will follow up with the patient this week for appointment.   Patient's presentation is most consistent with acute complicated illness / injury requiring diagnostic workup.       FINAL CLINICAL IMPRESSION(S) / ED DIAGNOSES   Final diagnoses:  Fall, initial encounter  Compression fracture of L1 vertebra, initial encounter (HCC)     Rx / DC Orders   ED Discharge Orders          Ordered    lidocaine (LIDODERM) 5 %  Every 24 hours        02/01/22 1619    oxyCODONE (ROXICODONE) 5 MG immediate release tablet   Every 8 hours PRN        02/01/22 1619             Note:  This document was prepared using Dragon voice recognition software and may include unintentional dictation errors.    Pilar Jarvis, MD 02/01/22 [phone]

## 2022-02-01 NOTE — Discharge Instructions (Signed)
Take tylenol 650 mg and ibuprofen 400 mg every 6 hours for pain.   Apply a lidocaine patch to the lower back every morning.   If pain is severe despite the above treatment plan, take oxycodone as directed.   Call Dr. Katrinka Blazing for an appointment next week.   If you have any new or worsening symptoms come back to the emergency department.

## 2022-02-01 NOTE — ED Provider Triage Note (Signed)
Emergency Medicine Provider Triage Evaluation Note  Debbie Montgomery, a 76 y.o. female  was evaluated in triage.  Pt complains of mechanical fall.  Patient presents to the ED following a fall while out playing pickle ball.  She describes losing her balance falling backwards, and primarily on her buttocks and low back.  Denies any head injury or LOC.  Patient report significant pain at the time given her history of spondylosis of the lumbar spine.  She is 3 days status post lumbar spinal nerve ablation for the same.  Review of Systems  Positive: Mechanical fall. Lumbosacral pain Negative: Saddle anesthesias, foot drop, incontience  Physical Exam  BP 129/76 (BP Location: Right Arm)   Pulse 62   Temp 98.4 F (36.9 C) (Oral)   Resp 17   Ht 5\' 8"  (1.727 m)   Wt 68.5 kg   SpO2 100%   BMI 22.96 kg/m  Gen:   Awake, no distress  NAD Resp:  Normal effort CTA MSK:   Moves extremities without difficulty  NEURO: NL LE DTRs bilaterally   Medical Decision Making  Medically screening exam initiated at 2:07 PM.  Appropriate orders placed.  [place] was informed that the remainder of the evaluation will be completed by another provider, this initial triage assessment does not replace that evaluation, and the importance of remaining in the ED until their evaluation is complete.  Patient to the ED for evaluation of injury sustained following mechanical fall.  Patient is 3 days status post a spinal nerve ablation for her lumbar sacral Kalosis.   Burman Nieves, PA-C 02/01/22 1409

## 2022-02-01 NOTE — ED Triage Notes (Signed)
Patient arrived by EMS from home for fall. Reports she was playing pickle ball and fell onto buttocks. C/o buttocks pain and back pain. Denies LOC. No blood thinners. EMS reports she was able to ambulate with NAD noted

## 2022-02-05 ENCOUNTER — Telehealth: Payer: Self-pay | Admitting: Family Medicine

## 2022-02-05 ENCOUNTER — Telehealth: Payer: Self-pay

## 2022-02-05 NOTE — Telephone Encounter (Signed)
Debbie Montgomery, can you review and advise a time frame of when pt should be seen and if you want xrays on the day of the appt? Xrays were done 02/01/22.  [name], they will be a new patient.

## 2022-02-05 NOTE — Telephone Encounter (Signed)
-----   Message from Rockey Situ sent at 02/05/2022  9:16 AM EDT ----- Regarding: ER fu Contact: [phone] Compression fracture on 9/1, should she Stacy or Dr.Yarbrough. She is already seeing Dr.Chasnis for injections.

## 2022-02-05 NOTE — Telephone Encounter (Signed)
Copied from CRM [id number]. Topic: Quick Communication - See Telephone Encounter >> Feb 05, 2022  1:57 PM Pincus Sanes wrote: Pt was last seen in June and has had a bad fall, was in ED over the weekend and the hospital gave her oxyCODONE (ROXICODONE) 5 MG immediate release tablet 12 tablet 0 02/01/2022   Sig - Route: Take 1 tablet (5 mg total) by mouth every 8 (eight) hours as needed for up to 12 doses for severe pain. - Oral  Sent to pharmacy as: oxyCODONE (ROXICODONE) 5 MG immediate release tablet  Earliest Fill Date: 02/01/2022   Pt is wanting a refill as in a great deal of pain and needs a refill, FU [phone] PT HAS AN APPT ALREADY ON 9/12. oxyCODONE (ROXICODONE) 5 MG immediate release tablet 12 tablet 0 02/01/2022   Sig - Route: Take 1 tablet (5 mg total) by mouth every 8 (eight) hours as needed for up to 12 doses for severe pain. - Oral  Sent to pharmacy as: oxyCODONE (ROXICODONE) 5 MG immediate release tablet  Earliest Fill Date: 02/01/2022  E-Prescribing Status: Receipt confirmed by pharmacy (02/01/2022 4:20 PM EDT)

## 2022-02-05 NOTE — Telephone Encounter (Signed)
She confirmed appt for 02/22/2022 with Drake Leach and xrays same day.

## 2022-02-05 NOTE — Telephone Encounter (Signed)
She can f/u in 2-3 weeks. Can get repeat lumbar xrays prior to that visit.

## 2022-02-05 NOTE — Telephone Encounter (Signed)
Called pt let her know that Dr. Marcell Barlow advised pt to go to ED if she is on pain. Pt will get repeat xray in 2 weeks. Pt verbalized understanding.

## 2022-02-05 NOTE — Telephone Encounter (Signed)
Spoke to pt let her know that Delice Bison has called Dr. Marcell Barlow office and left a message. She is waiting for a call back. Told pt that she can go to the ER or UC. Pt verbalized understanding.  KP

## 2022-02-12 ENCOUNTER — Encounter: Payer: Self-pay | Admitting: Family Medicine

## 2022-02-12 ENCOUNTER — Ambulatory Visit (INDEPENDENT_AMBULATORY_CARE_PROVIDER_SITE_OTHER): Payer: Medicare Other | Admitting: Family Medicine

## 2022-02-12 ENCOUNTER — Other Ambulatory Visit: Payer: Self-pay

## 2022-02-12 VITALS — BP 120/80 | HR 68 | Ht 68.0 in | Wt 150.0 lb

## 2022-02-12 DIAGNOSIS — S32010D Wedge compression fracture of first lumbar vertebra, subsequent encounter for fracture with routine healing: Secondary | ICD-10-CM | POA: Diagnosis not present

## 2022-02-12 DIAGNOSIS — Z8781 Personal history of (healed) traumatic fracture: Secondary | ICD-10-CM

## 2022-02-12 MED ORDER — TRAMADOL HCL 50 MG PO TABS: 50.0000 mg | ORAL_TABLET | Freq: Three times a day (TID) | ORAL | 0 refills | Status: AC | PRN

## 2022-02-12 NOTE — Progress Notes (Addendum)
Date:  02/12/2022   Name:  Oneika Simonian [facility]   DOB:  [birth year]   MRN:  [id number]   Chief Complaint: Back Pain (Has compression Fx in lower back on 02/01/22 and is seeing ortho/ Cari Caraway this Thursday)  Back Pain This is a chronic problem. The current episode started 1 to 4 weeks ago. The problem occurs constantly. The problem has been waxing and waning since onset. The pain is present in the lumbar spine. The quality of the pain is described as aching. The pain is moderate. The symptoms are aggravated by bending, sitting and position. Pertinent negatives include no bladder incontinence, bowel incontinence, chest pain, leg pain, paresthesias or tingling. Risk factors include recent trauma. She has tried NSAIDs for the symptoms. The treatment provided mild (not sufficient) relief.    Lab Results  Component Value Date   NA 142 05/10/2021   K 4.4 05/10/2021   CO2 25 05/10/2021   GLUCOSE 94 05/10/2021   BUN 15 05/10/2021   CREATININE 0.63 05/10/2021   CALCIUM 9.4 05/10/2021   EGFR 94 05/10/2021   GFRNONAA 89 12/17/2019   Lab Results  Component Value Date   CHOL 161 07/03/2021   HDL 71 07/03/2021   LDLCALC 76 07/03/2021   TRIG 76 07/03/2021   Lab Results  Component Value Date   TSH 1.25 06/25/2019   No results found for: "HGBA1C" Lab Results  Component Value Date   WBC 7.0 05/10/2021   HGB 13.4 05/10/2021   HCT 40.0 05/10/2021   MCV 92 05/10/2021   PLT 238 05/10/2021   Lab Results  Component Value Date   ALT 14 07/03/2021   AST 19 07/03/2021   ALKPHOS 39 (L) 07/03/2021   BILITOT 0.5 07/03/2021   No results found for: "25OHVITD2", "25OHVITD3", "VD25OH"   Review of Systems  Constitutional:  Negative for unexpected weight change.  HENT:  Negative for trouble swallowing.   Respiratory:  Negative for chest tightness and wheezing.   Cardiovascular:  Negative for chest pain, palpitations and leg swelling.  Gastrointestinal:  Negative for blood in stool and bowel  incontinence.  Endocrine: Negative for polydipsia and polyuria.  Genitourinary:  Negative for bladder incontinence.  Musculoskeletal:  Positive for back pain.  Neurological:  Negative for tingling and paresthesias.    Patient Active Problem List   Diagnosis Date Noted  . Family history of melanoma 12/16/2019  . 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 High blood pressure 07/01/2008    No Known Allergies  Past Surgical History:  Procedure Laterality Date  . ABDOMINAL HYSTERECTOMY    . bladder lift  1995  . COLONOSCOPY    . EYE SURGERY     lasix  . LASER ABLATION Right   . TONSILLECTOMY AND ADENOIDECTOMY    . TUBAL LIGATION      Social History   Tobacco Use  . Smoking status: Former  . Smokeless tobacco: Never  Vaping Use  . Vaping Use: Never used  Substance Use Topics  . Alcohol use: Yes    Comment: ocassionally  . Drug use: Never     Medication list has been reviewed and updated.  Current Meds  Medication Sig  . atorvastatin (LIPITOR) 10 MG tablet Take 1 tablet (10 mg total) by mouth daily.  Marland Kitchen FLUoxetine (PROZAC) 20 MG tablet Take 1 tablet (20 mg total) by mouth daily.  . hydrochlorothiazide (HYDRODIURIL) 12.5 MG tablet TAKE 1 TABLET BY MOUTH EVERY DAY  . hydrochlorothiazide (MICROZIDE) 12.5  MG capsule TAKE 1 CAPSULE BY MOUTH EVERY DAY  . Lactobacillus (PROBIOTIC ACIDOPHILUS PO) Take by mouth.  . lidocaine (LIDODERM) 5 % Place 1 patch onto the skin daily. Remove & Discard patch within 12 hours or as directed by MD  . METAMUCIL FIBER PO Take by mouth.  . valACYclovir (VALTREX) 1000 MG tablet Take 1,000 mg by mouth daily. Dr Corley/ Derm  . [DISCONTINUED] Omega-3 Fatty Acids (OMEGA-3 FISH OIL PO) Take 1 capsule by mouth in the morning and at bedtime.       11/08/2021    9:03 AM 10/24/2020   10:08 AM 06/22/2020    1:18 PM 12/17/2019    9:43 AM  GAD 7 : Generalized Anxiety Score  Nervous, Anxious, on Edge 0 0 0 0  Control/stop worrying 0 0 0 0   Worry too much - different things 0 0 0 0  Trouble relaxing 0 0 0 0  Restless 0 0 0 0  Easily annoyed or irritable 0 0 0 0  Afraid - awful might happen 0 0 0 0  Total GAD 7 Score 0 0 0 0  Anxiety Difficulty Not difficult at all          11/08/2021    9:03 AM 10/24/2020   10:08 AM 06/22/2020    1:18 PM  Depression screen PHQ 2/9  Decreased Interest 0 0 0  Down, Depressed, Hopeless 0 0 0  PHQ - 2 Score 0 0 0  Altered sleeping 0 0 0  Tired, decreased energy 1 0 0  Change in appetite 0 0 0  Feeling bad or failure about yourself  0 0 0  Trouble concentrating 0 0 0  Moving slowly or fidgety/restless 0 0 0  Suicidal thoughts 0 0 0  PHQ-9 Score 1 0 0  Difficult doing work/chores Not difficult at all      BP Readings from Last 3 Encounters:  02/12/22 120/80  02/01/22 129/76  11/08/21 128/86    Physical Exam Vitals and nursing note reviewed.  HENT:     Right Ear: Tympanic membrane normal.     Left Ear: Tympanic membrane normal.     Mouth/Throat:     Mouth: Mucous membranes are moist.  Eyes:     Pupils: Pupils are equal, round, and reactive to light.  Cardiovascular:     Heart sounds: No murmur heard.    No friction rub. No gallop.  Pulmonary:     Breath sounds: No wheezing, rhonchi or rales.  Abdominal:     Tenderness: There is no abdominal tenderness.  Musculoskeletal:     Cervical back: Normal range of motion.     Lumbar back: Tenderness present. No deformity. Decreased range of motion. Negative right straight leg raise test and negative left straight leg raise test.    Wt Readings from Last 3 Encounters:  02/12/22 150 lb (68 kg)  02/01/22 151 lb (68.5 kg)  11/08/21 155 lb (70.3 kg)    BP 120/80   Pulse 68   Ht 5' 8"  (1.727 m)   Wt 150 lb (68 kg)   BMI 22.81 kg/m   Assessment and Plan:  1. Compression fracture of L1 vertebra with routine healing, subsequent encounter new onset. New onset.  Persistent.  Uncontrolled.  Stable with reduced activity and pain  medication.  May continue with the anti-inflammatory as tolerated.  Until she is seen by her orthopedist we will refill her tramadol 50 mg 1 every 8 hours as needed severe pain.  Patient will take caution with fall circumstances until evaluation.  - traMADol (ULTRAM) 50 MG tablet; Take 1 tablet (50 mg total) by mouth every 8 (eight) hours as needed for up to 5 days.  Dispense: 15 tablet; Refill: 0    Otilio Miu, MD

## 2022-02-14 ENCOUNTER — Ambulatory Visit
Admission: RE | Admit: 2022-02-14 | Discharge: 2022-02-14 | Disposition: A | Discharge status: Home / Self Care | Payer: Medicare Other | Attending: Orthopedic Surgery | Admitting: Orthopedic Surgery

## 2022-02-14 ENCOUNTER — Ambulatory Visit
Admission: RE | Admit: 2022-02-14 | Discharge: 2022-02-14 | Disposition: A | Discharge status: Home / Self Care | Payer: Medicare Other | Source: Ambulatory Visit | Attending: Orthopedic Surgery | Admitting: Orthopedic Surgery

## 2022-02-14 ENCOUNTER — Ambulatory Visit: Payer: Medicare Other | Admitting: Orthopedic Surgery

## 2022-02-14 DIAGNOSIS — Z8781 Personal history of (healed) traumatic fracture: Secondary | ICD-10-CM | POA: Insufficient documentation

## 2022-02-18 ENCOUNTER — Other Ambulatory Visit: Payer: Self-pay

## 2022-02-18 NOTE — Progress Notes (Unsigned)
Referring Physician:  Juline Patch, MD [address] [place],  [place] [address]  Primary Physician:  Juline Patch, MD  History of Present Illness: Seen in ED on 02/01/22 for back pian s/p fall while playing pickle ball. Diagnosed with L1 compression fracture. Discharged on lidoderm patch and oxycodone 5mg .   She is here for follow up.     Check XR report, ?progression of fracture?***    Duration: *** Location: *** Quality: *** Severity: ***  Precipitating: aggravated by *** Modifying factors: made better by *** Weakness: none Timing: *** Bowel/Bladder Dysfunction: none  Conservative measures:  Physical therapy: ***  Multimodal medical therapy including regular antiinflammatories: ***  Injections: *** epidural steroid injections  Past Surgery: ***  Valeta Harms Sabala has ***no symptoms of cervical myelopathy.  The symptoms are causing a significant impact on the patient's life.   Review of Systems:  A 10 point review of systems is negative, except for the pertinent positives and negatives detailed in the HPI.  Past Medical History: Past Medical History:  Diagnosis Date   Hypertension     Past Surgical History: Past Surgical History:  Procedure Laterality Date   ABDOMINAL HYSTERECTOMY     bladder lift  1995   COLONOSCOPY     EYE SURGERY     lasix   LASER ABLATION Right    TONSILLECTOMY AND ADENOIDECTOMY     TUBAL LIGATION      Allergies: Allergies as of 02/19/2022   (No Known Allergies)    Medications: Outpatient Encounter Medications as of 02/19/2022  Medication Sig   atorvastatin (LIPITOR) 10 MG tablet Take 1 tablet (10 mg total) by mouth daily.   FLUoxetine (PROZAC) 20 MG tablet Take 1 tablet (20 mg total) by mouth daily.   hydrochlorothiazide (HYDRODIURIL) 12.5 MG tablet TAKE 1 TABLET BY MOUTH EVERY DAY   hydrochlorothiazide (MICROZIDE) 12.5 MG capsule TAKE 1 CAPSULE BY MOUTH EVERY DAY   Lactobacillus (PROBIOTIC ACIDOPHILUS  PO) Take by mouth.   lidocaine (LIDODERM) 5 % Place 1 patch onto the skin daily. Remove & Discard patch within 12 hours or as directed by MD   METAMUCIL FIBER PO Take by mouth.   oxyCODONE (ROXICODONE) 5 MG immediate release tablet Take 1 tablet (5 mg total) by mouth every 8 (eight) hours as needed for up to 12 doses for severe pain. (Patient not taking: Reported on 02/12/2022)   valACYclovir (VALTREX) 1000 MG tablet Take 1,000 mg by mouth daily. Dr Corley/ Derm   No facility-administered encounter medications on file as of 02/19/2022.    Social History: Social History   Tobacco Use   Smoking status: Former   Smokeless tobacco: Never  Scientific laboratory technician Use: Never used  Substance Use Topics   Alcohol use: Yes    Comment: ocassionally   Drug use: Never    Family Medical History: Family History  Problem Relation Age of Onset   Hypertension Mother    Heart disease Father    Heart attack Maternal Grandmother    Stroke Maternal Grandmother     Physical Examination: There were no vitals filed for this visit.  General: Patient is well developed, well nourished, calm, collected, and in no apparent distress. Attention to examination is appropriate.  Respiratory: Patient is breathing without any difficulty.   NEUROLOGICAL:     Awake, alert, oriented to person, place, and time.  Speech is clear and fluent. Fund of knowledge is appropriate.   Cranial Nerves: Pupils equal  round and reactive to light.  Facial tone is symmetric.  Facial sensation is symmetric.  ROM of spine:  *** ROM of cervical spine *** pain *** ROM of lumbar spine *** pain  No abnormal lesions on exposed skin.   Strength: Side Biceps Triceps Deltoid Interossei Grip Wrist Ext. Wrist Flex.  R 5 5 5 5 5 5 5   L 5 5 5 5 5 5 5    Side Iliopsoas Quads Hamstring PF DF EHL  R 5 5 5 5 5 5   L 5 5 5 5 5 5    Reflexes are ***2+ and symmetric at the biceps, triceps, brachioradialis, patella and achilles.   Hoffman's  is absent.  Clonus is not present.   Bilateral upper and lower extremity sensation is intact to light touch.    No evidence of dysmetria noted.  Gait is normal.   ***No difficulty with tandem gait.    Medical Decision Making  Imaging: Lumbar xrays 02/16/22:  FINDINGS: There has been increased compression at L1 when compared with the prior exam now at approximately 50% height loss. Remainder of the lumbar vertebra are within normal limits. Disc space narrowing is noted at L4-5 and L5-S1 stable from the prior exam. Anterolisthesis of L5 on S1 is noted and stable.   IMPRESSION: Interval increase in the degree of compression deformity at L1 when compared with the prior exam.     Electronically Signed   By: M.D.   On: 02/16/2022 00:51  I have personally reviewed the images and agree with the above interpretation.  Assessment and Plan: Ms. Rotert is a pleasant 76 y.o. female with ***  Above treatment options discussed with patient and following plan made:   - Order for physical therapy for *** spine ***. - Continue on current medications including ***. Reviewed proper dosing along with risks and benefits. Take and NSAIDs with food.      I spent a total of *** minutes in face-to-face and non-face-to-face activities related to this patient's care today.  Thank you for involving me in the care of this patient.   Alcide Clever PA-C Dept. of Neurosurgery

## 2022-02-19 ENCOUNTER — Encounter: Payer: Self-pay | Admitting: Orthopedic Surgery

## 2022-02-19 ENCOUNTER — Ambulatory Visit (INDEPENDENT_AMBULATORY_CARE_PROVIDER_SITE_OTHER): Payer: Medicare Other | Admitting: Orthopedic Surgery

## 2022-02-19 VITALS — BP 130/82 | Ht 68.0 in | Wt 147.0 lb

## 2022-02-19 DIAGNOSIS — S32010A Wedge compression fracture of first lumbar vertebra, initial encounter for closed fracture: Secondary | ICD-10-CM | POA: Diagnosis not present

## 2022-02-19 MED ORDER — HYDROCODONE-ACETAMINOPHEN 5-325 MG PO TABS: 1.0000 | ORAL_TABLET | Freq: Three times a day (TID) | ORAL | 0 refills | Status: DC | PRN

## 2022-02-19 NOTE — Patient Instructions (Addendum)
[facility] will contact you to schedule an appointment to dispense the brace [phone]  Do not wear brace while sleeping.

## 2022-02-22 ENCOUNTER — Ambulatory Visit: Payer: Medicare Other | Admitting: Orthopedic Surgery

## 2022-03-15 ENCOUNTER — Other Ambulatory Visit: Payer: Self-pay | Admitting: Family Medicine

## 2022-03-15 DIAGNOSIS — I10 Essential (primary) hypertension: Secondary | ICD-10-CM

## 2022-03-15 NOTE — Telephone Encounter (Signed)
Unable to refill per protocol, Rx expired.Medication was discontinued 11/08/21. Will refuse.  Requested Prescriptions  Pending Prescriptions Disp Refills  . hydrochlorothiazide (MICROZIDE) 12.5 MG capsule [Pharmacy Med Name: HYDROCHLOROTHIAZIDE 12.5 MG CP] 90 capsule 1    Sig: TAKE 1 CAPSULE BY MOUTH EVERY DAY     Cardiovascular: Diuretics - Thiazide Failed - 03/15/2022  1:37 AM      Failed - Cr in normal range and within 180 days    Creatinine, Ser  Date Value Ref Range Status  05/10/2021 0.63 0.57 - 1.00 mg/dL Final         Failed - K in normal range and within 180 days    Potassium  Date Value Ref Range Status  05/10/2021 4.4 3.5 - 5.2 mmol/L Final         Failed - Na in normal range and within 180 days    Sodium  Date Value Ref Range Status  05/10/2021 142 134 - 144 mmol/L Final         Passed - Last BP in normal range    BP Readings from Last 1 Encounters:  02/19/22 130/82         Passed - Valid encounter within last 6 months    Recent Outpatient Visits          1 month ago Compression fracture of L1 vertebra with routine healing, subsequent encounter   [facility] [place], Deanna C, MD   4 months ago Essential hypertension   [facility] [place], Deanna C, MD   10 months ago Essential hypertension   [facility] [place], Deanna C, MD   1 year ago Essential hypertension   [facility] [place], Deanna C, MD   1 year ago Essential hypertension    [facility] [place], [place], MD      Future Appointments            In 1 month Juline Patch, MD [place] [facility]

## 2022-03-20 ENCOUNTER — Other Ambulatory Visit: Payer: Self-pay

## 2022-03-20 ENCOUNTER — Ambulatory Visit
Admission: RE | Admit: 2022-03-20 | Discharge: 2022-03-20 | Disposition: A | Discharge status: Home / Self Care | Payer: Medicare Other | Source: Ambulatory Visit | Attending: Orthopedic Surgery | Admitting: Orthopedic Surgery

## 2022-03-20 ENCOUNTER — Ambulatory Visit
Admission: RE | Admit: 2022-03-20 | Discharge: 2022-03-20 | Disposition: A | Discharge status: Home / Self Care | Payer: Medicare Other | Attending: Orthopedic Surgery | Admitting: Orthopedic Surgery

## 2022-03-20 DIAGNOSIS — S32010A Wedge compression fracture of first lumbar vertebra, initial encounter for closed fracture: Secondary | ICD-10-CM | POA: Insufficient documentation

## 2022-03-20 DIAGNOSIS — R102 Pelvic and perineal pain: Secondary | ICD-10-CM | POA: Insufficient documentation

## 2022-03-20 NOTE — Progress Notes (Signed)
Referring Physician:  No referring provider defined for this encounter.  Primary Physician:  Juline Patch, MD  History of Present Illness: Last seen be me on 02/19/22 for L1 compression fracture s/p fall on 02/01/22 while playing pickle ball.   She was to start wearing LSO brace after last visit and she was given norco. We discussed possible MRI and referral for kyphoplasty if she was not feeling better.   She is here for follow up.   She is wearing her LSO brace and it is helping, but she continues with persistent back pain. Her pain is more severe as the day progresses. She is still having to lay down and rest in the afternoon due to pain. She has been walking with her brace. She has a thoracic brace she got OTC that helps her stand up straight.   No radiation of pain to her arms/legs. No numbness, tingling or weakness.  Scheduled to go on a trip to [place] first week of December.   Review of Systems:  A 10 point review of systems is negative, except for the pertinent positives and negatives detailed in the HPI.  Past Medical History: Past Medical History:  Diagnosis Date   Hypertension     Past Surgical History: Past Surgical History:  Procedure Laterality Date   ABDOMINAL HYSTERECTOMY     bladder lift  1995   COLONOSCOPY     EYE SURGERY     lasix   LASER ABLATION Right    TONSILLECTOMY AND ADENOIDECTOMY     TUBAL LIGATION      Allergies: Allergies as of 03/22/2022   (No Known Allergies)    Medications: Outpatient Encounter Medications as of 03/22/2022  Medication Sig   atorvastatin (LIPITOR) 10 MG tablet Take 1 tablet (10 mg total) by mouth daily.   FLUoxetine (PROZAC) 20 MG tablet Take 1 tablet (20 mg total) by mouth daily.   hydrochlorothiazide (HYDRODIURIL) 12.5 MG tablet TAKE 1 TABLET BY MOUTH EVERY DAY   Lactobacillus (PROBIOTIC ACIDOPHILUS PO) Take by mouth.   METAMUCIL FIBER PO Take by mouth.   valACYclovir (VALTREX) 1000 MG tablet Take 1,000 mg by  mouth daily. Dr Army Melia Derm   [DISCONTINUED] HYDROcodone-acetaminophen (NORCO) 5-325 MG tablet Take 1 tablet by mouth every 8 (eight) hours as needed for severe pain.   No facility-administered encounter medications on file as of 03/22/2022.    Social History: Social History   Tobacco Use   Smoking status: Former   Smokeless tobacco: Never  Scientific laboratory technician Use: Never used  Substance Use Topics   Alcohol use: Yes    Comment: ocassionally   Drug use: Never    Family Medical History: Family History  Problem Relation Age of Onset   Hypertension Mother    Heart disease Father    Heart attack Maternal Grandmother    Stroke Maternal Grandmother     Physical Examination: Vitals:   03/22/22 1435  BP: 130/82    General: Patient is well developed, well nourished, calm, collected, and in no apparent distress. Attention to examination is appropriate.  Respiratory: Patient is breathing without any difficulty.   NEUROLOGICAL:     Awake, alert, oriented to person, place, and time.  Speech is clear and fluent. Fund of knowledge is appropriate.   Cranial Nerves: Pupils equal round and reactive to light.  Facial tone is symmetric.  Facial sensation is symmetric.  ROM of lumbar spine not tested.   No abnormal lesions on exposed skin.  She has tenderness over area of L1.   Side Iliopsoas Quads Hamstring PF DF EHL  R 5 5 5 5 5 5   L 5 5 5 5 5 5    Bilateral lower extremity sensation is intact to light touch.     Gait is normal.   Medical Decision Making  Imaging: Lumbar xrays dated 03/20/22:  FINDINGS: L1 compression fracture with significant anterior wedging. This is estimated at 60% when compared to the normal vertebral body on 11/03/2019. This has increased slightly since the recent films from 02/14/2022 where it was approximately 50%. Stable slight retropulsion of the posteroinferior aspect of the vertebral body.   No new/acute findings. 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   IMPRESSION: 1. 60% L1 compression fracture, slightly increased compared to the recent films from 02/14/2022 where it was approximately 50%. 2. No new/acute findings.     Electronically Signed   By: Marijo Sanes M.D.   On: 03/22/2022 08:48  I have personally reviewed the images and agree with the above interpretation.  Assessment and Plan: Ms. Debbie Montgomery is a pleasant 76 y.o. female with L1 compression fracture s/p fall 02/01/22. She has persistent LBP. No radiation of pain into arms/legs. No numbness, tingling, or weakness.   Xrays from today show progression of fracture.   Treatment options discussed with patient and following plan made:   - With progression of fracture on xrays and persistent pain, MRI of lumbar spine ordered for consideration of kyphoplasty.  - Continue with LSO brace. No bending, twisting, or lifting.  - Will call her with MRI results. If appropriate, will refer to IR for evaluation for kyphoplasty. Scheduled for trip to [place] first week in December and would like to get this done as soon as possible.   I spent a total of 20 minutes in face-to-face and non-face-to-face activities related to this patient's care today.  Thank you for involving me in the care of this patient.   Geronimo Boot PA-C Dept. of Neurosurgery

## 2022-03-22 ENCOUNTER — Encounter: Payer: Self-pay | Admitting: Orthopedic Surgery

## 2022-03-22 ENCOUNTER — Ambulatory Visit (INDEPENDENT_AMBULATORY_CARE_PROVIDER_SITE_OTHER): Payer: Medicare Other | Admitting: Orthopedic Surgery

## 2022-03-22 VITALS — BP 130/82 | Ht 68.0 in | Wt 147.0 lb

## 2022-03-22 DIAGNOSIS — S32010A Wedge compression fracture of first lumbar vertebra, initial encounter for closed fracture: Secondary | ICD-10-CM

## 2022-03-22 DIAGNOSIS — S32010D Wedge compression fracture of first lumbar vertebra, subsequent encounter for fracture with routine healing: Secondary | ICD-10-CM | POA: Diagnosis not present

## 2022-03-25 ENCOUNTER — Telehealth: Payer: Self-pay

## 2022-03-25 NOTE — Telephone Encounter (Signed)
Okay to extend her time out of work. I will put for next 4 weeks for now. Can you please do the note and let her know?   Thanks!

## 2022-03-25 NOTE — Telephone Encounter (Signed)
-----   Message from Peggyann Shoals sent at 03/25/2022 11:52 AM EDT ----- Regarding: work note Contact: [phone] She was seen on Friday and forgot to ask for a new work note to extend her time off. She wants to be out of work until she has her MRI and reviews results with Stacy. She will let Marzetta Board decide how much additional time she needs to be off. She will access the note through mychart.

## 2022-03-25 NOTE — Telephone Encounter (Signed)
Patient has been notified about her work note via Pharmacist, community.

## 2022-03-26 ENCOUNTER — Ambulatory Visit
Admission: RE | Admit: 2022-03-26 | Discharge: 2022-03-26 | Disposition: A | Discharge status: Home / Self Care | Payer: Medicare Other | Source: Ambulatory Visit | Attending: Orthopedic Surgery | Admitting: Orthopedic Surgery

## 2022-03-26 DIAGNOSIS — S32010A Wedge compression fracture of first lumbar vertebra, initial encounter for closed fracture: Secondary | ICD-10-CM | POA: Diagnosis present

## 2022-03-27 ENCOUNTER — Telehealth: Payer: Self-pay

## 2022-03-27 DIAGNOSIS — S32010A Wedge compression fracture of first lumbar vertebra, initial encounter for closed fracture: Secondary | ICD-10-CM

## 2022-03-27 NOTE — Telephone Encounter (Signed)
-----   Message from Peggyann Shoals sent at 03/27/2022  9:21 AM EDT ----- Regarding: MRI Contact: [phone] Patient had her MRI yesterday. She would like to review the results with you to make sure that she is can still keep her cruise to [place] in December.

## 2022-03-28 NOTE — Telephone Encounter (Signed)
MRI of lumbar spine dated 03/26/22:  FINDINGS: Segmentation: There is transitional anatomy. In keeping with prior numbering convention the compression deformity is localized to the L1 vertebral body. There is lumbarization of S1 with the last well-formed disc space at S1-S2.   Alignment:  Trace anterolisthesis of L4 on L5.   Vertebrae: Redemonstrated is a compression deformity at the L1 vertebral body level which demonstrates T2/stir hyperintense signal abnormality, which is suggestive of vertebral body edema, which can be seen in the setting of an acute process. There is mild posterior bulging of the inferior endplate without significant spinal canal stenosis. There is no evidence of an epidural hematoma.   Conus medullaris and cauda equina: Conus extends to the L1 level. Conus and cauda equina appear normal.   Paraspinal and other soft tissues: There is a T2 hyperintense lesion of the inferior pole of the right kidney, most likely a small renal cyst which does not require further workup.   Disc levels:   T11-T12: Only imaged in the sagittal plane. No evidence of spinal canal or neural foraminal stenosis.   T12-L1: No disc bulge. No significant facet degenerative change. No spinal canal stenosis. No neural foraminal stenosis.   L1-L2: Mild bilateral facet degenerative change. No spinal canal stenosis. No neural foraminal stenosis.   L2-L3: Mild bilateral facet degenerative change. Mild ligamentum flavum hypertrophy. No spinal canal stenosis. No neural foraminal stenosis.   L3-L4: Mild bilateral facet degenerative change. Minimal disc bulge. No spinal canal stenosis. No neural foraminal stenosis.   L4-L5: Moderate bilateral facet degenerative change. Ligamentum flavum hypertrophy. Right paracentral disc protrusion. There is mild narrowing of the lateral right lateral recess. No overall spinal canal stenosis. Is mild right neural foraminal stenosis.   L5-S1: Severe  bilateral facet degenerative change. Grade 1 anterolisthesis. Minimal disc bulge. No spinal canal stenosis. Mild left neural foraminal stenosis.   S1-S2: Mild bilateral facet degenerative change. No spinal canal stenosis. No neural foraminal stenosis.   IMPRESSION: 1. In keeping with prior numbering convention, the compression deformity is localized to the L1 vertebral body. There is vertebral body edema associated with the L1 vertebral body, which can be seen in the setting of an acute on chronic process. There is mild posterior bulging of the inferior endplate without significant spinal canal stenosis. No evidence of an epidural hematoma. 2. Multilevel degenerative changes as above without high-grade spinal canal or neural foraminal stenosis.     Electronically Signed   By: Marin Roberts M.D.   On: 03/27/2022 11:09    I have personally reviewed the images and agree with the above interpretation.  Above MRI reviewed with Dr. Izora Ribas. He thinks she should be a candidate for kyphoplasty.   Spoke with patient and discussed MRI results. Will place order for consult to IR for evaluation of L1 kyphoplasty. She will let me know when she has seen IR to keep me updated.

## 2022-03-29 ENCOUNTER — Other Ambulatory Visit: Payer: Self-pay | Admitting: Orthopedic Surgery

## 2022-03-29 DIAGNOSIS — S32010A Wedge compression fracture of first lumbar vertebra, initial encounter for closed fracture: Secondary | ICD-10-CM

## 2022-03-29 NOTE — Addendum Note (Signed)
Addended byGeronimo Boot on: 03/29/2022 08:39 AM   Modules accepted: Orders

## 2022-04-01 ENCOUNTER — Encounter (INDEPENDENT_AMBULATORY_CARE_PROVIDER_SITE_OTHER): Payer: Self-pay

## 2022-04-02 ENCOUNTER — Ambulatory Visit
Admission: RE | Admit: 2022-04-02 | Discharge: 2022-04-02 | Disposition: A | Discharge status: Home / Self Care | Payer: Medicare Other | Source: Ambulatory Visit | Attending: Orthopedic Surgery | Admitting: Orthopedic Surgery

## 2022-04-02 ENCOUNTER — Other Ambulatory Visit: Payer: Self-pay | Admitting: Interventional Radiology

## 2022-04-02 VITALS — BP 152/80 | HR 78 | Temp 98.3°F | Wt 149.0 lb

## 2022-04-02 DIAGNOSIS — S32010A Wedge compression fracture of first lumbar vertebra, initial encounter for closed fracture: Secondary | ICD-10-CM

## 2022-04-02 DIAGNOSIS — S32010G Wedge compression fracture of first lumbar vertebra, subsequent encounter for fracture with delayed healing: Secondary | ICD-10-CM

## 2022-04-02 HISTORY — PX: IR RADIOLOGIST EVAL & MGMT: IMG5224

## 2022-04-02 LAB — CBC
HCT: 39.1 % (ref 35.0–45.0)
Hemoglobin: 13.5 g/dL (ref 11.7–15.5)
MCH: 32.9 pg (ref 27.0–33.0)
MCHC: 34.5 g/dL (ref 32.0–36.0)
MCV: 95.4 fL (ref 80.0–100.0)
MPV: 10.8 fL (ref 7.5–12.5)
Platelets: 245 10*3/uL (ref 140–400)
RBC: 4.1 10*6/uL (ref 3.80–5.10)
RDW: 12.1 % (ref 11.0–15.0)
WBC: 8 10*3/uL (ref 3.8–10.8)

## 2022-04-02 LAB — BASIC METABOLIC PANEL WITH GFR
BUN/Creatinine Ratio: 45 (calc) — ABNORMAL HIGH (ref 6–22)
BUN: 21 mg/dL (ref 7–25)
CO2: 26 mmol/L (ref 20–32)
Calcium: 9.5 mg/dL (ref 8.6–10.4)
Chloride: 107 mmol/L (ref 98–110)
Creat: 0.47 mg/dL — ABNORMAL LOW (ref 0.60–1.00)
Glucose, Bld: 103 mg/dL (ref 65–139)
Potassium: 4 mmol/L (ref 3.5–5.3)
Sodium: 140 mmol/L (ref 135–146)
eGFR: 100 mL/min/{1.73_m2} (ref >=60)

## 2022-04-02 NOTE — H&P (Signed)
Interventional Radiology - Clinic Visit, Initial H&P    Referring Provider: Geronimo Boot, PA-C  Reason for Visit: L1 compression fracture     History of Present Illness  Debbie Montgomery is a 76 y.o. female seen today in Interventional Radiology clinic for L1 compression fracture.  The patient reports that she fell backwards while playing pickleball on February 01, 2022.  Patient was evaluated in the emergency room where radiographs of her spine demonstrated acute compression fracture of the L1 vertebral body.  The patient was discharged and eventually had follow-up with a spine specialist, who then prescribed a back brace and narcotic pain medication for the patient.  The patient has been using the back brace consistently for the last 5 weeks.    Patient reports that her pain is worse throughout the day, getting to 9/10 severity in the evenings.  With rest and prescription pain medication (tramadol, hydrocodone), the pain does improve to 4/10.  She describes severe disability on the [place] disability questionnaire with 19/24 positive.   Lumbar spine radiograph on February 01, 2022 described 30% height loss of the L1 vertebral body.  Follow-up radiograph on October 18th 2023 demonstrated progressive 60% height loss of the L1 vertebral body.  MRI lumbar spine on March 26, 2022 demonstrated subacute fracture of the L1 vertebral body with ongoing edema and inflammation.     Additional Past Medical History Past Medical History:  Diagnosis Date   Hypertension      Surgical History  Past Surgical History:  Procedure Laterality Date   ABDOMINAL HYSTERECTOMY     bladder lift  1995   COLONOSCOPY     EYE SURGERY     lasix   LASER ABLATION Right    TONSILLECTOMY AND ADENOIDECTOMY     TUBAL LIGATION       Medications  I have reviewed the current medication list. Refer to chart for details. Current Outpatient Medications  Medication Instructions   atorvastatin (LIPITOR) 10  mg, Oral, Daily   FLUoxetine (PROZAC) 20 mg, Oral, Daily   hydrochlorothiazide (HYDRODIURIL) 12.5 MG tablet TAKE 1 TABLET BY MOUTH EVERY DAY   Lactobacillus (PROBIOTIC ACIDOPHILUS PO) Oral   METAMUCIL FIBER PO Oral   valACYclovir (VALTREX) 1,000 mg, Oral, Daily, Dr Corley/ Derm      Allergies No Known Allergies Does patient have contrast allergy: No     Physical Exam Current Vitals Temp: 98.3 F (36.8 C) ( )  Pulse Rate: 78     BP: (!) 152/80  SpO2: 96 %     Weight: 67.6 kg  Body mass index is 22.66 kg/m.  General: Alert and answers questions appropriately.  Cardiac: Regular rate. No dependent edema. Pulmonary: Normal work of breathing. On room air. Back: Tenderness over lower back.    Pertinent Lab Results    Latest Ref Rng & Units 05/10/2021   11:00 AM  CBC  WBC 3.4 - 10.8 x10E3/uL 7.0   Hemoglobin 11.1 - 15.9 g/dL 13.4   Hematocrit 34.0 - 46.6 % 40.0   Platelets 150 - 450 x10E3/uL 238       Latest Ref Rng & Units 07/03/2021   10:31 AM 05/10/2021   11:00 AM 12/17/2019   10:50 AM  CMP  Glucose 70 - 99 mg/dL  94  96   BUN 8 - 27 mg/dL  15  19   Creatinine 0.57 - 1.00 mg/dL  0.63  0.66   Sodium 134 - 144 mmol/L  142  140   Potassium  3.5 - 5.2 mmol/L  4.4  4.2   Chloride 96 - 106 mmol/L  102  101   CO2 20 - 29 mmol/L  25  27   Calcium 8.7 - 10.3 mg/dL  9.4  9.4   Total Protein 6.0 - 8.5 g/dL 6.9  6.7    Total Bilirubin 0.0 - 1.2 mg/dL 0.5  0.3    Alkaline Phos 44 - 121 IU/L 39  35    AST 0 - 40 IU/L 19  17    ALT 0 - 32 IU/L 14  9        Relevant and/or Recent Imaging: MRI Lumbar spine 03/26/2022  FINDINGS: L1 compression fracture with significant anterior wedging. This is estimated at 60% when compared to the normal vertebral body on 11/03/2019. This has increased slightly since the recent films from 02/14/2022 where it was approximately 50%. Stable slight retropulsion of the posteroinferior aspect of the vertebral body. No new/acute findings.  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 IMPRESSION: 1. 60% L1 compression fracture, slightly increased compared to the recent films from 02/14/2022 where it was approximately 50%. 2. No new/acute findings. Electronically Signed By: Marijo Sanes M.D. On: 03/22/2022 08:48   Serial lumbar spine XR as described in the HPI, personally reviewed. Patient appears osteopenic.   DEXA scan 2019  Report reviewed, patient had 1-year risk of major osteoporotic fracture of 11.3%.     Assessment & Plan:   Patient has suffered subacute osteoporotic fracture of the L1 vertebra following a fall.   Serial imaging over the last 2 months demonstrate progressive height loss of the L1 vertebral body, and patient remains symptomatic despite appropriate conservative management.   History and exam have demonstrated the following:  Acute/Subacute fracture by imaging dated 03/26/2022, Pain on exam concordant with level of fracture, Failure of conservative therapy and pain refractory to narcotic pain mediation, and Significant disability on the [place] with 19/24 positive symptoms, reflecting significant impact/impairment of (ADLs)   ICD-10-CM Codes that Support Medical Necessity ([email or web address])  M80.08XA    Age-related osteoporosis with current pathological fracture, vertebra(e), initial encounter for fracture   Plan:  L1 vertebral body augmentation with balloon kyphoplasty  Post-procedure disposition: outpatient DRI-A  Medication holds: aspirin-containing medications  The patient has suffered a fracture of the L1 vertebral body. It is recommended that patients aged 47 years or older be evaluated for possible testing or treatment of osteoporosis. A copy of this consult report is sent to the patient's referring physician.  Advanced Care Plan: The  patient did not want to provide an [place] at the time of this visit     Total time spent on today's visit was over 60 Minutes, including both face-to-face time and non face-to-face time, personally spent on review of chart (including labs and relevant imaging), discussing further workup and treatment options, referral to specialist if needed, reviewing outside records if pertinent, answering patient questions, and coordinating care regarding L1 fracture as well as management strategy.      Albin Felling, MD  Vascular and Interventional Radiology 04/02/2022 2:33 PM

## 2022-04-09 ENCOUNTER — Ambulatory Visit
Admission: RE | Admit: 2022-04-09 | Discharge: 2022-04-09 | Disposition: A | Discharge status: Home / Self Care | Payer: Medicare Other | Source: Ambulatory Visit | Attending: Orthopedic Surgery | Admitting: Orthopedic Surgery

## 2022-04-09 DIAGNOSIS — S32010A Wedge compression fracture of first lumbar vertebra, initial encounter for closed fracture: Secondary | ICD-10-CM

## 2022-04-09 HISTORY — PX: IR KYPHO LUMBAR INC FX REDUCE BONE BX UNI/BIL CANNULATION INC/IMAGING: IMG5519

## 2022-04-09 MED ORDER — MIDAZOLAM HCL 2 MG/2ML IJ SOLN
1.0000 mg | INTRAMUSCULAR | Status: DC | PRN
  Administered 2022-04-09 (×2): 1 mg via INTRAVENOUS

## 2022-04-09 MED ORDER — FENTANYL CITRATE PF 50 MCG/ML IJ SOSY
25.0000 ug | PREFILLED_SYRINGE | INTRAMUSCULAR | Status: DC | PRN
  Administered 2022-04-09 (×2): 50 ug via INTRAVENOUS

## 2022-04-09 MED ORDER — SODIUM CHLORIDE 0.9 % IV SOLN: INTRAVENOUS | Status: DC

## 2022-04-09 MED ORDER — ACETAMINOPHEN 10 MG/ML IV SOLN: 1000.0000 mg | Freq: Once | INTRAVENOUS | Status: DC

## 2022-04-09 MED ORDER — CEFAZOLIN SODIUM-DEXTROSE 2-4 GM/100ML-% IV SOLN
2.0000 g | INTRAVENOUS | Status: AC
  Administered 2022-04-09: 2 g via INTRAVENOUS

## 2022-04-09 NOTE — Progress Notes (Signed)
Pt back in nursing recovery area. Pt still drowsy from procedure but will wake up when spoken to. Pt follows commands, talks in complete sentences and has no complaints at this time. Pt will remain in nursing station until discharge.  ?

## 2022-04-09 NOTE — Discharge Instructions (Signed)
Kyphoplasty Post Procedure Discharge Instructions  May resume a regular diet and any medications that you routinely take (including pain medications). However, if you are taking Aspirin or an anticoagulant/blood thinner you will be told when you can resume taking these by the healthcare provider. No driving day of procedure. The day of your procedure take it easy. You may use an ice pack as needed to injection sites on back.  Ice to back 30 minutes on and 30 minutes off, as needed. May remove bandaids tomorrow after taking a shower. Replace daily with a clean bandaid until healed.  Do not lift anything heavier than a milk jug for 1-2 weeks or determined by your physician.  Follow up with your physician in 2 weeks.    Please contact our office at [phone] for the following symptoms or if you have any questions:  Fever greater than 100 degrees Increased swelling, pain, or redness at injection site. Increased back and/or leg pain New numbness or change in symptoms from before the procedure.    Thank you for visiting [facility]. 

## 2022-04-17 NOTE — Progress Notes (Signed)
Phone call to pt to follow up from her kyphoplasty on 04/09/22. Pt reports her pain is completely gone post procedure. Pt reports she is able to move around a lot better. Pt denies any signs of infection, redness at the site, draining or fever. Pt has no complaints at this time and will be scheduled for a telephone follow up with Dr. Bryn Gulling next week. Pt advised to call back if anything were to change or any concerns arise and we will arrange an in person appointment. Pt verbalized understanding.

## 2022-04-18 ENCOUNTER — Encounter: Payer: Self-pay | Admitting: Family Medicine

## 2022-04-18 ENCOUNTER — Other Ambulatory Visit: Payer: Self-pay

## 2022-04-18 ENCOUNTER — Ambulatory Visit (INDEPENDENT_AMBULATORY_CARE_PROVIDER_SITE_OTHER): Payer: Medicare Other | Admitting: Family Medicine

## 2022-04-18 VITALS — BP 120/78 | HR 72 | Ht 68.0 in | Wt 148.0 lb

## 2022-04-18 DIAGNOSIS — I10 Essential (primary) hypertension: Secondary | ICD-10-CM | POA: Diagnosis not present

## 2022-04-18 DIAGNOSIS — M8000XD Age-related osteoporosis with current pathological fracture, unspecified site, subsequent encounter for fracture with routine healing: Secondary | ICD-10-CM

## 2022-04-18 DIAGNOSIS — F32A Anxiety disorder, unspecified: Secondary | ICD-10-CM

## 2022-04-18 DIAGNOSIS — F419 Anxiety disorder, unspecified: Secondary | ICD-10-CM | POA: Diagnosis not present

## 2022-04-18 DIAGNOSIS — E782 Mixed hyperlipidemia: Secondary | ICD-10-CM | POA: Diagnosis not present

## 2022-04-18 DIAGNOSIS — Z09 Encounter for follow-up examination after completed treatment for conditions other than malignant neoplasm: Secondary | ICD-10-CM

## 2022-04-18 DIAGNOSIS — Z1231 Encounter for screening mammogram for malignant neoplasm of breast: Secondary | ICD-10-CM | POA: Diagnosis not present

## 2022-04-18 MED ORDER — FLUOXETINE HCL 20 MG PO TABS: 20.0000 mg | ORAL_TABLET | Freq: Every day | ORAL | 1 refills | Status: DC

## 2022-04-18 MED ORDER — HYDROCHLOROTHIAZIDE 12.5 MG PO TABS: 12.5000 mg | ORAL_TABLET | Freq: Every day | ORAL | 1 refills | Status: DC

## 2022-04-18 MED ORDER — ATORVASTATIN CALCIUM 10 MG PO TABS: 10.0000 mg | ORAL_TABLET | Freq: Every day | ORAL | 1 refills | Status: DC

## 2022-04-18 NOTE — Progress Notes (Signed)
Date:  04/18/2022   Name:  Debbie Montgomery   DOB:  05/18/1946   MRN:  [id number]   Chief Complaint: Hyperlipidemia and Hypertension  Hyperlipidemia This is a chronic problem. The current episode started more than 1 year ago. The problem is controlled. Recent lipid tests were reviewed and are normal. She has no history of chronic renal disease, diabetes, hypothyroidism, liver disease, obesity or nephrotic syndrome. There are no known factors aggravating her hyperlipidemia. Pertinent negatives include no chest pain, leg pain, myalgias or shortness of breath. Current antihyperlipidemic treatment includes statins. The current treatment provides moderate improvement of lipids.  Hypertension This is a chronic problem. The current episode started more than 1 year ago. The problem has been gradually improving since onset. The problem is controlled. Pertinent negatives include no blurred vision, chest pain, headaches or shortness of breath. There are no associated agents to hypertension. Risk factors for coronary artery disease include dyslipidemia and diabetes mellitus. Past treatments include diuretics. The current treatment provides moderate improvement. There are no compliance problems.  There is no history of angina, kidney disease, CAD/MI, CVA, heart failure, left ventricular hypertrophy, PVD or retinopathy. There is no history of chronic renal disease, a hypertension causing med or renovascular disease.  Depression        This is a chronic problem.  The problem has been gradually improving since onset.  Associated symptoms include no decreased concentration, no fatigue, no helplessness, no hopelessness, does not have insomnia, not irritable, no restlessness, no decreased interest, no appetite change, no body aches, no myalgias, no headaches, no indigestion, not sad and no suicidal ideas.  Past treatments include SSRIs - Selective serotonin reuptake inhibitors.  Compliance with treatment is good.   Previous treatment provided moderate relief.   Pertinent negatives include no hypothyroidism.   Lab Results  Component Value Date   NA 140 04/02/2022   K 4.0 04/02/2022   CO2 26 04/02/2022   GLUCOSE 103 04/02/2022   BUN 21 04/02/2022   CREATININE 0.47 (L) 04/02/2022   CALCIUM 9.5 04/02/2022   EGFR 100 04/02/2022   GFRNONAA 89 12/17/2019   Lab Results  Component Value Date   CHOL 161 07/03/2021   HDL 71 07/03/2021   LDLCALC 76 07/03/2021   TRIG 76 07/03/2021   Lab Results  Component Value Date   TSH 1.25 06/25/2019   No results found for: "HGBA1C" Lab Results  Component Value Date   WBC 8.0 04/02/2022   HGB 13.5 04/02/2022   HCT 39.1 04/02/2022   MCV 95.4 04/02/2022   PLT 245 04/02/2022   Lab Results  Component Value Date   ALT 14 07/03/2021   AST 19 07/03/2021   ALKPHOS 39 (L) 07/03/2021   BILITOT 0.5 07/03/2021   No results found for: "25OHVITD2", "25OHVITD3", "VD25OH"   Review of Systems  Constitutional:  Negative for appetite change and fatigue.  Eyes:  Negative for blurred vision.  Respiratory:  Negative for shortness of breath.   Cardiovascular:  Negative for chest pain.  Musculoskeletal:  Negative for myalgias.  Neurological:  Negative for headaches.  Psychiatric/Behavioral:  Positive for depression. Negative for decreased concentration and suicidal ideas. The patient does not have insomnia.     Patient Active Problem List   Diagnosis Date Noted   Pelvic pressure in female 03/20/2022   Neck pain 10/03/2020   Anxiety, generalized 10/03/2020   Headache disorder 05/02/2020   Family history of melanoma 12/16/2019   Varicose veins of leg with pain, bilateral  11/02/2019   Aphthous ulcer of mouth 08/11/2019   High blood pressure 07/01/2008    No Known Allergies  Past Surgical History:  Procedure Laterality Date   ABDOMINAL HYSTERECTOMY     bladder lift  1995   COLONOSCOPY     EYE SURGERY     lasix   IR KYPHO LUMBAR INC FX REDUCE BONE BX  UNI/BIL CANNULATION INC/IMAGING  04/09/2022   IR RADIOLOGIST EVAL & MGMT  04/02/2022   LASER ABLATION Right    TONSILLECTOMY AND ADENOIDECTOMY     TUBAL LIGATION      Social History   Tobacco Use   Smoking status: Former   Smokeless tobacco: Never  Vaping Use   Vaping Use: Never used  Substance Use Topics   Alcohol use: Yes    Comment: ocassionally   Drug use: Never     Medication list has been reviewed and updated.  Current Meds  Medication Sig   atorvastatin (LIPITOR) 10 MG tablet Take 1 tablet (10 mg total) by mouth daily.   FLUoxetine (PROZAC) 20 MG tablet Take 1 tablet (20 mg total) by mouth daily.   hydrochlorothiazide (HYDRODIURIL) 12.5 MG tablet TAKE 1 TABLET BY MOUTH EVERY DAY   Lactobacillus (PROBIOTIC ACIDOPHILUS PO) Take by mouth.   METAMUCIL FIBER PO Take by mouth.   valACYclovir (VALTREX) 1000 MG tablet Take 1,000 mg by mouth daily. Dr Corley/ Derm       04/18/2022    9:50 AM 11/08/2021    9:03 AM 10/24/2020   10:08 AM 06/22/2020    1:18 PM  GAD 7 : Generalized Anxiety Score  Nervous, Anxious, on Edge 0 0 0 0  Control/stop worrying 0 0 0 0  Worry too much - different things 0 0 0 0  Trouble relaxing 0 0 0 0  Restless 0 0 0 0  Easily annoyed or irritable 0 0 0 0  Afraid - awful might happen 0 0 0 0  Total GAD 7 Score 0 0 0 0  Anxiety Difficulty Not difficult at all Not difficult at all         04/18/2022    9:49 AM 11/08/2021    9:03 AM 10/24/2020   10:08 AM  Depression screen PHQ 2/9  Decreased Interest 0 0 0  Down, Depressed, Hopeless 0 0 0  PHQ - 2 Score 0 0 0  Altered sleeping 0 0 0  Tired, decreased energy 0 1 0  Change in appetite 0 0 0  Feeling bad or failure about yourself  0 0 0  Trouble concentrating 0 0 0  Moving slowly or fidgety/restless 0 0 0  Suicidal thoughts 0 0 0  PHQ-9 Score 0 1 0  Difficult doing work/chores Not difficult at all Not difficult at all     BP Readings from Last 3 Encounters:  04/18/22 120/78  04/09/22 (!)  157/70  04/02/22 (!) 152/80    Physical Exam Vitals and nursing note reviewed. Exam conducted with a chaperone present.  Constitutional:      General: She is not irritable.She is not in acute distress.    Appearance: She is not diaphoretic.  HENT:     Head: Normocephalic and atraumatic.     Right Ear: External ear normal.     Left Ear: External ear normal.     Nose: Nose normal.  Eyes:     General:        Right eye: No discharge.        Left  eye: No discharge.     Conjunctiva/sclera: Conjunctivae normal.     Pupils: Pupils are equal, round, and reactive to light.  Neck:     Thyroid: No thyromegaly.     Vascular: No JVD.  Cardiovascular:     Rate and Rhythm: Normal rate and regular rhythm.     Heart sounds: Normal heart sounds, S1 normal and S2 normal. No murmur heard.    No systolic murmur is present.     No diastolic murmur is present.     No friction rub. No gallop. No S3 or S4 sounds.  Pulmonary:     Effort: Pulmonary effort is normal.     Breath sounds: Normal breath sounds. No decreased breath sounds, wheezing, rhonchi or rales.  Chest:  Breasts:    Right: No swelling, bleeding, inverted nipple, mass, nipple discharge, skin change or tenderness.     Left: No swelling, bleeding, inverted nipple, mass, nipple discharge, skin change or tenderness.  Abdominal:     General: Bowel sounds are normal.     Palpations: Abdomen is soft. There is no mass.     Tenderness: There is no abdominal tenderness. There is no guarding.  Musculoskeletal:        General: Normal range of motion.     Cervical back: Normal range of motion and neck supple.  Lymphadenopathy:     Cervical: No cervical adenopathy.  Skin:    General: Skin is warm and dry.  Neurological:     Mental Status: She is alert.     Wt Readings from Last 3 Encounters:  04/18/22 148 lb (67.1 kg)  04/02/22 149 lb (67.6 kg)  03/22/22 147 lb (66.7 kg)    BP 120/78 (BP Location: Right Arm, Cuff Size: Normal)   Pulse  72   Ht _0  (1.727 m)   Wt 148 lb (67.1 kg)   SpO2 98%   BMI 22.50 kg/m   Assessment and Plan: 1. Essential hypertension Chronic.  Controlled.  Stable.  Blood pressure 120/78.  Continue hydrochlorothiazide 12.5 mg once a day. - hydrochlorothiazide (HYDRODIURIL) 12.5 MG tablet; Take 1 tablet (12.5 mg total) by mouth daily.  Dispense: 90 tablet; Refill: 1  2. Mixed hyperlipidemia .  Controlled.  Stable.  Continue atorvastatin 10 mg once a day.  We will check lipid panel for current status of LDL. - atorvastatin (LIPITOR) 10 MG tablet; Take 1 tablet (10 mg total) by mouth daily.  Dispense: 90 tablet; Refill: 1 - Lipid Panel With LDL/HDL Ratio  3. Anxiety and depression .  Controlled.  Stable.  Continue fluoxetine 20 mg once a day.  Given upcoming changes that are coming in her daily schedule and it would probably be prudent that we stay on her medication at this time. - FLUoxetine (PROZAC) 20 MG tablet; Take 1 tablet (20 mg total) by mouth daily.  Dispense: 90 tablet; Refill: 1  4. Breast cancer screening by mammogram Breast exam was normal with no palpable mass we will schedule for mammogram screening. - MM 3D SCREEN BREAST BILATERAL  5. Age-related osteoporosis with current pathological fracture with routine healing, subsequent encounter Patient had her recent surgery had noted that there was some softness to her bones.  On review she had DEXA scan in 2018 that was decreased below -2.5 and became osteoporosis.  I am not certain why patient was not on Fosamax however at this point in time we will repeat DEXA scan and will likely initiate Fosamax 70 mg once a week.  Otilio Miu, MD

## 2022-04-19 ENCOUNTER — Other Ambulatory Visit: Payer: Self-pay | Admitting: Interventional Radiology

## 2022-04-19 ENCOUNTER — Ambulatory Visit
Admission: RE | Admit: 2022-04-19 | Discharge: 2022-04-19 | Disposition: A | Discharge status: Home / Self Care | Payer: Medicare Other | Source: Ambulatory Visit | Attending: Interventional Radiology | Admitting: Interventional Radiology

## 2022-04-19 DIAGNOSIS — S32010S Wedge compression fracture of first lumbar vertebra, sequela: Secondary | ICD-10-CM

## 2022-04-19 HISTORY — PX: IR RADIOLOGIST EVAL & MGMT: IMG5224

## 2022-04-19 LAB — LIPID PANEL WITH LDL/HDL RATIO
Cholesterol, Total: 154 mg/dL (ref 100–199)
HDL: 79 mg/dL (ref >39)
LDL Chol Calc (NIH): 65 mg/dL (ref 0–99)
LDL/HDL Ratio: 0.8 ratio (ref 0.0–3.2)
Triglycerides: 47 mg/dL (ref 0–149)
VLDL Cholesterol Cal: 10 mg/dL (ref 5–40)

## 2022-04-19 NOTE — Progress Notes (Signed)
Chief Complaint: L1 compression fracture  Referring Physician(s): Drake Leach, PA-C   History of Present Illness: Debbie Montgomery is a 76 y.o. 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  She failed conservative management and was treated with Kyphoplasty on 04/09/2022.  I spoke with her on the phone today.  She states that her pain is nearly completely resolved.  She does not report any new sites of pain or numbness.  Past Medical History:  Diagnosis Date   Hypertension     Past Surgical History:  Procedure Laterality Date   ABDOMINAL HYSTERECTOMY     bladder lift  1995   COLONOSCOPY     EYE SURGERY     lasix   IR KYPHO LUMBAR INC FX REDUCE BONE BX UNI/BIL CANNULATION INC/IMAGING  04/09/2022   IR RADIOLOGIST EVAL & MGMT  04/02/2022   LASER ABLATION Right    TONSILLECTOMY AND ADENOIDECTOMY     TUBAL LIGATION      Allergies: Patient has no known allergies.  Medications: Prior to Admission medications   Medication Sig Start Date End Date Taking? Authorizing Provider  atorvastatin (LIPITOR) 10 MG tablet Take 1 tablet (10 mg total) by mouth daily. 04/18/22   Duanne Limerick, MD  FLUoxetine (PROZAC) 20 MG tablet Take 1 tablet (20 mg total) by mouth daily. 04/18/22   Duanne Limerick, MD  hydrochlorothiazide (HYDRODIURIL) 12.5 MG tablet Take 1 tablet (12.5 mg total) by mouth daily. 04/18/22   Duanne Limerick, MD  Lactobacillus (PROBIOTIC ACIDOPHILUS PO) Take by mouth.    [provider]  METAMUCIL FIBER PO Take by mouth.    [provider]  valACYclovir (VALTREX) 1000 MG tablet Take 1,000 mg by mouth daily. Dr Corley/ Derm 03/21/20   [provider]     Family History  Problem Relation Age of Onset   Hypertension Mother    Heart disease Father    Heart attack Maternal Grandmother    Stroke Maternal Grandmother     Social History   Socioeconomic History   Marital status:  Married    Spouse name: Not on file   Number of children: Not on file   Years of education: Not on file   Highest education level: Not on file  Occupational History   Not on file  Tobacco Use   Smoking status: Former   Smokeless tobacco: Never  Vaping Use   Vaping Use: Never used  Substance and Sexual Activity   Alcohol use: Yes    Comment: ocassionally   Drug use: Never   Sexual activity: Not Currently  Other Topics Concern   Not on file  Social History Narrative   Not on file   Social Determinants of Health   Financial Resource Strain: Low Risk  (03/20/2020)   Overall Financial Resource Strain (CARDIA)    Difficulty of Paying Living Expenses: Not hard at all  Food Insecurity: No Food Insecurity (03/20/2020)   Hunger Vital Sign    Worried About Running Out of Food in the Last Year: Never true    Ran Out of Food in the Last Year: Never true  Transportation Needs: No Transportation Needs (03/20/2020)   PRAPARE - Administrator, Civil Service (Medical): No    Lack of Transportation (Non-Medical): No  Physical Activity: Sufficiently Active (03/20/2020)   Exercise Vital Sign    Days of Exercise per Week: 3 days    Minutes of Exercise  per Session: 60 min  Stress: No Stress Concern Present (03/20/2020)   [employer] of Occupational Health - Occupational Stress Questionnaire    Feeling of Stress : Not at all  Social Connections: Unknown (03/20/2020)   Social Connection and Isolation Panel [NHANES]    Frequency of Communication with Friends and Family: Not on file    Frequency of Social Gatherings with Friends and Family: Not on file    Attends Religious Services: Not on file    Active Member of Clubs or Organizations: Not on file    Attends Banker Meetings: Not on file    Marital Status: Married   Review of Systems  Review of Systems: A 12 point ROS discussed and pertinent positives are indicated in the HPI above.  All other systems are  negative.  Advance Care Plan: The advanced care plan/surrogate decision maker was discussed at the time of visit and the patient did not wish to discuss or was not able to name a surrogate decision maker or provide an advance care plan.   Physical Exam No direct physical exam was performed (except for noted visual exam findings with Video Visits).   Vital Signs: There were no vitals taken for this visit.  Imaging: IR KYPHO LUMBAR INC FX REDUCE BONE BX UNI/BIL CANNULATION INC/IMAGING  Result Date: 04/09/2022 INDICATION: 76 year old woman with traumatic L1 compression fracture and intractable back pain presents to IR for kyphoplasty. EXAM: L1 kyphoplasty COMPARISON:  MRI lumbar spine 03/26/2022 MEDICATIONS: As antibiotic prophylaxis, Ancef 2 g IV was ordered pre-procedure and administered intravenously within 1 hour of incision. All current medications are in the EMR and have been reviewed as part of this encounter. ANESTHESIA/SEDATION: Moderate (conscious) sedation was employed during this procedure. A total of Versed 2 mg and Fentanyl 100 mcg was administered intravenously by the radiology nurse. Total intra-service moderate Sedation Time: 34 minutes. [name] level of consciousness and vital signs were monitored continuously by radiology nursing throughout the procedure under my direct supervision. FLUOROSCOPY: Radiation Exposure Index (as provided by the fluoroscopic device): 42.4 mGy Kerma COMPLICATIONS: None immediate. PROCEDURE: Following a full explanation of the procedure along with the potential associated complications, an informed witnessed consent was obtained. Patient positioned prone on the angiography table. Mid back skin prepped and draped in usual sterile fashion. All elements of maximal sterile barrier were utilized including, cap, mask, sterile gown, sterile gloves, large sterile drape, hand scrubbing and 2% Chlorhexidine for skin cleaning. The L1 vertebral body was localized  under fluoroscopy. Intermittent fluoroscopy was used for guidance throughout the procedure. Multiple spot films of the lumbar spine were also obtained in the AP and lateral projections during the procedure. Right transpedicular cannulation of the vertebral body was performed with 10 ga Osteo-introducer needle. Fluoroscopy showed satisfactory positioning of equipment within the mildly compressed vertebral body with no displaced fracture fragments. Partial fracture reduction and bony cavity creation was then performed using an inflatable bone tamp. The bone tamp was removed, and vertebral augmentation was performed to careful application of 3 mL of firm consistency bone cement under lateral fluoroscopic guidance. Given symmetric vertebral body fill, contralateral transpedicular approach was not performed. The cannula was removed and hemostasis achieved with manual compression. IMPRESSION: L1 kyphoplasty as above If the patient has known osteoporosis, recommend treatment as clinically indicated. If the patient's bone density status is unknown, DEXA scan is recommended. Electronically Signed   By: Acquanetta Belling M.D.   On: 04/09/2022 14:36   IR Radiologist Eval &  Mgmt  Result Date: 04/02/2022 EXAM: NEW PATIENT OFFICE VISIT CHIEF COMPLAINT: As documented in the EMR HISTORY OF PRESENT ILLNESS: The patient reports that she fell backwards while playing pickleball on February 01, 2022. Patient was evaluated in the emergency room where radiographs of her spine demonstrated acute compression fracture of the L1 vertebral body. The patient was discharged and eventually had follow-up with a spine specialist, who then prescribed a back brace and narcotic pain medication for the patient. The patient has been using the back brace consistently for the last 5 weeks. Patient reports that her pain is worse throughout the day, getting to 9/10 severity in the evenings. With rest and prescription pain medication (tramadol, hydrocodone),  the pain does improved to 4 over 10. She describes severe disability on the Roland Morris disability questionnaire with 19/24 positive. Lumbar spine radiograph on February 01, 2022 described 30% height loss of the L1 vertebral body. Follow-up radiograph on October 18th 2023 demonstrated progressive 60% height loss of the L1 vertebral body. MRI lumbar spine on March 26, 2022 demonstrated subacute fracture of the L1 vertebral body with ongoing edema and inflammation. REVIEW OF SYSTEMS: As documented in the EMR PHYSICAL EXAMINATION: As documented in the EMR ASSESSMENT AND PLAN: As documented in the EMR Electronically Signed   By: Olive Bass M.D.   On: 04/02/2022 14:44   MR LUMBAR SPINE WO CONTRAST  Result Date: 03/27/2022 CLINICAL DATA:  Compression fracture. EXAM: MRI LUMBAR SPINE WITHOUT CONTRAST TECHNIQUE: Multiplanar, multisequence MR imaging of the lumbar spine was performed. No intravenous contrast was administered. COMPARISON:  01/12/21 L spine xray,  L spine MRI, 02/01/22 FINDINGS: Segmentation: There is transitional anatomy. In keeping with prior numbering convention the compression deformity is localized to the L1 vertebral body. There is lumbarization of S1 with the last well-formed disc space at S1-S2. Alignment:  Trace anterolisthesis of L4 on L5. Vertebrae: Redemonstrated is a compression deformity at the L1 vertebral body level which demonstrates T2/stir hyperintense signal abnormality, which is suggestive of vertebral body edema, which can be seen in the setting of an acute process. There is mild posterior bulging of the inferior endplate without significant spinal canal stenosis. There is no evidence of an epidural hematoma. Conus medullaris and cauda equina: Conus extends to the L1 level. Conus and cauda equina appear normal. Paraspinal and other soft tissues: There is a T2 hyperintense lesion of the inferior pole of the right kidney, most likely a small renal cyst which does not require  further workup. Disc levels: T11-T12: Only imaged in the sagittal plane. No evidence of spinal canal or neural foraminal stenosis. T12-L1: No disc bulge. No significant facet degenerative change. No spinal canal stenosis. No neural foraminal stenosis. L1-L2: Mild bilateral facet degenerative change. No spinal canal stenosis. No neural foraminal stenosis. L2-L3: Mild bilateral facet degenerative change. Mild ligamentum flavum hypertrophy. No spinal canal stenosis. No neural foraminal stenosis. L3-L4: Mild bilateral facet degenerative change. Minimal disc bulge. No spinal canal stenosis. No neural foraminal stenosis. L4-L5: Moderate bilateral facet degenerative change. Ligamentum flavum hypertrophy. Right paracentral disc protrusion. There is mild narrowing of the lateral right lateral recess. No overall spinal canal stenosis. Is mild right neural foraminal stenosis. L5-S1: Severe bilateral facet degenerative change. Grade 1 anterolisthesis. Minimal disc bulge. No spinal canal stenosis. Mild left neural foraminal stenosis. S1-S2: Mild bilateral facet degenerative change. No spinal canal stenosis. No neural foraminal stenosis. IMPRESSION: 1. In keeping with prior numbering convention, the compression deformity is localized to the L1 vertebral  body. There is vertebral body edema associated with the L1 vertebral body, which can be seen in the setting of an acute on chronic process. There is mild posterior bulging of the inferior endplate without significant spinal canal stenosis. No evidence of an epidural hematoma. 2. Multilevel degenerative changes as above without high-grade spinal canal or neural foraminal stenosis. Electronically Signed   By: Lorenza CambridgeHemant  Desai M.D.   On: 03/27/2022 11:09    Labs:  CBC: Recent Labs    05/10/21 1100 04/02/22 1335  WBC 7.0 8.0  HGB 13.4 13.5  HCT 40.0 39.1  PLT 238 245    COAGS: No results for input(s): "INR", "APTT" in the last 8760 hours.  BMP: Recent Labs     05/10/21 1100 04/02/22 1335  NA 142 140  K 4.4 4.0  CL 102 107  CO2 25 26  GLUCOSE 94 103  BUN 15 21  CALCIUM 9.4 9.5  CREATININE 0.63 0.47*    LIVER FUNCTION TESTS: Recent Labs    05/10/21 1100 07/03/21 1031  BILITOT 0.3 0.5  AST 17 19  ALT 9 14  ALKPHOS 35* 39*  PROT 6.7 6.9  ALBUMIN 4.9* 4.7    TUMOR MARKERS: No results for input(s): "AFPTM", "CEA", "CA199", "CHROMGRNA" in the last 8760 hours.  Assessment and Plan:  76 year old woman with osteoporotic and traumatic fracture of L1, treated with Kyphoplasty after failure of conservative management.  Her pain has significantly improve following the procedure.  We again discussed how important it is for her to be treated for Osteoporosis in order to minimize her risk of future fractures.  She is following up with her PCP regarding treatment for Osteoporosis.  Thank you for this interesting consult.  I greatly enjoyed meeting Debbie Montgomery and look forward to participating in their care.  A copy of this report was sent to the requesting provider on this date.  Electronically Signed: Al CorpusFarhaan R Jeb Schloemer 04/19/2022, 4:10 PM   I spent a total of   15 Minutes in remote  clinical consultation, greater than 50% of which was counseling/coordinating care for L1 compression fracture.    Visit type: Audio only (telephone). Audio (no video) only due to video not being required from this appointment. [facility], [address], [place], [place].  This format is felt to be most appropriate for this patient at this time.  All issues noted in this document were discussed and addressed.

## 2022-04-22 NOTE — Telephone Encounter (Signed)
Okay to give her a work note through 11/30. Let her know I'm so glad she is doing better!

## 2022-04-22 NOTE — Telephone Encounter (Signed)
Patient calling to make sure Debbie Montgomery did get her message. She is seeing Dr.Chasnis on 05/02/2022. Can she get a work note through 05/02/2022?

## 2022-05-10 ENCOUNTER — Ambulatory Visit: Payer: Medicare Other | Admitting: Family Medicine

## 2022-05-16 ENCOUNTER — Other Ambulatory Visit: Payer: 59

## 2022-05-16 ENCOUNTER — Ambulatory Visit: Payer: 59

## 2022-05-23 ENCOUNTER — Ambulatory Visit
Admission: RE | Admit: 2022-05-23 | Discharge: 2022-05-23 | Disposition: A | Discharge status: Home / Self Care | Payer: Medicare Other | Source: Ambulatory Visit | Attending: Family Medicine | Admitting: Family Medicine

## 2022-05-23 DIAGNOSIS — M8000XD Age-related osteoporosis with current pathological fracture, unspecified site, subsequent encounter for fracture with routine healing: Secondary | ICD-10-CM | POA: Insufficient documentation

## 2022-05-23 DIAGNOSIS — Z09 Encounter for follow-up examination after completed treatment for conditions other than malignant neoplasm: Secondary | ICD-10-CM | POA: Insufficient documentation

## 2022-05-23 DIAGNOSIS — Z1231 Encounter for screening mammogram for malignant neoplasm of breast: Secondary | ICD-10-CM | POA: Diagnosis present

## 2022-05-28 ENCOUNTER — Other Ambulatory Visit: Payer: Self-pay

## 2022-05-28 DIAGNOSIS — M816 Localized osteoporosis [Lequesne]: Secondary | ICD-10-CM

## 2022-05-28 MED ORDER — ALENDRONATE SODIUM 70 MG PO TABS: 70.0000 mg | ORAL_TABLET | ORAL | 0 refills | Status: DC

## 2022-06-01 ENCOUNTER — Other Ambulatory Visit: Payer: Self-pay | Admitting: Family Medicine

## 2022-06-01 DIAGNOSIS — I10 Essential (primary) hypertension: Secondary | ICD-10-CM

## 2022-06-02 NOTE — Telephone Encounter (Signed)
Dc'd 11/08/21 Debbie Montgomery "Duplicate"   Requested Prescriptions  Refused Prescriptions Disp Refills   hydrochlorothiazide (MICROZIDE) 12.5 MG capsule [Pharmacy Med Name: HYDROCHLOROTHIAZIDE 12.5 MG CP] 90 capsule 1    Sig: TAKE 1 CAPSULE BY MOUTH EVERY DAY     Cardiovascular: Diuretics - Thiazide Failed - 06/01/2022 12:38 PM      Failed - Cr in normal range and within 180 days    Creat  Date Value Ref Range Status  04/02/2022 0.47 (L) 0.60 - 1.00 mg/dL Final         Passed - K in normal range and within 180 days    Potassium  Date Value Ref Range Status  04/02/2022 4.0 3.5 - 5.3 mmol/L Final         Passed - Na in normal range and within 180 days    Sodium  Date Value Ref Range Status  04/02/2022 140 135 - 146 mmol/L Final  05/10/2021 142 134 - 144 mmol/L Final         Passed - Last BP in normal range    BP Readings from Last 1 Encounters:  04/18/22 120/78         Passed - Valid encounter within last 6 months    Recent Outpatient Visits           1 month ago Essential hypertension   [facility]   3 months ago Compression fracture of L1 vertebra with routine healing, subsequent encounter   [place] [facility]   6 months ago Essential hypertension   [place] [facility]   1 year ago Essential hypertension   [place] [facility]   1 year ago Essential hypertension   [place] [facility]       Future Appointments             In 2 months Debbie Limerick, MD [facility] at [facility]   In 4 months Debbie Limerick, MD [facility] at [facility]

## 2022-07-09 ENCOUNTER — Ambulatory Visit (INDEPENDENT_AMBULATORY_CARE_PROVIDER_SITE_OTHER): Payer: Medicare Other | Admitting: Family Medicine

## 2022-07-09 DIAGNOSIS — M816 Localized osteoporosis [Lequesne]: Secondary | ICD-10-CM | POA: Diagnosis not present

## 2022-07-09 NOTE — Progress Notes (Signed)
Date:  07/09/2022   Name:  Axel Meas [facility]   DOB:  05/14/1946   MRN:  [id number]   Chief Complaint: surgery clearance  Patient is a 77 year old female who presents for a preoperative assestment exam. The patient reports the following problems: none/osteoporosis. Health maintenance has been reviewed up to date.      Lab Results  Component Value Date   NA 140 04/02/2022   K 4.0 04/02/2022   CO2 26 04/02/2022   GLUCOSE 103 04/02/2022   BUN 21 04/02/2022   CREATININE 0.47 (L) 04/02/2022   CALCIUM 9.5 04/02/2022   EGFR 100 04/02/2022   GFRNONAA 89 12/17/2019   Lab Results  Component Value Date   CHOL 154 04/18/2022   HDL 79 04/18/2022   LDLCALC 65 04/18/2022   TRIG 47 04/18/2022   Lab Results  Component Value Date   TSH 1.25 06/25/2019   No results found for: "HGBA1C" Lab Results  Component Value Date   WBC 8.0 04/02/2022   HGB 13.5 04/02/2022   HCT 39.1 04/02/2022   MCV 95.4 04/02/2022   PLT 245 04/02/2022   Lab Results  Component Value Date   ALT 14 07/03/2021   AST 19 07/03/2021   ALKPHOS 39 (L) 07/03/2021   BILITOT 0.5 07/03/2021   No results found for: "25OHVITD2", "25OHVITD3", "VD25OH"   Review of Systems  Constitutional: Negative.  Negative for chills, fatigue, fever and unexpected weight change.  HENT:  Negative for congestion, ear discharge, ear pain, rhinorrhea, sinus pressure, sneezing and sore throat.   Respiratory:  Negative for cough, shortness of breath, wheezing and stridor.   Gastrointestinal:  Negative for abdominal pain, blood in stool, constipation, diarrhea and nausea.  Genitourinary:  Negative for dysuria, flank pain, frequency, hematuria, urgency and vaginal discharge.  Musculoskeletal:  Negative for arthralgias, back pain and myalgias.  Skin:  Negative for rash.  Neurological:  Negative for dizziness, weakness and headaches.  Hematological:  Negative for adenopathy. Does not bruise/bleed easily.  Psychiatric/Behavioral:   Negative for dysphoric mood. The patient is not nervous/anxious.     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   Aphthous ulcer of mouth 08/11/2019   High blood pressure 07/01/2008    No Known Allergies  Past Surgical History:  Procedure Laterality Date   ABDOMINAL HYSTERECTOMY     bladder lift  1995   COLONOSCOPY     EYE SURGERY     lasix   IR KYPHO LUMBAR INC FX REDUCE BONE BX UNI/BIL CANNULATION INC/IMAGING  04/09/2022   IR RADIOLOGIST EVAL & MGMT  04/02/2022   IR RADIOLOGIST EVAL & MGMT  04/19/2022   LASER ABLATION Right    TONSILLECTOMY AND ADENOIDECTOMY     TUBAL LIGATION      Social History   Tobacco Use   Smoking status: Former   Smokeless tobacco: Never  Scientific laboratory technician Use: Never used  Substance Use Topics   Alcohol use: Yes    Comment: ocassionally   Drug use: Never     Medication list has been reviewed and updated.  Current Meds  Medication Sig   alendronate (FOSAMAX) 70 MG tablet Take 1 tablet (70 mg total) by mouth every 7 (seven) days. Take with a  full glass of water on an empty stomach.   atorvastatin (LIPITOR) 10 MG tablet Take 1 tablet (10 mg total) by mouth daily.   FLUoxetine (PROZAC) 20 MG tablet Take 1 tablet (20 mg total) by mouth daily.   gabapentin (NEURONTIN) 100 MG capsule Take 100-200 mg by mouth at bedtime.   hydrochlorothiazide (HYDRODIURIL) 12.5 MG tablet Take 1 tablet (12.5 mg total) by mouth daily.   HYDROcodone-acetaminophen (NORCO/VICODIN) 5-325 MG tablet Take by mouth.   Lactobacillus (PROBIOTIC ACIDOPHILUS PO) Take by mouth.   METAMUCIL FIBER PO Take by mouth.   valACYclovir (VALTREX) 1000 MG tablet Take 1,000 mg by mouth daily. Dr Corley/ Derm       07/09/2022    3:00 PM 04/18/2022     9:50 AM 11/08/2021    9:03 AM 10/24/2020   10:08 AM  GAD 7 : Generalized Anxiety Score  Nervous, Anxious, on Edge 0 0 0 0  Control/stop worrying 0 0 0 0  Worry too much - different things 0 0 0 0  Trouble relaxing 0 0 0 0  Restless 0 0 0 0  Easily annoyed or irritable 0 0 0 0  Afraid - awful might happen 0 0 0 0  Total GAD 7 Score 0 0 0 0  Anxiety Difficulty Not difficult at all Not difficult at all Not difficult at all        07/09/2022    2:59 PM 04/18/2022    9:49 AM 11/08/2021    9:03 AM  Depression screen PHQ 2/9  Decreased Interest 0 0 0  Down, Depressed, Hopeless 0 0 0  PHQ - 2 Score 0 0 0  Altered sleeping 0 0 0  Tired, decreased energy 0 0 1  Change in appetite 0 0 0  Feeling bad or failure about yourself  0 0 0  Trouble concentrating 0 0 0  Moving slowly or fidgety/restless 0 0 0  Suicidal thoughts 0 0 0  PHQ-9 Score 0 0 1  Difficult doing work/chores Not difficult at all Not difficult at all Not difficult at all    BP Readings from Last 3 Encounters:  07/09/22 120/78  04/18/22 120/78  04/09/22 (!) 157/70    Physical Exam Vitals and nursing note reviewed.  Constitutional:      Appearance: She is well-developed.  HENT:     Head: Normocephalic.     Right Ear: External ear normal.     Left Ear: External ear normal.     Nose: No congestion or rhinorrhea.     Mouth/Throat:     Mouth: Mucous membranes are moist.     Pharynx: No oropharyngeal exudate or posterior oropharyngeal erythema.  Eyes:     General: Lids are everted, no foreign bodies appreciated. No scleral icterus.       Left eye: No foreign body or hordeolum.     Conjunctiva/sclera: Conjunctivae normal.     Right eye: Right conjunctiva is not injected.     Left eye: Left conjunctiva is not injected.     Pupils: Pupils are equal, round, and reactive to light.  Neck:     Thyroid: No thyromegaly.     Vascular: No carotid bruit or JVD.     Trachea: No tracheal deviation.  Cardiovascular:     Rate and  Rhythm: Normal rate and regular rhythm.     Heart sounds: Normal heart sounds. No murmur heard.    No friction rub. No gallop.  Pulmonary:     Effort: Pulmonary  effort is normal. No respiratory distress.     Breath sounds: Normal breath sounds. No wheezing, rhonchi or rales.  Abdominal:     General: Bowel sounds are normal.     Palpations: Abdomen is soft. There is no hepatomegaly, splenomegaly or mass.     Tenderness: There is no abdominal tenderness. There is no guarding or rebound.  Musculoskeletal:        General: No tenderness. Normal range of motion.     Cervical back: Normal range of motion and neck supple. No tenderness.  Lymphadenopathy:     Cervical: No cervical adenopathy.  Skin:    General: Skin is warm.     Findings: No bruising, erythema or rash.  Neurological:     Mental Status: She is alert and oriented to person, place, and time.     Cranial Nerves: No cranial nerve deficit.     Deep Tendon Reflexes: Reflexes normal.  Psychiatric:        Mood and Affect: Mood is not anxious or depressed.     Wt Readings from Last 3 Encounters:  07/09/22 146 lb (66.2 kg)  04/18/22 148 lb (67.1 kg)  04/02/22 149 lb (67.6 kg)    BP 120/78   Pulse 70   Ht 5\' 8"  (1.727 m)   Wt 146 lb (66.2 kg)   SpO2 95%   BMI 22.20 kg/m   Assessment and Plan:  1. Localized osteoporosis without current pathological fracture Patient has upcoming surgery for hip replacement and recently has been started on Fosamax 70 mg once a week and she is about to take her fifth dosing.  DEXA scan was noted to be -2.8 and patient has been instructed to call her surgeon and relayed to them that there is been a recent diagnostic evaluation for osteoporosis and treatments only been started 4 weeks ago.   Otilio Miu, MD

## 2022-07-25 ENCOUNTER — Other Ambulatory Visit: Payer: Self-pay | Admitting: Orthopedic Surgery

## 2022-07-31 ENCOUNTER — Encounter: Payer: Self-pay | Admitting: Urgent Care

## 2022-07-31 ENCOUNTER — Telehealth: Payer: Self-pay | Admitting: Urgent Care

## 2022-07-31 ENCOUNTER — Encounter
Admission: RE | Admit: 2022-07-31 | Discharge: 2022-07-31 | Disposition: A | Discharge status: Home / Self Care | Payer: Medicare Other | Source: Ambulatory Visit | Attending: Orthopedic Surgery | Admitting: Orthopedic Surgery

## 2022-07-31 ENCOUNTER — Other Ambulatory Visit: Payer: Self-pay

## 2022-07-31 DIAGNOSIS — Z01818 Encounter for other preprocedural examination: Secondary | ICD-10-CM | POA: Insufficient documentation

## 2022-07-31 DIAGNOSIS — Z01812 Encounter for preprocedural laboratory examination: Secondary | ICD-10-CM

## 2022-07-31 DIAGNOSIS — I447 Left bundle-branch block, unspecified: Secondary | ICD-10-CM | POA: Insufficient documentation

## 2022-07-31 DIAGNOSIS — Z01810 Encounter for preprocedural cardiovascular examination: Secondary | ICD-10-CM

## 2022-07-31 DIAGNOSIS — R8290 Unspecified abnormal findings in urine: Secondary | ICD-10-CM

## 2022-07-31 DIAGNOSIS — R9431 Abnormal electrocardiogram [ECG] [EKG]: Secondary | ICD-10-CM

## 2022-07-31 DIAGNOSIS — R0789 Other chest pain: Secondary | ICD-10-CM

## 2022-07-31 HISTORY — DX: Left bundle-branch block, unspecified: I44.7

## 2022-07-31 HISTORY — DX: Other intervertebral disc degeneration, lumbar region without mention of lumbar back pain or lower extremity pain: M51.369

## 2022-07-31 HISTORY — DX: Hyperlipidemia, unspecified: E78.5

## 2022-07-31 HISTORY — DX: Unspecified osteoarthritis, unspecified site: M19.90

## 2022-07-31 HISTORY — DX: Other intervertebral disc degeneration, lumbar region: M51.36

## 2022-07-31 LAB — TYPE AND SCREEN
ABO/RH(D): O POS
Antibody Screen: NEGATIVE

## 2022-07-31 LAB — COMPREHENSIVE METABOLIC PANEL
ALT: 12 U/L (ref 0–44)
AST: 17 U/L (ref 15–41)
Albumin: 4.3 g/dL (ref 3.5–5.0)
Alkaline Phosphatase: 28 U/L — ABNORMAL LOW (ref 38–126)
Anion gap: 8 (ref 5–15)
BUN: 20 mg/dL (ref 8–23)
CO2: 26 mmol/L (ref 22–32)
Calcium: 9.3 mg/dL (ref 8.9–10.3)
Chloride: 102 mmol/L (ref 98–111)
Creatinine, Ser: 0.54 mg/dL (ref 0.44–1.00)
GFR, Estimated: >60 mL/min (ref >60)
Glucose, Bld: 95 mg/dL (ref 70–99)
Potassium: 3.4 mmol/L — ABNORMAL LOW (ref 3.5–5.1)
Sodium: 136 mmol/L (ref 135–145)
Total Bilirubin: 0.5 mg/dL (ref 0.3–1.2)
Total Protein: 7 g/dL (ref 6.5–8.1)

## 2022-07-31 LAB — CBC WITH DIFFERENTIAL/PLATELET
Abs Immature Granulocytes: 0.01 10*3/uL (ref 0.00–0.07)
Basophils Absolute: 0.1 10*3/uL (ref 0.0–0.1)
Basophils Relative: 1 %
Eosinophils Absolute: 0.4 10*3/uL (ref 0.0–0.5)
Eosinophils Relative: 6 %
HCT: 39.5 % (ref 36.0–46.0)
Hemoglobin: 13.4 g/dL (ref 12.0–15.0)
Immature Granulocytes: 0 %
Lymphocytes Relative: 18 %
Lymphs Abs: 1.4 10*3/uL (ref 0.7–4.0)
MCH: 31.8 pg (ref 26.0–34.0)
MCHC: 33.9 g/dL (ref 30.0–36.0)
MCV: 93.6 fL (ref 80.0–100.0)
Monocytes Absolute: 0.5 10*3/uL (ref 0.1–1.0)
Monocytes Relative: 6 %
Neutro Abs: 5.3 10*3/uL (ref 1.7–7.7)
Neutrophils Relative %: 69 %
Platelets: 233 10*3/uL (ref 150–400)
RBC: 4.22 MIL/uL (ref 3.87–5.11)
RDW: 11.9 % (ref 11.5–15.5)
WBC: 7.7 10*3/uL (ref 4.0–10.5)
nRBC: 0 % (ref 0.0–0.2)

## 2022-07-31 LAB — SURGICAL PCR SCREEN
MRSA, PCR: NEGATIVE
Staphylococcus aureus: NEGATIVE

## 2022-07-31 LAB — URINALYSIS, ROUTINE W REFLEX MICROSCOPIC
Bilirubin Urine: NEGATIVE
Glucose, UA: NEGATIVE mg/dL
Hgb urine dipstick: NEGATIVE
Ketones, ur: NEGATIVE mg/dL
Nitrite: POSITIVE — AB
Protein, ur: NEGATIVE mg/dL
Specific Gravity, Urine: 1.021 (ref 1.005–1.030)
pH: 5 (ref 5.0–8.0)

## 2022-07-31 NOTE — Patient Instructions (Addendum)
Your procedure is scheduled on: Monday August 12, 2022 Report to the Registration Desk on the 1st floor of the [facility]. To find out your arrival time, please call [phone] between 1PM - 3PM on: Friday August 09, 2022. If your arrival time is 6:00 am, do not arrive before that time as the [facility] entrance doors do not open until 6:00 am.  REMEMBER: Instructions that are not followed completely may result in serious medical risk, up to and including death; or upon the discretion of your surgeon and anesthesiologist your surgery may need to be rescheduled.  Do not eat food after midnight the night before surgery.  No gum chewing or hard candies.  You may however, drink CLEAR liquids up to 2 hours before you are scheduled to arrive for your surgery. Do not drink anything within 2 hours of your scheduled arrival time.  Clear liquids include: - water  - apple juice without pulp - gatorade (not RED colors) - black coffee or tea (Do NOT add milk or creamers to the coffee or tea) Do NOT drink anything that is not on this list.  In addition, your doctor has ordered for you to drink the provided:  Ensure Pre-Surgery Clear Carbohydrate Drink  Drinking this carbohydrate drink up to two hours before surgery helps to reduce insulin resistance and improve patient outcomes. Please complete drinking 2 hours before scheduled arrival time.  One week prior to surgery: Stop Anti-inflammatories (NSAIDS) such as Advil, Aleve, Ibuprofen, Motrin, Naproxen, Naprosyn and Aspirin based products such as Excedrin, Goody's Powder, BC Powder. Stop ALL OVER THE COUNTER supplements until after surgery. (August 05, 2022 will be the last day to you vitamins) You may however, continue to take Tylenol if needed for pain up until the day of surgery.  Continue taking all prescribed medications with the exception of the following:  Follow recommendations from Cardiologist or PCP regarding stopping blood  thinners.  TAKE ONLY THESE MEDICATIONS THE MORNING OF SURGERY WITH A SIP OF WATER:  atorvastatin (LIPITOR) 10 MG  FLUoxetine (PROZAC) 20 MG    No Alcohol for 24 hours before or after surgery.  No Smoking including e-cigarettes for 24 hours before surgery.  No chewable tobacco products for at least 6 hours before surgery.  No nicotine patches on the day of surgery.  Do not use any "recreational" drugs for at least a week (preferably 2 weeks) before your surgery.  Please be advised that the combination of cocaine and anesthesia may have negative outcomes, up to and including death. If you test positive for cocaine, your surgery will be cancelled.  On the morning of surgery brush your teeth with toothpaste and water, you may rinse your mouth with mouthwash if you wish. Do not swallow any toothpaste or mouthwash.  Use CHG Soap or wipes as directed on instruction sheet.  Do not wear jewelry, make-up, hairpins, clips or nail polish.  Do not wear lotions, powders, or perfumes.   Do not shave body hair from the neck down 48 hours before surgery.  Contact lenses, hearing aids and dentures may not be worn into surgery.  Do not bring valuables to the hospital. [facility] is not responsible for any missing/lost belongings or valuables.   Notify your doctor if there is any change in your medical condition (cold, fever, infection).  Wear comfortable clothing (specific to your surgery type) to the hospital.  After surgery, you can help prevent lung complications by doing breathing exercises.  Take deep breaths  and cough every 1-2 hours. Your doctor may order a device called an Incentive Spirometer to help you take deep breaths. When coughing or sneezing, hold a pillow firmly against your incision with both hands. This is called "splinting." Doing this helps protect your incision. It also decreases belly discomfort.  If you are being admitted to the hospital overnight, leave your suitcase  in the car. After surgery it may be brought to your room.  In case of increased patient census, it may be necessary for you, the patient, to continue your postoperative care in the Same Day Surgery department.  If you are being discharged the day of surgery, you will not be allowed to drive home. You will need a responsible individual to drive you home and stay with you for 24 hours after surgery.   If you are taking public transportation, you will need to have a responsible individual with you.  Please call the [place] Dept. at [phone] if you have any questions about these instructions.  Surgery Visitation Policy:  Patients undergoing a surgery or procedure may have two family members or support persons with them as long as the person is not COVID-19 positive or experiencing its symptoms.   Inpatient Visitation:    Visiting hours are 7 a.m. to 8 p.m. Up to four visitors are allowed at one time in a patient room. The visitors may rotate out with other people during the day. One designated support person (adult) may remain overnight.  Due to an increase in RSV and influenza rates and associated hospitalizations, children ages 41 and under will not be able to visit patients in [facility]. Masks continue to be strongly recommended.     Preparing for Surgery with CHLORHEXIDINE GLUCONATE (CHG) Soap  Chlorhexidine Gluconate (CHG) Soap  o An antiseptic cleaner that kills germs and bonds with the skin to continue killing germs even after washing  o Used for showering the night before surgery and morning of surgery  Before surgery, you can play an important role by reducing the number of germs on your skin.  CHG (Chlorhexidine gluconate) soap is an antiseptic cleanser which kills germs and bonds with the skin to continue killing germs even after washing.  Please do not use if you have an allergy to CHG or antibacterial soaps. If your skin becomes  reddened/irritated stop using the CHG.  1. Shower the NIGHT BEFORE SURGERY and the MORNING OF SURGERY with CHG soap.  2. If you choose to wash your hair, wash your hair first as usual with your normal shampoo.  3. After shampooing, rinse your hair and body thoroughly to remove the shampoo.  4. Use CHG as you would any other liquid soap. You can apply CHG directly to the skin and wash gently with a scrungie or a clean washcloth.  5. Apply the CHG soap to your body only from the neck down. Do not use on open wounds or open sores. Avoid contact with your eyes, ears, mouth, and genitals (private parts). Wash face and genitals (private parts) with your normal soap.  6. Wash thoroughly, paying special attention to the area where your surgery will be performed.  7. Thoroughly rinse your body with warm water.  8. Do not shower/wash with your normal soap after using and rinsing off the CHG soap.  9. Pat yourself dry with a clean towel.  10. Wear clean pajamas to bed the night before surgery.  12. Place clean sheets on your bed the  night of your first shower and do not sleep with pets.  13. Shower again with the CHG soap on the day of surgery prior to arriving at the hospital.  14. Do not apply any deodorants/lotions/powders.  15. Please wear clean clothes to the hospital.   How to Use an Incentive Spirometer  An incentive spirometer is a tool that measures how well you are filling your lungs with each breath. Learning to take long, deep breaths using this tool can help you keep your lungs clear and active. This may help to reverse or lessen your chance of developing breathing (pulmonary) problems, especially infection. You may be asked to use a spirometer: After a surgery. If you have a lung problem or a history of smoking. After a long period of time when you have been unable to move or be active. If the spirometer includes an indicator to show the highest number that you have reached,  your health care provider or respiratory therapist will help you set a goal. Keep a log of your progress as told by your health care provider. What are the risks? Breathing too quickly may cause dizziness or cause you to pass out. Take your time so you do not get dizzy or light-headed. If you are in pain, you may need to take pain medicine before doing incentive spirometry. It is harder to take a deep breath if you are having pain. How to use your incentive spirometer  Sit up on the edge of your bed or on a chair. Hold the incentive spirometer so that it is in an upright position. Before you use the spirometer, breathe out normally. Place the mouthpiece in your mouth. Make sure your lips are closed tightly around it. Breathe in slowly and as deeply as you can through your mouth, causing the piston or the ball to rise toward the top of the chamber. Hold your breath for 3-5 seconds, or for as long as possible. If the spirometer includes a coach indicator, use this to guide you in breathing. Slow down your breathing if the indicator goes above the marked areas. Remove the mouthpiece from your mouth and breathe out normally. The piston or ball will return to the bottom of the chamber. Rest for a few seconds, then repeat the steps 10 or more times. Take your time and take a few normal breaths between deep breaths so that you do not get dizzy or light-headed. Do this every 1-2 hours when you are awake. If the spirometer includes a goal marker to show the highest number you have reached (best effort), use this as a goal to work toward during each repetition. After each set of 10 deep breaths, cough a few times. This will help to make sure that your lungs are clear. If you have an incision on your chest or abdomen from surgery, place a pillow or a rolled-up towel firmly against the incision when you cough. This can help to reduce pain while taking deep breaths and coughing. General tips When you are  able to get out of bed: Walk around often. Continue to take deep breaths and cough in order to clear your lungs. Keep using the incentive spirometer until your health care provider says it is okay to stop using it. If you have been in the hospital, you may be told to keep using the spirometer at home. Contact a health care provider if: You are having difficulty using the spirometer. You have trouble using the spirometer as often as  instructed. Your pain medicine is not giving enough relief for you to use the spirometer as told. You have a fever. Get help right away if: You develop shortness of breath. You develop a cough with bloody mucus from the lungs. You have fluid or blood coming from an incision site after you cough. Summary An incentive spirometer is a tool that can help you learn to take long, deep breaths to keep your lungs clear and active. You may be asked to use a spirometer after a surgery, if you have a lung problem or a history of smoking, or if you have been inactive for a long period of time. Use your incentive spirometer as instructed every 1-2 hours while you are awake. If you have an incision on your chest or abdomen, place a pillow or a rolled-up towel firmly against your incision when you cough. This will help to reduce pain. Get help right away if you have shortness of breath, you cough up bloody mucus, or blood comes from your incision when you cough. This information is not intended to replace advice given to you by your health care provider. Make sure you discuss any questions you have with your health care provider. Document Revised: 08/09/2019 Document Reviewed: 08/09/2019 Elsevier Patient Education  [place].   Please go to the following website to access important education materials concerning your upcoming joint replacement.                                   [email or web address]

## 2022-07-31 NOTE — Progress Notes (Signed)
Perioperative Services Pre-Admission/Anesthesia Testing    Date: 07/31/22  Name: Debbie Montgomery [facility] MRN:   [id number]  Re: Abnormal preoperative ECG   Planned Surgical Procedure(s):  RIGHT TOTAL HIP ARTHROPLASTY ANTERIOR APPROACH        08/12/2022  Surgeon: Steffanie Rainwater, MD  Clinical Notes:  Patient is scheduled for the above procedure on 08/12/2022 with Dr. Steffanie Rainwater, MD. In preparation for procedure, patient presented to the PAT clinic on the afternoon of 07/31/2022 for labs and ECG.  In review of her preoperative ECG, patient with tracing showing normal sinus rhythm at a rate of 60 bpm with an associated LBBB.    Patient denies any significant cardiovascular history, and has never been seen by cardiology.  Patient with several cardiovascular risk factors:   Age  Sex HTN Former smoking history, ETOH use.  (+) FHx: HTN (mother), "heart disease" (father), CVA + MI (maternal grandmother)  She denies an form of illegal substance use. While in clinic, patient reporting intermittent episodes of non-specific retrosternal chest pressure over the course of the last 10 days. Patient denies any associated SOB, radiation of the pain into the neck/back/jaw/subscapular areas/upper extremities. She has not appreciated any increased in fatigue, unexplained vertiginous symptoms, diaphoresis, or nausea.   ECG:    Impression and Plan:  Debbie Montgomery presented to the PAT clinic for preoperative evaluation.  Patient with abnormal ECG showing presumably new onset LBBB.  Patient experiencing (+) vague chest pressure.  She does have CAD risk factors and a (+) familial history.  Referred patient to cardiology for further evaluation and clearance prior to elective upcoming orthopedic surgery.  No changes are being made to the OR schedule at this time, however patient is aware that surgery to proceed pending cardiovascular valuation clearance.  Of note, patient's spouse is followed by  Dr. Nelva Bush, MD with [facility] and patient is requesting to see the same provider.    Patient made aware that referral will be entered today and someone from the cardiology office should be calling them to schedule an appointment.  Will send copy of note to primary attending surgeon to make them aware of preoperative ECG and need for cardiovascular evaluation/clearance.  Will plan on following up plans for ongoing evaluation with cardiology.  Anticipate noninvasive cardiovascular studies prior to surgery.  Timing of appointment and any testing that is deemed necessary is contingent upon availability of cardiology office. Discussed return precautions with patient.  She was advised on the need to seek further urgent/emergent evaluation should she develop significant angina/anginal equivalent symptoms.  Patient verbalized understanding.  Orders Placed This Encounter  Procedures   Ambulatory referral to Cardiology    Referral Priority:   Routine    Referral Type:   Consultation    Referral Reason:   Specialty Services Required    Referred to Provider:   Nelva Bush, MD    Number of Visits Requested:   1    Reason for consult: New patient. Spouse sees Dr. Saunders Revel and would like for wife to also see this provider as he is very complimentary of his care. Patient with presumably new LBBB. (+) CV risk factors. No previous CV history; never seen by cardiology service. Patient reporting 10 days of vague "chest pressure" with no other associated symptoms. Needs eval and clearance prior to upcoming elective orthopedic surgery that is currently scheduled for 08/12/2022.    Encounter Diagnoses  Name Primary?   Pre-operative cardiovascular examination Yes   Abnormal EKG  New onset left bundle branch block (LBBB)    Chest pressure    Honor Loh, MSN, APRN, FNP-C, CEN [facility]  Peri-operative Services Nurse Practitioner Phone: [phone] 07/31/22 2:18 PM  NOTE:  This note has been prepared using Dragon dictation software. Despite my best ability to proofread, there is always the potential that unintentional transcriptional errors may still occur from this process.

## 2022-08-02 ENCOUNTER — Encounter: Payer: Self-pay | Admitting: Cardiovascular Disease

## 2022-08-02 ENCOUNTER — Ambulatory Visit: Payer: Medicare Other | Attending: Cardiology | Admitting: Cardiovascular Disease

## 2022-08-02 VITALS — BP 136/78 | HR 74 | Ht 65.0 in | Wt 144.0 lb

## 2022-08-02 DIAGNOSIS — Z87891 Personal history of nicotine dependence: Secondary | ICD-10-CM | POA: Insufficient documentation

## 2022-08-02 DIAGNOSIS — I447 Left bundle-branch block, unspecified: Secondary | ICD-10-CM | POA: Insufficient documentation

## 2022-08-02 DIAGNOSIS — I209 Angina pectoris, unspecified: Secondary | ICD-10-CM | POA: Insufficient documentation

## 2022-08-02 DIAGNOSIS — R079 Chest pain, unspecified: Secondary | ICD-10-CM | POA: Diagnosis not present

## 2022-08-02 MED ORDER — METOPROLOL TARTRATE 100 MG PO TABS: 100.0000 mg | ORAL_TABLET | Freq: Once | ORAL | 0 refills | Status: DC

## 2022-08-02 NOTE — Progress Notes (Signed)
Cardiology Office Note  Date:  08/02/2022   ID:  Kristine Winget Bluefield, DOB Aug 04, 1945, MRN [id number]  PCP:  Juline Patch, MD   Chief Complaint  Patient presents with   Other    Cardiac clearance no complaints today Meds reviewed verbally with pt.    HPI:  Ms. Debbie Montgomery is a 77 year old woman with past medical history of New left bundle branch block Former smoker, quit 2013, total 20 years  Who presents for preoperative cardiac evaluation  Reports having a fall in September 2023 while playing pickle ball Suffered a fracture Had surgical repair, feels that she recovered well No complications from surgery  Reports having chronic and worsening hip pain  Has been told she has bone-on-bone, require surgery currently scheduled for August 12, 2022  On preop evaluation noted to have left bundle branch block on EKG  At baseline typically does not have angina but she does report having rare episodes of tightness in the chest but often not associated with exertion These episodes are rare, described as a squeezing sometimes with radiation to her neck  Total cholesterol 154 LDL 65 A1c 5.4  EKG personally reviewed by myself on todays visit Normal sinus rhythm rate 74 bpm left bundle branch block   PMH:   has a past medical history of Arthritis, DDD (degenerative disc disease), lumbar, HLD (hyperlipidemia), Hypertension, and New onset left bundle branch block (LBBB) (07/31/2022).  PSH:    Past Surgical History:  Procedure Laterality Date   ABDOMINAL HYSTERECTOMY     bladder lift  1995   COLONOSCOPY     EYE SURGERY  2005   lasix   IR KYPHO LUMBAR INC FX REDUCE BONE BX UNI/BIL CANNULATION INC/IMAGING  04/09/2022   IR RADIOLOGIST EVAL & MGMT  04/02/2022   IR RADIOLOGIST EVAL & MGMT  04/19/2022   LASER ABLATION Right    TONSILLECTOMY AND ADENOIDECTOMY     as a child   TUBAL LIGATION      Current Outpatient Medications  Medication Sig Dispense Refill   alendronate  (FOSAMAX) 70 MG tablet Take 1 tablet (70 mg total) by mouth every 7 (seven) days. Take with a full glass of water on an empty stomach. 12 tablet 0   Ascorbic Acid (VITAMIN C) 1000 MG tablet Take 1,000 mg by mouth daily.     aspirin-acetaminophen-caffeine (EXCEDRIN MIGRAINE) 250-250-65 MG tablet Take 2 tablets by mouth every 6 (six) hours as needed for headache.     atorvastatin (LIPITOR) 10 MG tablet Take 1 tablet (10 mg total) by mouth daily. 90 tablet 1   [employer] FIBER GUMMIES PO Take by mouth.     diphenhydramine-acetaminophen (TYLENOL PM) 25-500 MG TABS tablet Take 1 tablet by mouth at bedtime as needed.     FLUoxetine (PROZAC) 20 MG tablet Take 1 tablet (20 mg total) by mouth daily. 90 tablet 1   hydrochlorothiazide (HYDRODIURIL) 12.5 MG tablet Take 1 tablet (12.5 mg total) by mouth daily. 90 tablet 1   HYDROcodone-acetaminophen (NORCO/VICODIN) 5-325 MG tablet Take by mouth.     KRILL OIL OMEGA-3 PO Take by mouth. Krill oil 1600 mg     Lactobacillus (PROBIOTIC ACIDOPHILUS PO) Take by mouth.     METAMUCIL FIBER PO Take by mouth.     metoprolol tartrate (LOPRESSOR) 100 MG tablet Take 1 tablet (100 mg total) by mouth once for 1 dose. Take 2 hours before the test. 1 tablet 0   OVER THE COUNTER MEDICATION Garcinia Cambogia Extract 1600 mg  Calcium 222 mg     OVER THE COUNTER MEDICATION Fish oil 1413 mg  EPA 381 mg  DHA 254 mg Turmeric 400 mg     senna (SENOKOT) 8.6 MG tablet Take 1 tablet by mouth daily.     valACYclovir (VALTREX) 1000 MG tablet Take 1,000 mg by mouth daily. Dr Corley/ Derm     No current facility-administered medications for this visit.     Allergies:   Patient has no known allergies.   Social History:  The patient  reports that she has quit smoking. She has never used smokeless tobacco. She reports current alcohol use of about 1.0 standard drink of alcohol per week. She reports that she does not use drugs.   Family History:   family history includes Heart attack in her  maternal grandmother; Heart disease in her father; Hypertension in her mother; Stroke in her maternal grandmother.    Review of Systems: Review of Systems  Constitutional: Negative.   HENT: Negative.    Respiratory: Negative.    Cardiovascular: Negative.   Gastrointestinal: Negative.   Musculoskeletal: Negative.   Neurological: Negative.   Psychiatric/Behavioral: Negative.    All other systems reviewed and are negative.    PHYSICAL EXAM: VS:  BP 136/78 (BP Location: Right Arm, Patient Position: Sitting, Cuff Size: Normal)   Pulse 74   Ht '5\' 5"'$  (1.651 m)   Wt 144 lb (65.3 kg)   SpO2 98%   BMI 23.96 kg/m  , BMI Body mass index is 23.96 kg/m. GEN: Well nourished, well developed, in no acute distress HEENT: normal Neck: no JVD, carotid bruits, or masses Cardiac: RRR; no murmurs, rubs, or gallops,no edema  Respiratory:  clear to auscultation bilaterally, normal work of breathing GI: soft, nontender, nondistended, + BS MS: no deformity or atrophy Skin: warm and dry, no rash Neuro:  Strength and sensation are intact Psych: euthymic mood, full affect   Recent Labs: 07/31/2022: ALT 12; BUN 20; Creatinine, Ser 0.54; Hemoglobin 13.4; Platelets 233; Potassium 3.4; Sodium 136    Lipid Panel Lab Results  Component Value Date   CHOL 154 04/18/2022   HDL 79 04/18/2022   LDLCALC 65 04/18/2022   TRIG 47 04/18/2022      Wt Readings from Last 3 Encounters:  08/02/22 144 lb (65.3 kg)  07/31/22 144 lb (65.3 kg)  07/09/22 146 lb (66.2 kg)       ASSESSMENT AND PLAN:  Problem List Items Addressed This Visit   None Visit Diagnoses     LBBB (left bundle branch block)    -  Primary   Relevant Medications   metoprolol tartrate (LOPRESSOR) 100 MG tablet   Other Relevant Orders   EKG 12-Lead   CT CORONARY MORPH W/CTA COR W/SCORE W/CA W/CM &/OR WO/CM   Chest pain of uncertain etiology       Angina pectoris (HCC)       Relevant Medications   metoprolol tartrate (LOPRESSOR)  100 MG tablet   Former smoker         Preop cardiovascular evaluation New left bundle branch block No prior EKG available for comparison, no prior EKG on file Timing unclear In general reports she is asymptomatic with no significant anginal symptoms on exertion. Rare episodes of chest and throat tightness typically not associated with exertion, less likely cardiac etiology She would be acceptable risk for hip surgery August 12, 2022, No further cardiac testing needed prior to her surgery given her current presentation Following hip surgery, cardiac CTA  can be ordered at her convenience for further evaluation of her atypical chest pain, cardiac risk factors including smoking -She prefers not to delay surgery given severity of her discomfort     Total encounter time more than 60 minutes  Greater than 50% was spent in counseling and coordination of care with the patient    Signed, Esmond Plants, M.D., Ph.D. [place], [place]

## 2022-08-02 NOTE — Patient Instructions (Addendum)
Cardiac CTA   Medication Instructions:  No changes  If you need a refill on your cardiac medications before your next appointment, please call your pharmacy.   Lab work: No new labs needed  Follow up as needed.  Testing/Procedures:   Your cardiac CT will be scheduled at the below location:    [facility] [address] [place], [place] [address] [phone]   If scheduled at [facility] or [facility], please arrive 15 mins early for check-in and test prep.   Please follow these instructions carefully (unless otherwise directed):  Hold all erectile dysfunction medications at least 3 days (72 hrs) prior to test. (Ie viagra, cialis, sildenafil, tadalafil, etc) We will administer nitroglycerin during this exam.   On the Night Before the Test: Be sure to Drink plenty of water. Do not consume any caffeinated/decaffeinated beverages or chocolate 12 hours prior to your test. Do not take any antihistamines 12 hours prior to your test.  On the Day of the Test: Drink plenty of water until 1 hour prior to the test. Do not eat any food 1 hour prior to test. You may take your regular medications prior to the test.  Take metoprolol (Lopressor) 100 mg two hours prior to test. If you take Hydrochlorothiazide please HOLD on the morning of the test. FEMALES- please wear underwire-free bra if available, avoid dresses & tight clothing       After the Test: Drink plenty of water. After receiving IV contrast, you may experience a mild flushed feeling. This is normal. On occasion, you may experience a mild rash up to 24 hours after the test. This is not dangerous. If this occurs, you can take Benadryl 25 mg and increase your fluid intake. If you experience trouble breathing, this can be serious. If it is severe call 911 IMMEDIATELY. If it is mild, please call our office. If you take any of these  medications: Glipizide/Metformin, Avandament, Glucavance, please do not take 48 hours after completing test unless otherwise instructed.  We will call to schedule your test 2-4 weeks out understanding that some insurance companies will need an authorization prior to the service being performed.   For non-scheduling related questions, please contact the cardiac imaging nurse navigator should you have any questions/concerns: Marchia Bond, Cardiac Imaging Nurse Navigator Gordy Clement, Cardiac Imaging Nurse Navigator [facility] Direct Office Dial: [phone]   For scheduling needs, including cancellations and rescheduling, please call [place], [phone].   Follow-Up: At [facility], you and your health needs are our priority.  As part of our continuing mission to provide you with exceptional heart care, we have created designated Provider Care Teams.  These Care Teams include your primary Cardiologist (physician) and Advanced Practice Providers (APPs -  Physician Assistants and Nurse Practitioners) who all work together to provide you with the care you need, when you need it.  You will need a follow up appointment as needed  Providers on your designated Care Team:   Murray Hodgkins, NP Christell Faith, PA-C Cadence Kathlen Mody, [place]  COVID-19 Vaccine Information can be found at: [email or web address] For questions related to vaccine distribution or appointments, please email [email or web address] or call [phone].

## 2022-08-03 ENCOUNTER — Telehealth: Payer: Self-pay | Admitting: Urgent Care

## 2022-08-03 DIAGNOSIS — Z01812 Encounter for preprocedural laboratory examination: Secondary | ICD-10-CM

## 2022-08-03 DIAGNOSIS — B9620 Unspecified Escherichia coli [E. coli] as the cause of diseases classified elsewhere: Secondary | ICD-10-CM

## 2022-08-03 LAB — URINE CULTURE: Culture: >=100000 — AB

## 2022-08-03 MED ORDER — SULFAMETHOXAZOLE-TRIMETHOPRIM 800-160 MG PO TABS: 1.0000 | ORAL_TABLET | Freq: Two times a day (BID) | ORAL | 0 refills | Status: AC

## 2022-08-03 NOTE — Progress Notes (Signed)
[facility] Perioperative Services: Pre-Admission/Anesthesia Testing  Abnormal Lab Notification and Treatment Plan of Care   Date: 08/03/22  Name: Debbie Montgomery MRN:   [id number]  Re: Abnormal labs noted during PAT appointment   Notified:  Provider Name Provider Role Notification Mode  Steffanie Rainwater, MD Orthopedics (Surgeon) Routed and/or faxed via [facility]   Abnormal Lab Value(s):   Lab Results  Component Value Date   COLORURINE YELLOW (A) 07/31/2022   APPEARANCEUR HAZY (A) 07/31/2022   LABSPEC 1.021 07/31/2022   PHURINE 5.0 07/31/2022   GLUCOSEU NEGATIVE 07/31/2022   HGBUR NEGATIVE 07/31/2022   BILIRUBINUR NEGATIVE 07/31/2022   KETONESUR NEGATIVE 07/31/2022   PROTEINUR NEGATIVE 07/31/2022   UROBILINOGEN 0.2 03/23/2020   NITRITE POSITIVE (A) 07/31/2022   LEUKOCYTESUR SMALL (A) 07/31/2022   EPIU 0-5 07/31/2022   WBCU 11-20 07/31/2022   RBCU 0-5 07/31/2022   BACTERIA FEW (A) 07/31/2022   CULT >=100,000 COLONIES/mL ESCHERICHIA COLI (A) 07/31/2022   Clinical Information and Notes:  Patient is scheduled for an elective TOTAL RIGHT HIP ARTHROPLASTY ANTERIOR APPROACH on 08/12/2022.    UA performed in PAT consistent with/concerning for infection.  No leukocytosis noted on CBC; WBC 7700 Renal function: Estimated Creatinine Clearance: 55.5 mL/min (by C-G formula based on SCr of 0.54 mg/dL). Urine C&S added to assess for pathogenically significant growth.  Impression and Plan:  Debbie Montgomery with a UA that was (+) for infection; reflex culture sent. Contacted patient to discuss. Patient reporting that she is experiencing some frequency. Suspect early/developing infection. Patient with surgery scheduled soon. In efforts to avoid delaying patient's procedure, or have her experience any potentially significant perioperative complications related to the aforementioned, I would like to proceed with empiric treatment for urinary tract  infection.  Allergies reviewed. Culture report also reviewed to ensure culture appropriate coverage is being provided. Will treat with a 3 day course of SMZ-TMP DS. Patient encouraged to complete the entire course of antibiotics even if she begins to feel better. She was advised that if culture demonstrates resistance to the prescribed antibiotic, she will be contacted and advised of the need to change the antibiotic being used to treat her infection.   Meds ordered this encounter  Medications   sulfamethoxazole-trimethoprim (BACTRIM DS) 800-160 MG tablet    Sig: Take 1 tablet by mouth 2 (two) times daily for 3 days. Increase your WATER intake while taking this medication.    Dispense:  6 tablet    Refill:  0   Patient encouraged to increase her fluid intake as much as possible. Discussed that water is always best to flush the urinary tract. She was advised to avoid caffeine containing fluids until her infections clears, as caffeine can cause her to experience painful bladder spasms.   May use Tylenol as needed for pain/fever should she experience these symptoms.   Patient instructed to call surgeon's office or PAT with any questions or concerns related to the above outlined course of treatment. Additionally, she was instructed to call if she feels like she is getting worse overall while on treatment. Results and treatment plan of care forwarded to primary attending surgeon to make them aware.   Encounter Diagnoses  Name Primary?   Pre-operative laboratory examination Yes   E. coli UTI (urinary tract infection)    Honor Loh, MSN, APRN, FNP-C, CEN [facility]  Peri-operative Services Nurse Practitioner Phone: [phone] Fax: [phone] 08/03/22 9:24 AM  NOTE: This note has been prepared using  [employer]. Despite my best ability to proofread, there is always the potential that unintentional transcriptional errors may still occur from this  process.

## 2022-08-05 ENCOUNTER — Ambulatory Visit: Payer: Medicare Other | Admitting: Family Medicine

## 2022-08-05 ENCOUNTER — Telehealth: Payer: Self-pay

## 2022-08-05 ENCOUNTER — Ambulatory Visit
Admission: RE | Admit: 2022-08-05 | Discharge: 2022-08-05 | Disposition: A | Discharge status: Home / Self Care | Payer: Medicare Other | Source: Ambulatory Visit | Attending: Cardiovascular Disease | Admitting: Cardiovascular Disease

## 2022-08-05 DIAGNOSIS — I447 Left bundle-branch block, unspecified: Secondary | ICD-10-CM | POA: Insufficient documentation

## 2022-08-05 MED ORDER — IOHEXOL 350 MG/ML SOLN
100.0000 mL | Freq: Once | INTRAVENOUS | Status: AC | PRN
  Administered 2022-08-05: 100 mL via INTRAVENOUS

## 2022-08-05 MED ORDER — NITROGLYCERIN 0.4 MG SL SUBL
0.8000 mg | SUBLINGUAL_TABLET | Freq: Once | SUBLINGUAL | Status: AC
  Administered 2022-08-05: 0.8 mg via SUBLINGUAL
  Filled 2022-08-05: qty 25

## 2022-08-05 NOTE — Progress Notes (Addendum)
Patient tolerated procedure well. Ambulate w/o difficulty. Denies any lightheadedness or being dizzy. Pt denies any pain at this time. Sitting in chair, pt is encouraged to drink additional water throughout the day and reason explained to patient. Patient verbalized understanding and all questions answered. ABC intact. No further needs at this time. Discharge from procedure area w/o issues.  

## 2022-08-05 NOTE — Telephone Encounter (Signed)
Pt scheduled for Cardiac CTA today 08/05/22.

## 2022-08-05 NOTE — Telephone Encounter (Signed)
[name], RN  P Cv Div [place] Triage This patient needs CCTA done as soon as possible. They are trying to get it done before her upcoming surgery but if it is not possible then it will have to be after that time. Please call to get this scheduled. Instructions have been reviewed with patient.

## 2022-08-06 ENCOUNTER — Telehealth: Payer: Self-pay | Admitting: Cardiovascular Disease

## 2022-08-06 ENCOUNTER — Ambulatory Visit: Payer: Medicare Other | Admitting: Cardiology

## 2022-08-06 NOTE — Telephone Encounter (Signed)
Verbally reviewed what transpired with the patient's scan with Dr. Rockey Situ. Forwarding to MD to review.  Update received from [place] they are working to correct the error so the report shows up in the patient's chart.

## 2022-08-06 NOTE — Telephone Encounter (Signed)
Reviewed Dr. Donivan Scull 08/02/22 schedule for follow up of patient appointments/ testing.   This patient had a Cardiac CT scheduled for 08/05/22, but her appointment in [place] was cancelled with no clear reason as to why other than "error."  I reached out to [place], [place] for assistance via secure chat.   Per Colletta [place], after further review: "So come to find out she was scanned. somehow Dr. Mylo Red and the Radiologist read her but then someone cancelled it in [place]..it is being resolved...here is her report in [place]"   CLINICAL DATA: Chest pain, new LBBB, pre op eval   EXAM: Cardiac/Coronary CTA   TECHNIQUE: The patient was scanned on a Siemens Somatom go.Top scanner.   A retrospective scan was triggered in the ascending thoracic aorta. Axial non-contrast 3 mm slices were carried out through the heart. The data set was analyzed on a dedicated work station and scored using the Central. Gantry rotation speed was 66 msecs and collimation was .6 mm. '100mg'$  of metoprolol and 0.8 mg of sl NTG was given. The 3D data set was reconstructed in 5% intervals of the 60-95 % of the R-R cycle. Diastolic phases were analyzed on a dedicated work station using MPR, MIP and VRT modes. The patient received 100 cc of contrast.    FINDINGS:  Aorta: Normal size. No calcifications. No dissection.   Aortic Valve: Trileaflet. No calcifications.  Coronary Arteries: Normal coronary origin. Right dominance.   RCA is a dominant artery. There is calcified plaque proximally causing minimal stenosis (<25%).   Left main gives rise to LAD and LCX arteries. LM has no disease.   LAD has calcified plaque in the mid vessel causing mild stenosis (25-49%).   LCX is a non-dominant artery. There is no plaque.   Other findings: Normal pulmonary vein drainage into the left atrium.   Normal left atrial appendage without a thrombus.   Normal size of the pulmonary artery.   IMPRESSION:  1. Coronary calcium score of  188. This was 73rd percentile for age and sex matched control.   2. Normal coronary origin with right dominance.   3. Mild mid LAD stenosis (25-49%).   4. Minimal proximal RCA stenosis (<25%).   5. CAD-RADS 2. Mild non-obstructive CAD (25-49%). Consider non-atherosclerotic causes of chest pain. Consider preventive therapy and risk factor modification.   Electronically Signed: By: Kate Sable M.D. On: 08/05/2022 15:43   ADDENDUM REPORT: 08/05/2022 16:02   EXAM: OVER-READ INTERPRETATION CT CHEST  The following report is an over-read performed by radiologist Dr. Carolynn Comment [facility] on 08/05/2022. This over-read does not include interpretation of cardiac or coronary anatomy or pathology. The coronary CTA interpretation by the cardiologist is attached.   COMPARISON: None.    FINDINGS: Visualized esophagus is within normal limits. There are calcified mediastinal and bilateral hilar lymph nodes.. No acute lung infiltrate identified. There is some linear scarring or atelectasis in the right mid lung. No pleural effusion. Visualized upper abdomen is within normal limits. No acute osseous abnormalities.   IMPRESSION: Calcified mediastinal and bilateral hilar lymph nodes compatible with old granulomatous disease.   Electronically Signed By: Ronney Asters M.D. On: 08/05/2022 16:02

## 2022-08-12 ENCOUNTER — Ambulatory Visit: Payer: Medicare Other

## 2022-08-12 ENCOUNTER — Encounter
Admission: RE | Disposition: A | Discharge status: Home Health | Payer: Self-pay | Source: Home / Self Care | Attending: Orthopedic Surgery

## 2022-08-12 ENCOUNTER — Observation Stay
Admission: RE | Admit: 2022-08-12 | Discharge: 2022-08-13 | Disposition: A | Discharge status: Home Health | Payer: Medicare Other | Attending: Orthopedic Surgery | Admitting: Orthopedic Surgery

## 2022-08-12 ENCOUNTER — Other Ambulatory Visit: Payer: Self-pay

## 2022-08-12 ENCOUNTER — Ambulatory Visit: Payer: Medicare Other | Admitting: Urgent Care

## 2022-08-12 ENCOUNTER — Other Ambulatory Visit: Payer: Self-pay | Admitting: Family Medicine

## 2022-08-12 ENCOUNTER — Ambulatory Visit: Payer: Medicare Other | Admitting: Registered Nurse

## 2022-08-12 ENCOUNTER — Encounter: Payer: Self-pay | Admitting: Orthopedic Surgery

## 2022-08-12 DIAGNOSIS — Z79899 Other long term (current) drug therapy: Secondary | ICD-10-CM | POA: Insufficient documentation

## 2022-08-12 DIAGNOSIS — M816 Localized osteoporosis [Lequesne]: Secondary | ICD-10-CM

## 2022-08-12 DIAGNOSIS — I10 Essential (primary) hypertension: Secondary | ICD-10-CM | POA: Insufficient documentation

## 2022-08-12 DIAGNOSIS — M1611 Unilateral primary osteoarthritis, right hip: Secondary | ICD-10-CM | POA: Diagnosis present

## 2022-08-12 HISTORY — PX: TOTAL HIP ARTHROPLASTY: SHX124

## 2022-08-12 LAB — ABO/RH: ABO/RH(D): O POS

## 2022-08-12 SURGERY — ARTHROPLASTY, HIP, TOTAL, ANTERIOR APPROACH
Anesthesia: General | Site: Hip | Laterality: Right

## 2022-08-12 MED ORDER — MIDAZOLAM HCL 2 MG/2ML IJ SOLN
INTRAMUSCULAR | Status: AC
  Filled 2022-08-12: qty 2

## 2022-08-12 MED ORDER — ONDANSETRON HCL 4 MG/2ML IJ SOLN
INTRAMUSCULAR | Status: AC
  Filled 2022-08-12: qty 2

## 2022-08-12 MED ORDER — PHENYLEPHRINE HCL-NACL 20-0.9 MG/250ML-% IV SOLN
INTRAVENOUS | Status: AC
  Filled 2022-08-12: qty 250

## 2022-08-12 MED ORDER — CEFAZOLIN SODIUM-DEXTROSE 2-4 GM/100ML-% IV SOLN
2.0000 g | Freq: Four times a day (QID) | INTRAVENOUS | Status: AC
  Filled 2022-08-12 (×2): qty 100

## 2022-08-12 MED ORDER — MIDAZOLAM HCL 5 MG/5ML IJ SOLN
INTRAMUSCULAR | Status: DC | PRN
  Administered 2022-08-12: 2 mg via INTRAVENOUS

## 2022-08-12 MED ORDER — DEXAMETHASONE SODIUM PHOSPHATE 10 MG/ML IJ SOLN: 8.0000 mg | Freq: Once | INTRAMUSCULAR | Status: AC

## 2022-08-12 MED ORDER — PROPOFOL 10 MG/ML IV BOLUS
INTRAVENOUS | Status: AC
  Filled 2022-08-12: qty 20

## 2022-08-12 MED ORDER — FENTANYL CITRATE (PF) 100 MCG/2ML IJ SOLN
INTRAMUSCULAR | Status: DC | PRN
  Administered 2022-08-12: 25 ug via INTRAVENOUS

## 2022-08-12 MED ORDER — PHENYLEPHRINE HCL-NACL 20-0.9 MG/250ML-% IV SOLN
INTRAVENOUS | Status: DC | PRN
  Administered 2022-08-12: 25 ug/min via INTRAVENOUS

## 2022-08-12 MED ORDER — MORPHINE SULFATE (PF) 4 MG/ML IV SOLN: 0.5000 mg | INTRAVENOUS | Status: DC | PRN

## 2022-08-12 MED ORDER — PROPOFOL 1000 MG/100ML IV EMUL
INTRAVENOUS | Status: AC
  Filled 2022-08-12: qty 100

## 2022-08-12 MED ORDER — BSS IO SOLN
15.0000 mL | Freq: Once | INTRAOCULAR | Status: DC
  Filled 2022-08-12: qty 15

## 2022-08-12 MED ORDER — ENOXAPARIN SODIUM 40 MG/0.4ML IJ SOSY: 40.0000 mg | PREFILLED_SYRINGE | INTRAMUSCULAR | Status: DC

## 2022-08-12 MED ORDER — FENTANYL CITRATE (PF) 100 MCG/2ML IJ SOLN
INTRAMUSCULAR | Status: AC
  Filled 2022-08-12: qty 2

## 2022-08-12 MED ORDER — PROPOFOL 500 MG/50ML IV EMUL
INTRAVENOUS | Status: DC | PRN
  Administered 2022-08-12: 75 ug/kg/min via INTRAVENOUS

## 2022-08-12 MED ORDER — CHLORHEXIDINE GLUCONATE 0.12 % MT SOLN: 15.0000 mL | Freq: Once | OROMUCOSAL | Status: AC

## 2022-08-12 MED ORDER — ACETAMINOPHEN 10 MG/ML IV SOLN
INTRAVENOUS | Status: AC
  Filled 2022-08-12: qty 100

## 2022-08-12 MED ORDER — TRANEXAMIC ACID 1000 MG/10ML IV SOLN
INTRAVENOUS | Status: AC
  Filled 2022-08-12: qty 10

## 2022-08-12 MED ORDER — KETOROLAC TROMETHAMINE 0.5 % OP SOLN
1.0000 [drp] | Freq: Four times a day (QID) | OPHTHALMIC | Status: DC
  Administered 2022-08-12 – 2022-08-13 (×3): 1 [drp] via OPHTHALMIC
  Filled 2022-08-12: qty 3

## 2022-08-12 MED ORDER — BUPIVACAINE LIPOSOME 1.3 % IJ SUSP
INTRAMUSCULAR | Status: AC
  Filled 2022-08-12: qty 20

## 2022-08-12 MED ORDER — EPHEDRINE SULFATE (PRESSORS) 50 MG/ML IJ SOLN
INTRAMUSCULAR | Status: DC | PRN
  Administered 2022-08-12: 7.5 mg via INTRAVENOUS

## 2022-08-12 MED ORDER — ATORVASTATIN CALCIUM 20 MG PO TABS
ORAL_TABLET | ORAL | Status: AC
  Administered 2022-08-12: 10 mg via ORAL
  Filled 2022-08-12: qty 1

## 2022-08-12 MED ORDER — KETOROLAC TROMETHAMINE 15 MG/ML IJ SOLN
7.5000 mg | Freq: Four times a day (QID) | INTRAMUSCULAR | Status: AC
  Administered 2022-08-12 (×2): 7.5 mg via INTRAVENOUS

## 2022-08-12 MED ORDER — SODIUM CHLORIDE 0.9 % IV SOLN: INTRAVENOUS | Status: DC

## 2022-08-12 MED ORDER — ONDANSETRON HCL 4 MG PO TABS: 4.0000 mg | ORAL_TABLET | Freq: Four times a day (QID) | ORAL | Status: DC | PRN

## 2022-08-12 MED ORDER — PANTOPRAZOLE SODIUM 40 MG PO TBEC
DELAYED_RELEASE_TABLET | ORAL | Status: AC
  Administered 2022-08-12: 40 mg via ORAL
  Filled 2022-08-12: qty 1

## 2022-08-12 MED ORDER — TRANEXAMIC ACID-NACL 1000-0.7 MG/100ML-% IV SOLN
INTRAVENOUS | Status: AC
  Filled 2022-08-12: qty 100

## 2022-08-12 MED ORDER — CHLORHEXIDINE GLUCONATE 0.12 % MT SOLN
OROMUCOSAL | Status: AC
  Administered 2022-08-12: 15 mL via OROMUCOSAL
  Filled 2022-08-12: qty 15

## 2022-08-12 MED ORDER — VALACYCLOVIR HCL 500 MG PO TABS
1000.0000 mg | ORAL_TABLET | Freq: Every day | ORAL | Status: DC
  Administered 2022-08-12 – 2022-08-13 (×2): 1000 mg via ORAL
  Filled 2022-08-12 (×2): qty 2

## 2022-08-12 MED ORDER — IRRISEPT - 450ML BOTTLE WITH 0.05% CHG IN STERILE WATER, USP 99.95% OPTIME
TOPICAL | Status: DC | PRN
  Administered 2022-08-12: 450 mL

## 2022-08-12 MED ORDER — LACTATED RINGERS IV SOLN: INTRAVENOUS | Status: DC

## 2022-08-12 MED ORDER — ONDANSETRON HCL 4 MG/2ML IJ SOLN
INTRAMUSCULAR | Status: DC | PRN
  Administered 2022-08-12: 4 mg via INTRAVENOUS

## 2022-08-12 MED ORDER — HYDROCODONE-ACETAMINOPHEN 5-325 MG PO TABS
ORAL_TABLET | ORAL | Status: AC
  Filled 2022-08-12: qty 1

## 2022-08-12 MED ORDER — ORAL CARE MOUTH RINSE: 15.0000 mL | Freq: Once | OROMUCOSAL | Status: AC

## 2022-08-12 MED ORDER — HYDROCODONE-ACETAMINOPHEN 5-325 MG PO TABS
1.0000 | ORAL_TABLET | ORAL | Status: DC | PRN
  Administered 2022-08-12: 1 via ORAL

## 2022-08-12 MED ORDER — SODIUM CHLORIDE 0.9 % IR SOLN
Status: DC | PRN
  Administered 2022-08-12: 100 mL

## 2022-08-12 MED ORDER — EPHEDRINE 5 MG/ML INJ
INTRAVENOUS | Status: AC
  Filled 2022-08-12: qty 5

## 2022-08-12 MED ORDER — 0.9 % SODIUM CHLORIDE (POUR BTL) OPTIME
TOPICAL | Status: DC | PRN
  Administered 2022-08-12: 500 mL

## 2022-08-12 MED ORDER — FLUOXETINE HCL 20 MG PO CAPS
ORAL_CAPSULE | ORAL | Status: AC
  Administered 2022-08-12: 20 mg via ORAL
  Filled 2022-08-12: qty 1

## 2022-08-12 MED ORDER — DEXAMETHASONE SODIUM PHOSPHATE 10 MG/ML IJ SOLN
INTRAMUSCULAR | Status: AC
  Administered 2022-08-12: 8 mg via INTRAVENOUS
  Filled 2022-08-12: qty 1

## 2022-08-12 MED ORDER — ATORVASTATIN CALCIUM 20 MG PO TABS: 10.0000 mg | ORAL_TABLET | Freq: Every day | ORAL | Status: DC

## 2022-08-12 MED ORDER — OXYCODONE HCL 5 MG/5ML PO SOLN: 5.0000 mg | Freq: Once | ORAL | Status: AC | PRN

## 2022-08-12 MED ORDER — OXYCODONE HCL 5 MG PO TABS
5.0000 mg | ORAL_TABLET | Freq: Once | ORAL | Status: AC | PRN
  Administered 2022-08-12: 5 mg via ORAL

## 2022-08-12 MED ORDER — KETOROLAC TROMETHAMINE 15 MG/ML IJ SOLN
INTRAMUSCULAR | Status: AC
  Filled 2022-08-12: qty 1

## 2022-08-12 MED ORDER — METOPROLOL TARTRATE 25 MG PO TABS: 100.0000 mg | ORAL_TABLET | Freq: Once | ORAL | Status: DC

## 2022-08-12 MED ORDER — PANTOPRAZOLE SODIUM 40 MG PO TBEC: 40.0000 mg | DELAYED_RELEASE_TABLET | Freq: Every day | ORAL | Status: DC

## 2022-08-12 MED ORDER — METOCLOPRAMIDE HCL 5 MG/ML IJ SOLN: 5.0000 mg | Freq: Three times a day (TID) | INTRAMUSCULAR | Status: DC | PRN

## 2022-08-12 MED ORDER — DOCUSATE SODIUM 100 MG PO CAPS
ORAL_CAPSULE | ORAL | Status: AC
  Administered 2022-08-12: 100 mg via ORAL
  Filled 2022-08-12: qty 1

## 2022-08-12 MED ORDER — METOCLOPRAMIDE HCL 5 MG PO TABS: 5.0000 mg | ORAL_TABLET | Freq: Three times a day (TID) | ORAL | Status: DC | PRN

## 2022-08-12 MED ORDER — ONDANSETRON HCL 4 MG/2ML IJ SOLN: 4.0000 mg | Freq: Four times a day (QID) | INTRAMUSCULAR | Status: DC | PRN

## 2022-08-12 MED ORDER — TRAMADOL HCL 50 MG PO TABS: 50.0000 mg | ORAL_TABLET | Freq: Four times a day (QID) | ORAL | Status: DC | PRN

## 2022-08-12 MED ORDER — DOCUSATE SODIUM 100 MG PO CAPS
100.0000 mg | ORAL_CAPSULE | Freq: Two times a day (BID) | ORAL | Status: DC
  Administered 2022-08-12: 100 mg via ORAL

## 2022-08-12 MED ORDER — HYDROCHLOROTHIAZIDE 25 MG PO TABS
ORAL_TABLET | ORAL | Status: AC
  Administered 2022-08-12: 12.5 mg via ORAL
  Filled 2022-08-12: qty 1

## 2022-08-12 MED ORDER — FLUOXETINE HCL 20 MG PO CAPS: 20.0000 mg | ORAL_CAPSULE | Freq: Every day | ORAL | Status: DC

## 2022-08-12 MED ORDER — HYDROCHLOROTHIAZIDE 25 MG PO TABS: 12.5000 mg | ORAL_TABLET | Freq: Every day | ORAL | Status: DC

## 2022-08-12 MED ORDER — DOCUSATE SODIUM 100 MG PO CAPS
ORAL_CAPSULE | ORAL | Status: AC
  Filled 2022-08-12: qty 1

## 2022-08-12 MED ORDER — FAMOTIDINE 20 MG PO TABS: 20.0000 mg | ORAL_TABLET | Freq: Once | ORAL | Status: AC

## 2022-08-12 MED ORDER — SODIUM CHLORIDE (PF) 0.9 % IJ SOLN
INTRAMUSCULAR | Status: DC | PRN
  Administered 2022-08-12: 50 mL via INTRAMUSCULAR

## 2022-08-12 MED ORDER — ACETAMINOPHEN 500 MG PO TABS
1000.0000 mg | ORAL_TABLET | Freq: Three times a day (TID) | ORAL | Status: DC
  Administered 2022-08-12 – 2022-08-13 (×2): 1000 mg via ORAL

## 2022-08-12 MED ORDER — CEFAZOLIN SODIUM-DEXTROSE 2-4 GM/100ML-% IV SOLN
2.0000 g | INTRAVENOUS | Status: AC
  Administered 2022-08-12: 2 g via INTRAVENOUS

## 2022-08-12 MED ORDER — FENTANYL CITRATE (PF) 100 MCG/2ML IJ SOLN
25.0000 ug | INTRAMUSCULAR | Status: DC | PRN
  Administered 2022-08-12 (×2): 25 ug via INTRAVENOUS

## 2022-08-12 MED ORDER — TETRACAINE HCL 0.5 % OP SOLN: 1.0000 [drp] | Freq: Four times a day (QID) | OPHTHALMIC | Status: DC | PRN

## 2022-08-12 MED ORDER — SENNA 8.6 MG PO TABS: 1.0000 | ORAL_TABLET | Freq: Every day | ORAL | Status: DC

## 2022-08-12 MED ORDER — CEFAZOLIN SODIUM-DEXTROSE 2-4 GM/100ML-% IV SOLN
INTRAVENOUS | Status: AC
  Administered 2022-08-12: 2 g via INTRAVENOUS
  Filled 2022-08-12: qty 100

## 2022-08-12 MED ORDER — BUPIVACAINE HCL (PF) 0.25 % IJ SOLN
INTRAMUSCULAR | Status: AC
  Filled 2022-08-12: qty 30

## 2022-08-12 MED ORDER — DEXAMETHASONE SODIUM PHOSPHATE 10 MG/ML IJ SOLN
INTRAMUSCULAR | Status: AC
  Filled 2022-08-12: qty 1

## 2022-08-12 MED ORDER — SENNA 8.6 MG PO TABS
ORAL_TABLET | ORAL | Status: AC
  Administered 2022-08-12: 8.6 mg via ORAL
  Filled 2022-08-12: qty 1

## 2022-08-12 MED ORDER — MENTHOL 3 MG MT LOZG: 1.0000 | LOZENGE | OROMUCOSAL | Status: DC | PRN

## 2022-08-12 MED ORDER — PHENOL 1.4 % MT LIQD: 1.0000 | OROMUCOSAL | Status: DC | PRN

## 2022-08-12 MED ORDER — OXYCODONE HCL 5 MG PO TABS
ORAL_TABLET | ORAL | Status: AC
  Filled 2022-08-12: qty 1

## 2022-08-12 MED ORDER — TRANEXAMIC ACID-NACL 1000-0.7 MG/100ML-% IV SOLN
1000.0000 mg | INTRAVENOUS | Status: AC
  Administered 2022-08-12 (×2): 1000 mg via INTRAVENOUS

## 2022-08-12 MED ORDER — EPINEPHRINE PF 1 MG/ML IJ SOLN
INTRAMUSCULAR | Status: AC
  Filled 2022-08-12: qty 1

## 2022-08-12 MED ORDER — ACETAMINOPHEN 500 MG PO TABS
ORAL_TABLET | ORAL | Status: AC
  Filled 2022-08-12: qty 2

## 2022-08-12 MED ORDER — ORAL CARE MOUTH RINSE: 15.0000 mL | OROMUCOSAL | Status: DC | PRN

## 2022-08-12 MED ORDER — PROPOFOL 10 MG/ML IV BOLUS
INTRAVENOUS | Status: DC | PRN
  Administered 2022-08-12 (×4): 30 mg via INTRAVENOUS

## 2022-08-12 MED ORDER — BUPIVACAINE HCL (PF) 0.5 % IJ SOLN
INTRAMUSCULAR | Status: DC | PRN
  Administered 2022-08-12: 2.4 mL

## 2022-08-12 MED ORDER — ACETAMINOPHEN 10 MG/ML IV SOLN
INTRAVENOUS | Status: DC | PRN
  Administered 2022-08-12: 1000 mg via INTRAVENOUS

## 2022-08-12 MED ORDER — SODIUM CHLORIDE FLUSH 0.9 % IV SOLN
INTRAVENOUS | Status: AC
  Filled 2022-08-12: qty 10

## 2022-08-12 MED ORDER — BUPIVACAINE HCL (PF) 0.5 % IJ SOLN
INTRAMUSCULAR | Status: AC
  Filled 2022-08-12: qty 10

## 2022-08-12 MED ORDER — FAMOTIDINE 20 MG PO TABS
ORAL_TABLET | ORAL | Status: AC
  Administered 2022-08-12: 20 mg via ORAL
  Filled 2022-08-12: qty 1

## 2022-08-12 MED ORDER — CEFAZOLIN SODIUM-DEXTROSE 2-4 GM/100ML-% IV SOLN
INTRAVENOUS | Status: AC
  Filled 2022-08-12: qty 100

## 2022-08-12 SURGICAL SUPPLY — 71 items
ADH SKN CLS APL DERMABOND .7 (GAUZE/BANDAGES/DRESSINGS) ×1
AGENT HMST KT MTR STRL THRMB (HEMOSTASIS) ×1
APL PRP STRL LF DISP 70% ISPRP (MISCELLANEOUS) ×1
BLADE SAGITTAL AGGR TOOTH XLG (BLADE) ×1 IMPLANT
BNDG CMPR 5X6 CHSV STRCH STRL (GAUZE/BANDAGES/DRESSINGS) ×1
BNDG COHESIVE 6X5 TAN ST LF (GAUZE/BANDAGES/DRESSINGS) ×1 IMPLANT
CHLORAPREP W/TINT 26 (MISCELLANEOUS) ×1 IMPLANT
COVER BACK TABLE REUSABLE LG (DRAPES) ×1 IMPLANT
DERMABOND ADVANCED .7 DNX12 (GAUZE/BANDAGES/DRESSINGS) ×1 IMPLANT
DRAPE 3/4 80X56 (DRAPES) ×1 IMPLANT
DRAPE C-ARM XRAY 36X54 (DRAPES) ×1 IMPLANT
DRAPE POUCH INSTRU U-SHP 10X18 (DRAPES) ×1 IMPLANT
DRSG MEPILEX SACRM 8.7X9.8 (GAUZE/BANDAGES/DRESSINGS) ×1 IMPLANT
DRSG OPSITE POSTOP 4X8 (GAUZE/BANDAGES/DRESSINGS) ×1 IMPLANT
ELECT BLADE 4.0 EZ CLEAN MEGAD (MISCELLANEOUS) ×1
ELECT REM PT RETURN 9FT ADLT (ELECTROSURGICAL) ×1
ELECTRODE BLDE 4.0 EZ CLN MEGD (MISCELLANEOUS) ×1 IMPLANT
ELECTRODE REM PT RTRN 9FT ADLT (ELECTROSURGICAL) ×1 IMPLANT
GLOVE BIO SURGEON STRL SZ8 (GLOVE) ×1 IMPLANT
GLOVE BIOGEL PI IND STRL 8 (GLOVE) ×1 IMPLANT
GLOVE PI ORTHO PRO STRL 7.5 (GLOVE) ×2 IMPLANT
GLOVE PI ORTHO PRO STRL SZ8 (GLOVE) ×2 IMPLANT
GLOVE SURG SYN 7.5  E (GLOVE) ×1
GLOVE SURG SYN 7.5 E (GLOVE) ×1 IMPLANT
GLOVE SURG SYN 7.5 PF PI (GLOVE) ×1 IMPLANT
GOWN STRL REUS W/ TWL LRG LVL3 (GOWN DISPOSABLE) ×1 IMPLANT
GOWN STRL REUS W/ TWL XL LVL3 (GOWN DISPOSABLE) ×2 IMPLANT
GOWN STRL REUS W/TWL LRG LVL3 (GOWN DISPOSABLE) ×1
GOWN STRL REUS W/TWL XL LVL3 (GOWN DISPOSABLE) ×2
HANDLE YANKAUER SUCT OPEN TIP (MISCELLANEOUS) ×1 IMPLANT
HEAD HIP LFIT V40 22 (Head) IMPLANT
HIP HD LFIT V40 22 (Head) ×1 IMPLANT
HOLDER FOLEY CATH W/STRAP (MISCELLANEOUS) ×1 IMPLANT
HOOD PEEL AWAY T7 (MISCELLANEOUS) ×2 IMPLANT
INSERT MDM LINER 22.2X38 38D (Insert) IMPLANT
IV NS 100ML SINGLE PACK (IV SOLUTION) ×1 IMPLANT
KIT PATIENT CARE HANA TABLE (KITS) ×1 IMPLANT
LIGHT WAVEGUIDE WIDE FLAT (MISCELLANEOUS) ×1 IMPLANT
LINER CEMENTLESS SZ D 38 HIP (Liner) IMPLANT
MANIFOLD NEPTUNE II (INSTRUMENTS) ×1 IMPLANT
MARKER SKIN DUAL TIP RULER LAB (MISCELLANEOUS) ×1 IMPLANT
MAT ABSORB  FLUID 56X50 GRAY (MISCELLANEOUS) ×1
MAT ABSORB FLUID 56X50 GRAY (MISCELLANEOUS) ×1 IMPLANT
NDL SPNL 20GX3.5 QUINCKE YW (NEEDLE) ×1 IMPLANT
NEEDLE SPNL 20GX3.5 QUINCKE YW (NEEDLE) ×1 IMPLANT
NS IRRIG 500ML POUR BTL (IV SOLUTION) ×1 IMPLANT
PACK HIP COMPR (MISCELLANEOUS) ×1 IMPLANT
SCREW HEX LP 6.5X25 (Screw) IMPLANT
SCREW HEX LP 6.5X30 (Screw) IMPLANT
SHELL TRIDENT II CLUST 50 (Shell) IMPLANT
SLEEVE SCD COMPRESS KNEE MED (STOCKING) ×1 IMPLANT
SOLUTION IRRIG SURGIPHOR (IV SOLUTION) ×1 IMPLANT
STEM STD OFFSET SZ5 36 (Stem) IMPLANT
SURGIFLO W/THROMBIN 8M KIT (HEMOSTASIS) IMPLANT
SUT BONE WAX W31G (SUTURE) ×1 IMPLANT
SUT DVC 2 QUILL PDO  T11 36X36 (SUTURE) ×1
SUT DVC 2 QUILL PDO T11 36X36 (SUTURE) ×1 IMPLANT
SUT ETHIBOND 2 V 37 (SUTURE) ×1 IMPLANT
SUT QUILL MONODERM 3-0 PS-2 (SUTURE) ×1 IMPLANT
SUT SILK 0 (SUTURE) ×1
SUT SILK 0 30XBRD TIE 6 (SUTURE) ×1 IMPLANT
SUT VIC AB 0 CT1 36 (SUTURE) ×1 IMPLANT
SUT VIC AB 2-0 CT2 27 (SUTURE) ×2 IMPLANT
SYR 10ML LL (SYRINGE) ×1 IMPLANT
SYR 30ML LL (SYRINGE) ×2 IMPLANT
TAPE MICROFOAM 4IN (TAPE) IMPLANT
TOWEL OR 17X26 4PK STRL BLUE (TOWEL DISPOSABLE) IMPLANT
TRAP FLUID SMOKE EVACUATOR (MISCELLANEOUS) ×1 IMPLANT
TRAY FOLEY SLVR 16FR LF STAT (SET/KITS/TRAYS/PACK) ×1 IMPLANT
WAND WEREWOLF FASTSEAL 6.0 (MISCELLANEOUS) ×1 IMPLANT
WATER STERILE IRR 1000ML POUR (IV SOLUTION) ×1 IMPLANT

## 2022-08-12 NOTE — Evaluation (Signed)
Physical Therapy Evaluation Patient Details Name: Debbie Montgomery MRN: [id number] DOB: 02-12-1946 Today's Date: 08/12/2022  History of Present Illness  Pt is a 77 yo F diagnosed wtih right hip osteoarthritis and is s/p elective R THA.  PMH includes HTN, LBBB, and DDD.  Pt also reported having had a kyphoplasty surgery within the last year, unsure of level.   Clinical Impression  Pt was pleasant and motivated to participate during the session and put forth good effort throughout. Pt required no physical assistance during the session but did require cuing for general sequencing with functional tasks.  Pt was able to stand and take several steps at the EOB and from bed to chair with slow but steady cadence with no overt LOB or buckling.  Pt reported no adverse symptoms during the session other than mild R hip pain.  Pt is expected to make very good progress towards goals while in acute care and will benefit from HHPT upon discharge to safely address deficits listed in patient problem list for decreased caregiver assistance and eventual return to PLOF.         Recommendations for follow up therapy are one component of a multi-disciplinary discharge planning process, led by the attending physician.  Recommendations may be updated based on patient status, additional functional criteria and insurance authorization.  Follow Up Recommendations Home health PT      Assistance Recommended at Discharge Intermittent Supervision/Assistance  Patient can return home with the following  A little help with walking and/or transfers;A little help with bathing/dressing/bathroom;Assistance with cooking/housework;Assist for transportation    Equipment Recommendations None recommended by PT  Recommendations for Other Services       Functional Status Assessment Patient has had a recent decline in their functional status and demonstrates the ability to make significant improvements in function in a  reasonable and predictable amount of time.     Precautions / Restrictions Precautions Precautions: Anterior Hip;Fall Precaution Booklet Issued: Yes (comment) Restrictions Weight Bearing Restrictions: Yes RLE Weight Bearing: Weight bearing as tolerated      Mobility  Bed Mobility Overal bed mobility: Modified Independent             General bed mobility comments: Min extra time and effort only    Transfers Overall transfer level: Needs assistance Equipment used: Rolling walker (2 wheels) Transfers: Sit to/from Stand Sit to Stand: Min guard           General transfer comment: Min to mod verbal and tactile cues for sequencing most notably for hand placement    Ambulation/Gait Ambulation/Gait assistance: Min guard Gait Distance (Feet): 8 Feet Assistive device: Rolling walker (2 wheels) Gait Pattern/deviations: Step-to pattern, Decreased stance time - right, Decreased step length - left Gait velocity: decreased     General Gait Details: Step-to pattern with cues for sequencing for general safety with the RW but pt steady with no overt LOB or buckling  Stairs            Wheelchair Mobility    Modified Rankin (Stroke Patients Only)       Balance Overall balance assessment: Needs assistance   Sitting balance-Leahy Scale: Normal     Standing balance support: Bilateral upper extremity supported, During functional activity Standing balance-Leahy Scale: Good                               Pertinent Vitals/Pain Pain Assessment Pain Assessment: 0-10 Pain Score: 2  Pain Location: R hip Pain Descriptors / Indicators: Sore Pain Intervention(s): Repositioned, Premedicated before session, Monitored during session    Home Living Family/patient expects to be discharged to:: Private residence Living Arrangements: Spouse/significant other Available Help at Discharge: Family;Available 24 hours/day Type of Home: House Home Access: Level entry        Home Layout: One level Home Equipment: Cane - single point;Shower seat;Rolling Environmental consultant (2 wheels)      Prior Function Prior Level of Function : Independent/Modified Independent             Mobility Comments: Ind amb limited community distances more recently without an AD, activity tolerance limited by R hip pain with walking, one fall in the last year while playing pickleball leading to compression fracture and kyphoplasty ADLs Comments: Ind with ADLs     Hand Dominance        Extremity/Trunk Assessment   Upper Extremity Assessment Upper Extremity Assessment: Generalized weakness;RUE deficits/detail RUE Deficits / Details: BLE ankle AROM, strength, and sensation to light touch grossly WNL RUE: Unable to fully assess due to pain RUE Sensation: WNL RUE Coordination: WNL    Lower Extremity Assessment Lower Extremity Assessment: Overall WFL for tasks assessed       Communication   Communication: No difficulties  Cognition Arousal/Alertness: Awake/alert Behavior During Therapy: WFL for tasks assessed/performed Overall Cognitive Status: Within Functional Limits for tasks assessed                                          General Comments      Exercises Total Joint Exercises Ankle Circles/Pumps: AROM, Strengthening, Both, 10 reps Long Arc Quad: AROM, Strengthening, Both, 10 reps Knee Flexion: AROM, Strengthening, Both, 10 reps Marching in Standing: Strengthening, Both, 5 reps, Standing Other Exercises Other Exercises: Anterior hip precaution education Other Exercises: HEP education/review per handout   Assessment/Plan    PT Assessment Patient needs continued PT services  PT Problem List Decreased strength;Decreased activity tolerance;Decreased balance;Decreased mobility;Decreased knowledge of use of DME;Decreased safety awareness;Decreased knowledge of precautions;Pain       PT Treatment Interventions DME instruction;Gait  training;Functional mobility training;Therapeutic activities;Therapeutic exercise;Balance training;Patient/family education    PT Goals (Current goals can be found in the Care Plan section)  Acute Rehab PT Goals Patient Stated Goal: To get back to walking better PT Goal Formulation: With patient Time For Goal Achievement: 08/25/22 Potential to Achieve Goals: Good    Frequency BID     Co-evaluation               AM-PAC PT "6 Clicks" Mobility  Outcome Measure Help needed turning from your back to your side while in a flat bed without using bedrails?: A Little Help needed moving from lying on your back to sitting on the side of a flat bed without using bedrails?: A Little Help needed moving to and from a bed to a chair (including a wheelchair)?: A Little Help needed standing up from a chair using your arms (e.g., wheelchair or bedside chair)?: A Little Help needed to walk in hospital room?: A Little Help needed climbing 3-5 steps with a railing? : A Lot 6 Click Score: 17    End of Session Equipment Utilized During Treatment: Gait belt Activity Tolerance: Patient tolerated treatment well Patient left: in chair;with nursing/sitter in room;with call bell/phone within reach Nurse Communication: Mobility status;Weight bearing status PT Visit Diagnosis: Other  abnormalities of gait and mobility (R26.89);Muscle weakness (generalized) (M62.81);Pain Pain - Right/Left: Right Pain - part of body: Hip    Time: [id number] PT Time Calculation (min) (ACUTE ONLY): 39 min   Charges:   PT Evaluation $PT Eval Moderate Complexity: 1 Mod PT Treatments $Therapeutic Activity: 8-22 mins       D. Royetta Asal PT, DPT 08/12/22, 5:13 PM

## 2022-08-12 NOTE — Anesthesia Preprocedure Evaluation (Signed)
Anesthesia Evaluation  Patient identified by MRN, date of birth, ID band Patient awake    Reviewed: Allergy & Precautions, NPO status , Patient's Chart, lab work & pertinent test results  History of Anesthesia Complications Negative for: history of anesthetic complications  Airway Mallampati: III  TM Distance: >3 FB Neck ROM: full    Dental  (+) Dental Advidsory Given   Pulmonary neg pulmonary ROS, neg shortness of breath, neg COPD, former smoker   Pulmonary exam normal        Cardiovascular hypertension, Normal cardiovascular exam+ dysrhythmias   Newly diagnosed LBBB. Patient was seen and cleared by her cardiologist. Patient states it does not effect her daily life. Denies chest pain, shortness of breath.    Neuro/Psych  PSYCHIATRIC DISORDERS      negative neurological ROS     GI/Hepatic negative GI ROS, Neg liver ROS,,,  Endo/Other  negative endocrine ROS    Renal/GU negative Renal ROS  negative genitourinary   Musculoskeletal   Abdominal   Peds  Hematology negative hematology ROS (+)   Anesthesia Other Findings Past Medical History: No date: Arthritis No date: DDD (degenerative disc disease), lumbar No date: HLD (hyperlipidemia) No date: Hypertension 07/31/2022: New onset left bundle branch block (LBBB)     Comment:  a.) noted on preoperative ECG; (+) associated               retrosternal chest pressured for ~ 10 days.  Past Surgical History: No date: ABDOMINAL HYSTERECTOMY 1995: bladder lift No date: COLONOSCOPY 2005: EYE SURGERY     Comment:  lasix 04/09/2022: IR KYPHO LUMBAR INC FX REDUCE BONE BX UNI/BIL CANNULATION  INC/IMAGING 04/02/2022: IR RADIOLOGIST EVAL & MGMT 04/19/2022: IR RADIOLOGIST EVAL & MGMT No date: LASER ABLATION; Right No date: TONSILLECTOMY AND ADENOIDECTOMY     Comment:  as a child No date: TUBAL LIGATION  BMI    Body Mass Index: 23.96 kg/m       Reproductive/Obstetrics negative OB ROS                             Anesthesia Physical Anesthesia Plan  ASA: 2  Anesthesia Plan: General ETT   Post-op Pain Management:    Induction: Intravenous  PONV Risk Score and Plan: 3 and Midazolam, TIVA, Ondansetron, Dexamethasone and Propofol infusion  Airway Management Planned: Nasal Cannula and Natural Airway  Additional Equipment:   Intra-op Plan:   Post-operative Plan: Extubation in OR  Informed Consent: I have reviewed the patients History and Physical, chart, labs and discussed the procedure including the risks, benefits and alternatives for the proposed anesthesia with the patient or authorized representative who has indicated his/her understanding and acceptance.     Dental Advisory Given  Plan Discussed with: Anesthesiologist, CRNA and Surgeon  Anesthesia Plan Comments: (Patient reports no bleeding problems and no anticoagulant use.  Plan for spinal with backup GA  Patient consented for risks of anesthesia including but not limited to:  - adverse reactions to medications - damage to eyes, teeth, lips or other oral mucosa - nerve damage due to positioning  - risk of bleeding, infection and or nerve damage from spinal that could lead to paralysis - risk of headache or failed spinal - damage to teeth, lips or other oral mucosa - sore throat or hoarseness - damage to heart, brain, nerves, lungs, other parts of body or loss of life  Patient voiced understanding.)  Anesthesia Quick Evaluation  

## 2022-08-12 NOTE — Transfer of Care (Signed)
Immediate Anesthesia Transfer of Care Note  Patient: Debbie Montgomery  Procedure(s) Performed: TOTAL HIP ARTHROPLASTY ANTERIOR APPROACH (Right: Hip)  Patient Location: PACU  Anesthesia Type:General  Level of Consciousness: awake, alert , and oriented  Airway & Oxygen Therapy: Patient Spontanous Breathing  Post-op Assessment: Report given to RN and Post -op Vital signs reviewed and stable  Post vital signs: Reviewed and stable  Last Vitals:  Vitals Value Taken Time  BP 105/55 08/12/22 1322  Temp 36.6 C 08/12/22 1320  Pulse 72 08/12/22 1327  Resp 20 08/12/22 1327  SpO2 94 % 08/12/22 1327  Vitals shown include unvalidated device data.  Last Pain:  Vitals:   08/12/22 1320  TempSrc:   PainSc: 0-No pain         Complications: No notable events documented.

## 2022-08-12 NOTE — H&P (Signed)
Chief Complaint Patient presents with Pre-op Exam Right direct anterior THA - 08/12/22 By Dr. Lillia Abed Debbie Montgomery is a 77 y.o. female who presents today for history and physical for right anterior total hip arthroplasty with Dr. Karel Jarvis on 08/12/2022. Patient has x-rays of her right hip showing advanced degenerative changes with large subchondral cyst formation on both sides of the joint. Pain has been present for nearly 6 months. Patient received a right hip intra-articular injection back in November 2023 along with some physical therapy which only provided her with about 3 weeks worth of relief. Patient states with the injection she had 100% relief of her pain. Her pain is located in her right groin, right lateral hip and right posterior buttocks. Patient feels a grinding/catching sensation within the groin. Her symptoms are worse with all activities of daily living and has affected her quality of life and activities daily living. Patient enjoys playing pickle ball and is limited by her hip pain. She is taking Excedrin and hydrocodone occasionally for pain.  Past Medical History: Past Medical History: Diagnosis Date Anxiety, generalized 10/03/2020 Ectopic pregnancy, tubal Essential hypertension 06/27/2020 Family history of melanoma 12/16/2019 Formatting of this note might be different from the original. Melanoma in mother Formatting of this note might be different from the original. Formatting of this note might be different from the original. 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  Past Surgical History: Past Surgical History: Procedure Laterality Date BLADDER SURGERY [id number] HYSTERECTOMY VAGINAL TUBAL LIGATION 1986  Past Family  History: Family History Problem Relation Age of Onset High blood pressure (Hypertension) Mother Myocardial Infarction (Heart attack) Father Colon cancer Neg Hx  Medications: Current Outpatient Medications Ordered in Epic Medication Sig Dispense Refill alendronate (FOSAMAX) 70 MG tablet Take 1 tablet (70 mg total) by mouth every 7 (seven) days. Take with a full glass of water on an empty stomach. atorvastatin (LIPITOR) 10 MG tablet Take 1 tablet by mouth once daily calcium carbonate-vitamin D3 (CALTRATE 600+D) 600 mg-10 mcg (400 unit) tablet Take 1 tablet by mouth once daily chrom pico/brindal berry (GARCINIA CAMBOGIA ORAL) Take by mouth once daily estradioL (ESTRACE) 0.01 % (0.1 mg/gram) vaginal cream NIGHTLY FOR 2 WEEKS THEN TWICE WEEKLY fluoxetine HCl (FLUOXETINE ORAL) Take 20 mg by mouth once daily hydroCHLOROthiazide (MICROZIDE) 12.5 mg capsule Take 12.5 mg by mouth once daily HYDROcodone-acetaminophen (NORCO) 5-325 mg tablet Take 1 tablet by mouth 2 (two) times daily as needed 10 tablet 0 sodium, potassium, and magnesium (SUPREP) oral solution Take 1 Bottle by mouth as directed One kit contains 2 bottles. Take both bottles at the times instructed by your provider. 354 mL 0 tacrolimus (PROGRAF) 1 MG capsule tretinoin (RETIN-A) 0.05 % cream APPLY TO DRY FACE NIGHTLY UNABLE TO FIND Med Name: Swiss Kriss laxative nightly valACYclovir (VALTREX) 1000 MG tablet Take 1,000 mg by mouth 2 (two) times daily  No current Epic-ordered facility-administered medications on file.  Allergies: No Known Allergies  Review of Systems: A comprehensive 14 point ROS was performed, reviewed by me today, and the pertinent orthopaedic findings are documented in the HPI.  Exam: There were no vitals taken for this visit. General: Well developed, well nourished, no apparent distress, normal affect, normal gait with no antalgic component.  HEENT: Head normocephalic, atraumatic, PERRL.  Abdomen: Soft,  non  tender, non distended, Bowel sounds present.  Heart: Examination of the heart reveals regular, rate, and rhythm. There is no murmur noted on ascultation. There is a normal apical pulse.  Lungs: Lungs are clear to auscultation. There is no wheeze, rhonchi, or crackles. There is normal expansion of bilateral chest walls.  Right hip exam  SKIN: intact SWELLING: none WARMTH: no warmth TENDERNESS: Small amount of posterior lateral tenderness to palpation, Stinchfield Positive with recreation of pain in the groin ROM: 10 degrees internal rotation and 30 degrees external rotation and pain with internal rotation,; Hip Flexion 120 STRENGTH: normal GAIT: stiff-legged STABILITY: stable to testing CREPITUS: Yes LEG LENGTH DISCREPANCY: left longer by .5 cm NEUROLOGICAL EXAM: normal VASCULAR EXAM: normal LUMBAR SPINE: tenderness: no straight leg raising sign: no motor exam: normal  The contralateral hip was examined for comparison and it showed: TENDERNESS: none ROM: normal and full STRENGTH: normal STABILITY: stable to testing  Hip Imaging : 2 view x-rays of the AP pelvis and lateral of the right hip reviewed by me from 04/16/2022. There is right hip degenerative changes with joint space narrowing, sclerosis, osteophyte formation, and subchondral cyst formation. There is additional chronic changes over the insertion of the abductor musculature around the greater trochanter. As well as degenerative changes noted at other insertions around the pelvis with osteophyte formation diffusely and degenerative changes of the lumbosacral spine. No fractures or dislocations noted.  AP pelvis, lateral right hip and lateral sit/stand x-rays ordered interpreted by me in office today. Impression: Patient has degenerative changes on the lower lumbar spine with L4-L5 disc base degeneration and mild anterolisthesis of L4 on L5 with mild anterolisthesis of L5 on S1. Lower lumbar facet arthritis present. Severe  sclerotic changes along the superior femoral head with mild deformity, subchondral cyst formation noted along the superior acetabulum and superior femoral head. Large superior acetabular spur noted. No evidence of acute bony abnormality or abnormal bony lesions  Impression: Osteoarthritis of right hip, unspecified osteoarthritis type [M16.11] Osteoarthritis of right hip, unspecified osteoarthritis type (primary encounter diagnosis)  Plan: 2. 77 year old female with advanced right hip degenerative changes. She has severe pain within her right hip consistent with right hip osteoarthritis that is affecting her quality of life and activities daily living. She has had no relief with conservative treatment consisting of injections, PT, anti-inflammatory medications. Risks, benefits, complications of a right anterior total hip arthroplasty have been discussed with the patient. Patient has agreed and consented procedure with Dr. Karel Jarvis on 08/12/2022  The hospitalization and post-operative care and rehabilitation were also discussed. The use of perioperative antibiotics and DVT prophylaxis were discussed. The risk, benefits and alternatives to a surgical intervention were discussed at length with the patient. The patient was also advised of risks related to the medical comorbidities and elevated body mass index (BMI). A lengthy discussion took place to review the most common complications including but not limited to: deep vein thrombosis, pulmonary embolus, heart attack, stroke, infection, wound breakdown, dislocation, numbness, leg length in-equality, intraoperative fracture, damage to nerves, tendon,muscles, arteries or other blood vessels, death and other possible complications from anesthesia. The patient was told that we will take steps to minimize these risks by using sterile technique, antibiotics and DVT prophylaxis when appropriate and follow the patient postoperatively in the office setting to monitor  progress. The possibility of recurrent pain, no improvement in pain and actual worsening of pain were also discussed with the patient. The risk of dislocation following total hip replacement was discussed and  potential precautions to prevent dislocation were reviewed.  This note was generated in part with voice recognition software and I apologize for any typographical errors that were not detected and corrected.

## 2022-08-12 NOTE — Anesthesia Procedure Notes (Addendum)
Spinal  Patient location during procedure: OR Start time: 08/12/2022 10:44 AM End time: 08/12/2022 10:52 AM Reason for block: surgical anesthesia Staffing Performed: resident/CRNA  Anesthesiologist: Dimas Millin, MD Resident/CRNA: Diogo Anne, [place], CRNA Performed by: Darden Flemister, [place], CRNA Authorized by: Dimas Millin, MD   Preanesthetic Checklist Completed: patient identified, IV checked, site marked, risks and benefits discussed, surgical consent, monitors and equipment checked, pre-op evaluation and timeout performed Spinal Block Patient position: sitting Prep: Betadine Patient monitoring: heart rate, continuous pulse ox, blood pressure and cardiac monitor Approach: midline Location: L4-5 Injection technique: single-shot Needle Needle type: Whitacre and Introducer  Needle gauge: 24 G Needle length: 9 cm Assessment Sensory level: T6 Events: CSF return Additional Notes Negative paresthesia. Negative blood return. Positive free-flowing CSF. Expiration date of kit checked and confirmed. Patient tolerated procedure well, without complications.

## 2022-08-12 NOTE — Op Note (Addendum)
Patient Name: Debbie Montgomery  [id number]  Pre-Operative Diagnosis: Right hip Osteoarthritis  Post-Operative Diagnosis: (same)  Procedure: Right Total Hip Arthroplasty  Components/Implants: Cup: Trident II Tritanium Clusterhole 26m  w/ x2 screws  Liner: MDM 352mD  Stem: Insignia #5 Std offset  Head:X3 MDM 3853m/LFIT V40 22.2mm65mmm 36md  Date of Surgery: 08/12/2022  Surgeon: ZachaSteffanie RainwaterAssistant: ThomaDorise Hisspresent and scrubbed throughout the case, critical for assistance with exposure, retraction, instrumentation, and closure)   Anesthesiologist: WoodsBarbra Sarkssthesia: Spinal   EBL: 300cc  IVF:[id number]: None   Brief history: The patient is a 76 ye59 old female with a history of osteoarthritis of the right hip with pain limiting their range of motion and activities of daily living, which has failed multiple attempts at conservative therapy.  The risks and benefits of total hip arthroplasty as definitive surgical treatment were discussed with the patient, who opted to proceed with the operation.  After outpatient medical clearance and optimization was completed [facility]  All preoperative films were reviewed and an appropriate surgical plan was made prior to surgery.   Description of procedure: The patient was brought to the operating room where laterality was confirmed by all those present to be the right side.  The patient was administered spinal anesthesia on a stretcher prior to being moved supine on the operating room table. Patient was given an intravenous dose of antibiotics for surgical prophylaxis and TXA.  All bony prominences and extremities were well padded and the patient was securely attached to the table boots, a perineal post was placed and the patient had a safety strap placed.  Surgical site was prepped with alcohol and chlorhexidine. The surgical site over the hip  was and draped in typical sterile fashion with multiple layers of adhesive and nonadhesive drapes.  The incision site was marked out with a sterile marker and care was taken to assess the position of the ASIS and ensure appropriate position for the incision.    A surgical timeout was then called with participation of all staff in the room the patient was then a confirmed again and laterality confirmed.  Incision was made over the anterior lateral aspect of the proximal thigh in line with the TFL.  Appropriate retractors were placed and all bleeding vessels were coagulated within the subcutaneous and fatty layers.  An incision was made in the TFL fascia in the interval was carefully identified.  The lateral ascending branches of the circumflex vessels were identified, cauterized and carefully dissected.  Retractors were placed around the superior lateral and inferior medial aspects of the femoral neck and a capsulotomy was performed exposing the hip joint.  Retraction stitches were placed and the capsulotomy to assist with visualization.  Femoral neck cut was then made and the femoral head was extracted after placing the leg in traction.  Bone wax was then applied to the proximal cut surface of the femur and aqua mantis was used to address any bleeding around the femoral neck cut.  Retractors were then placed around the acetabulum to fully visualize the joint space, and the remaining labral tissue was removed and pulvinar was removed.   The acetabulum was then sequentially reamed up to the appropriate size in order to get good fit and fill for the acetabular component while under fluoroscopic guidance.  Acetabular component was then placed and malleted into a secure fit while confirming position and abduction angle and  anteversion utilizing fluoroscopy.  2 screws were then placed in the acetabular cup to assist in securing the cup in place. The cup was then irrigated and cleared for insertion of the real MDM  liner which was then impacted into place and checked for stability.  The femur traction was dropped and sequentially externally rotated while performing a release of the posterior and superomedial tissues off of the proximal femur to allow for mobility, care was taken to preserve the external rotators and piriformis attachments.  The remaining interval between the abductors and the capsule was dissected out and a retractor was placed over the superolateral aspect of the femur over the greater trochanter.  The leg was carefully brought down into extension and adducted to provide visualization of the proximal femur for broaching.  The femur was then sequentially broached up to an appropriate size which provided for good fill and stability to the femoral broach.  A trial neck and head were placed on the femoral broach and the leg was brought up for reduction.  The hip was reduced and manual check of stability was performed with the boot detached from the table.  The hip was found to be stable in flexion internal rotation and extension external rotation.  Leg lengths were confirmed on fluoroscopy.   The hip was then dislocated the trial neck and head were removed.  The leg was then brought down into extension and adduction in the proximal femur was reexposed.  The broach trial was removed and the femur was irrigated with normal saline prior to the real femoral stem being implanted.  After the femoral stem was seated and shown to have good fit and fill the appropriate head was impacted the leg was brought up and reduced.  There was good range of motion with stability in flexion internal rotation and extension external rotation on testing.  Leg lengths were found to be appropriate on fluoroscopic evaluation at this time.  The hip was then irrigated with Irrisept solution and then saline solution.  The capsulotomy was repaired with Ethibond sutures. Surgiflo was then applied to the edges of the femoral neck to help  prevent any further bleeding.  A pericapsular and peritrochanteric cocktail with Exparel and bupivacaine was then injected as well as the subcutaneous tissues. The fascia was closed with a #2 barbed running suture.  The deep tissues were closed with Vicryl sutures the subcutaneous tissues were closed with interrupted Vicryl sutures and a running barbed 3-0 suture.  The skin was then reinforced with Dermabond and a sterile dressing was placed.   The patient was awoken from anesthesia transferred off of the operating room table onto a hospital bed where examination of leg lengths found the leg lengths to be equal with a good distal pulse.  The patient was then transferred to the PACU in stable condition.

## 2022-08-12 NOTE — Progress Notes (Signed)
Patient complains of left eye irritation.  Anesthesiologist informed and advises patient to visit ophthalmologist if irritation is not better in 2-3 days.  Anesthesiologist advises that eye irritations are typically self-resolving.  Patient nods positively, to agree.

## 2022-08-12 NOTE — Interval H&P Note (Signed)
Patient history and physical updated. Consent reviewed including risks, benefits, and alternatives to surgery. Patient agrees with above plan to proceed with right anterior total hip arthroplasty.   

## 2022-08-13 ENCOUNTER — Encounter: Payer: Self-pay | Admitting: Orthopedic Surgery

## 2022-08-13 DIAGNOSIS — M1611 Unilateral primary osteoarthritis, right hip: Secondary | ICD-10-CM | POA: Diagnosis not present

## 2022-08-13 LAB — CBC
HCT: 29.7 % — ABNORMAL LOW (ref 36.0–46.0)
Hemoglobin: 10 g/dL — ABNORMAL LOW (ref 12.0–15.0)
MCH: 32.1 pg (ref 26.0–34.0)
MCHC: 33.7 g/dL (ref 30.0–36.0)
MCV: 95.2 fL (ref 80.0–100.0)
Platelets: 191 10*3/uL (ref 150–400)
RBC: 3.12 MIL/uL — ABNORMAL LOW (ref 3.87–5.11)
RDW: 12.1 % (ref 11.5–15.5)
WBC: 10 10*3/uL (ref 4.0–10.5)
nRBC: 0 % (ref 0.0–0.2)

## 2022-08-13 LAB — BASIC METABOLIC PANEL
Anion gap: 3 — ABNORMAL LOW (ref 5–15)
BUN: 16 mg/dL (ref 8–23)
CO2: 28 mmol/L (ref 22–32)
Calcium: 8 mg/dL — ABNORMAL LOW (ref 8.9–10.3)
Chloride: 103 mmol/L (ref 98–111)
Creatinine, Ser: 0.52 mg/dL (ref 0.44–1.00)
GFR, Estimated: >60 mL/min (ref >60)
Glucose, Bld: 127 mg/dL — ABNORMAL HIGH (ref 70–99)
Potassium: 3.4 mmol/L — ABNORMAL LOW (ref 3.5–5.1)
Sodium: 134 mmol/L — ABNORMAL LOW (ref 135–145)

## 2022-08-13 MED ORDER — FLUOXETINE HCL 20 MG PO CAPS
ORAL_CAPSULE | ORAL | Status: AC
  Administered 2022-08-13: 20 mg via ORAL
  Filled 2022-08-13: qty 1

## 2022-08-13 MED ORDER — SENNA 8.6 MG PO TABS
ORAL_TABLET | ORAL | Status: AC
  Administered 2022-08-13: 8.6 mg via ORAL
  Filled 2022-08-13: qty 1

## 2022-08-13 MED ORDER — ONDANSETRON HCL 4 MG PO TABS: 4.0000 mg | ORAL_TABLET | Freq: Four times a day (QID) | ORAL | 0 refills | Status: DC | PRN

## 2022-08-13 MED ORDER — ACETAMINOPHEN 500 MG PO TABS: 1000.0000 mg | ORAL_TABLET | Freq: Three times a day (TID) | ORAL | 0 refills | Status: DC

## 2022-08-13 MED ORDER — ENOXAPARIN SODIUM 40 MG/0.4ML IJ SOSY
PREFILLED_SYRINGE | INTRAMUSCULAR | Status: AC
  Administered 2022-08-13: 40 mg via SUBCUTANEOUS
  Filled 2022-08-13: qty 0.4

## 2022-08-13 MED ORDER — PANTOPRAZOLE SODIUM 40 MG PO TBEC
DELAYED_RELEASE_TABLET | ORAL | Status: AC
  Administered 2022-08-13: 40 mg via ORAL
  Filled 2022-08-13: qty 1

## 2022-08-13 MED ORDER — ENOXAPARIN SODIUM 40 MG/0.4ML IJ SOSY: 40.0000 mg | PREFILLED_SYRINGE | INTRAMUSCULAR | 0 refills | Status: DC

## 2022-08-13 MED ORDER — CELECOXIB 200 MG PO CAPS: 200.0000 mg | ORAL_CAPSULE | Freq: Two times a day (BID) | ORAL | 0 refills | Status: AC

## 2022-08-13 MED ORDER — DOCUSATE SODIUM 100 MG PO CAPS
ORAL_CAPSULE | ORAL | Status: AC
  Administered 2022-08-13: 100 mg via ORAL
  Filled 2022-08-13: qty 1

## 2022-08-13 MED ORDER — TRAMADOL HCL 50 MG PO TABS: 50.0000 mg | ORAL_TABLET | Freq: Four times a day (QID) | ORAL | 0 refills | Status: DC | PRN

## 2022-08-13 MED ORDER — HYDROCODONE-ACETAMINOPHEN 5-325 MG PO TABS
ORAL_TABLET | ORAL | Status: AC
  Administered 2022-08-13: 1 via ORAL
  Filled 2022-08-13: qty 1

## 2022-08-13 MED ORDER — HYDROCODONE-ACETAMINOPHEN 5-325 MG PO TABS: 0.5000 | ORAL_TABLET | Freq: Three times a day (TID) | ORAL | 0 refills | Status: DC | PRN

## 2022-08-13 MED ORDER — HYDROCHLOROTHIAZIDE 25 MG PO TABS: 12.5000 mg | ORAL_TABLET | Freq: Every day | ORAL | Status: DC

## 2022-08-13 MED ORDER — ATORVASTATIN CALCIUM 20 MG PO TABS
ORAL_TABLET | ORAL | Status: AC
  Administered 2022-08-13: 10 mg via ORAL
  Filled 2022-08-13: qty 1

## 2022-08-13 MED ORDER — KETOROLAC TROMETHAMINE 15 MG/ML IJ SOLN
INTRAMUSCULAR | Status: AC
  Administered 2022-08-13: 7.5 mg via INTRAVENOUS
  Filled 2022-08-13: qty 1

## 2022-08-13 MED ORDER — ACETAMINOPHEN 500 MG PO TABS
ORAL_TABLET | ORAL | Status: AC
  Filled 2022-08-13: qty 2

## 2022-08-13 MED ORDER — POTASSIUM CHLORIDE 20 MEQ PO PACK
20.0000 meq | PACK | Freq: Once | ORAL | Status: AC
  Administered 2022-08-13: 20 meq via ORAL
  Filled 2022-08-13 (×2): qty 1

## 2022-08-13 MED ORDER — HYDROCHLOROTHIAZIDE 25 MG PO TABS
ORAL_TABLET | ORAL | Status: AC
  Administered 2022-08-13: 12.5 mg via ORAL
  Filled 2022-08-13: qty 1

## 2022-08-13 MED ORDER — SODIUM CHLORIDE 0.9 % IV BOLUS
500.0000 mL | Freq: Once | INTRAVENOUS | Status: AC
  Administered 2022-08-13: 500 mL via INTRAVENOUS

## 2022-08-13 NOTE — Progress Notes (Signed)
Nsg Discharge Note  Admit Date:  08/12/2022 Discharge date: 08/13/2022   Valeta Harms [place] to be D/C'd Home per MD order.  AVS completed.  Copy for chart, and copy for patient signed, and dated. Patient/caregiver able to verbalize understanding.  Discharge Assessment: Vitals:   08/13/22 0500 08/13/22 0932  BP: (!) 103/52 120/66  Pulse: 63 (!) 58  Resp: 16 16  Temp:  97.9 F (36.6 C)  SpO2: 94% 98%   Skin clean, dry and intact without evidence of skin break down, no evidence of skin tears noted. IV catheter discontinued intact. Site without signs and symptoms of complications - no redness or edema noted at insertion site, patient denies c/o pain - only slight tenderness at site.  Dressing with slight pressure applied.  D/c Instructions-Education: Discharge instructions given to patient/family with verbalized understanding. D/c education completed with patient/family including follow up instructions, medication list, d/c activities limitations if indicated, with other d/c instructions as indicated by MD - patient able to verbalize understanding, all questions fully answered. Patient instructed to return to ED, call 911, or call MD for any changes in condition.  Patient escorted via [place], and D/C home via private auto.  Tresa Endo, RN 08/13/2022 11:32 AM

## 2022-08-13 NOTE — Discharge Instructions (Signed)
Instructions after Anterior Total Hip Replacement        Dr. Zachary Aberman, Jr., M.D.      Dept. of Orthopaedics & Sports Medicine  [facility]  [address]  [place], [place]  [address]  Phone: [phone]   Fax: [phone]    DIET: Drink plenty of non-alcoholic fluids. Resume your normal diet. Include foods high in fiber.  ACTIVITY:  You may use crutches or a walker with weight-bearing as tolerated, unless instructed otherwise. You may be weaned off of the walker or crutches by your Physical Therapist.  Continue doing gentle exercises. Exercising will reduce the pain and swelling, increase motion, and prevent muscle weakness.   Please continue to use the TED compression stockings for 2 weeks. You may remove the stockings at night, but should reapply them in the morning. Do not drive or operate any equipment until instructed.  WOUND CARE:  Continue to use ice packs periodically to reduce pain and swelling. You may shower with honeycomb dressing 3 days after your surgery. Do not submerge incision site under water. Remove honeycomb dressing 7 days after surgery and allow dermabond to fall off on its own.   MEDICATIONS: You may resume your regular medications. Please take the pain medication as prescribed on the medication list. Do not take pain medication on an empty stomach. You have been given a prescription for a blood thinner to prevent blood clots. Please take the medication as instructed. (NOTE: After completing a 2 week course of Lovenox, take one Enteric-coated 81 mg aspirin twice a day.) Pain medications and iron supplements can cause constipation. Use a stool softener (Senokot or Colace) on a daily basis and a laxative (dulcolax or miralax) as needed. Do not drive or drink alcoholic beverages when taking pain medications.  POSTOPERATIVE CONSTIPATION PROTOCOL Constipation - defined medically as fewer than three stools per week and severe constipation as  less than one stool per week.  One of the most common issues patients have following surgery is constipation.  Even if you have a regular bowel pattern at home, your normal regimen is likely to be disrupted due to multiple reasons following surgery.  Combination of anesthesia, postoperative narcotics, change in appetite and fluid intake all can affect your bowels.  In order to avoid complications following surgery, here are some recommendations in order to help you during your recovery period.  Colace (docusate) - Pick up an over-the-counter form of Colace or another stool softener and take twice a day as long as you are requiring postoperative pain medications.  Take with a full glass of water daily.  If you experience loose stools or diarrhea, hold the colace until you stool forms back up.  If your symptoms do not get better within 1 week or if they get worse, check with your doctor.  Dulcolax (bisacodyl) - Pick up over-the-counter and take as directed by the product packaging as needed to assist with the movement of your bowels.  Take with a full glass of water.  Use this product as needed if not relieved by Colace only.   MiraLax (polyethylene glycol) - Pick up over-the-counter to have on hand.  MiraLax is a solution that will increase the amount of water in your bowels to assist with bowel movements.  Take as directed and can mix with a glass of water, juice, soda, coffee, or tea.  Take if you go more than two days without a movement. Do not use MiraLax more than once per day. Call   your doctor if you are still constipated or irregular after using this medication for 7 days in a row.  If you continue to have problems with postoperative constipation, please contact the office for further assistance and recommendations.  If you experience "the worst abdominal pain ever" or develop nausea or vomiting, please contact the office immediatly for further recommendations for treatment.   CALL THE OFFICE  FOR: Temperature above 101 degrees Excessive bleeding or drainage on the dressing. Excessive swelling, coldness, or paleness of the toes. Persistent nausea and vomiting.  FOLLOW-UP:  You should have an appointment to return to the office in 2 weeks after surgery. Arrangements have been made for continuation of Physical Therapy (either home therapy or outpatient therapy).  

## 2022-08-13 NOTE — TOC Progression Note (Signed)
Transition of Care [facility]) - Progression Note    Patient Details  Name: Debbie Montgomery MRN: [id number] Date of Birth: 09-17-1945  [facility] CM/SW [place], RN Phone Number: 08/13/2022, 8:40 AM  Clinical Narrative:   The patient is from home with her spouse She is set up with [place] for [facility] services. She has  [place] - single point;[place] (2 wheels)  at home    Expected Discharge Plan: [place] Barriers to Discharge: No Barriers Identified  Expected Discharge Plan and Services   Discharge Planning Services: CM Consult   Living arrangements for the past 2 months: Single Family Home Expected Discharge Date: 08/13/22                         HH Arranged: PT HH Agency: [place] Date [place]: 08/13/22 Time [place]: [phone] Representative spoke with at [place]: [place]   Social Determinants of Health ([place]) Interventions SDOH Screenings   Food Insecurity: No Food Insecurity (08/12/2022)  Housing: Low Risk  (08/12/2022)  Transportation Needs: No Transportation Needs (08/12/2022)  Utilities: Not At Risk (08/12/2022)  Depression (PHQ2-9): Low Risk  (07/09/2022)  Financial Resource Strain: Low Risk  (03/20/2020)  Physical Activity: Sufficiently Active (03/20/2020)  Social Connections: Unknown (03/20/2020)  Stress: No Stress Concern Present (03/20/2020)  Tobacco Use: Medium Risk (08/12/2022)    Readmission Risk Interventions     No data to display

## 2022-08-13 NOTE — Telephone Encounter (Signed)
Requested medication (s) are due for refill today:   Yes  Requested medication (s) are on the active medication list:   Yes  Future visit scheduled:   Yes   Last ordered: 05/28/2022 #12, 0 refills  Returned because a vitamin D, magnesium and phosphorus are due per protocol   Requested Prescriptions  Pending Prescriptions Disp Refills   alendronate (FOSAMAX) 70 MG tablet [Pharmacy Med Name: ALENDRONATE SODIUM 70 MG TAB] 12 tablet 0    Sig: TAKE 1 TABLET (70 MG TOTAL) BY MOUTH EVERY 7 DAYS WITH FULL GLASS WATER ON EMPTY STOMACH     Endocrinology:  Bisphosphonates Failed - 08/12/2022  8:59 AM      Failed - Vitamin D in normal range and within 360 days    No results found for: "[id number]", "IA:[id number]", "VD125OH2TOT", "25OHVITD3", "25OHVITD2", "25OHVITD1", "VD25OH"       Failed - Mg Level in normal range and within 360 days    No results found for: "MG"       Failed - Phosphate in normal range and within 360 days    Phosphorus  Date Value Ref Range Status  05/10/2021 4.3 3.0 - 4.3 mg/dL Final         Passed - Ca in normal range and within 360 days    Calcium  Date Value Ref Range Status  08/13/2022 8.0 (L) 8.9 - 10.3 mg/dL Final         Passed - Cr in normal range and within 360 days    Creat  Date Value Ref Range Status  04/02/2022 0.47 (L) 0.60 - 1.00 mg/dL Final   Creatinine, Ser  Date Value Ref Range Status  08/13/2022 0.52 0.44 - 1.00 mg/dL Final         Passed - eGFR is 30 or above and within 360 days    GFR calc Af Amer  Date Value Ref Range Status  12/17/2019 103 >59 mL/min/1.73 Final    Comment:    **[facility] currently reports eGFR in compliance with the current**   recommendations of the [employer]. [facility] will   update reporting as new guidelines are published from the NKF-ASN   Task force.    GFR, Estimated  Date Value Ref Range Status  08/13/2022 >60 >60 mL/min Final    Comment:    (NOTE) Calculated using the CKD-EPI Creatinine  Equation (2021)    eGFR  Date Value Ref Range Status  04/02/2022 100 > OR = 60 mL/min/1.34m Final  05/10/2021 94 >59 mL/min/1.73 Final         Passed - Valid encounter within last 12 months    Recent Outpatient Visits           1 month ago Localized osteoporosis without current pathological fracture   [facility] [facility] [place]   3 months ago [place] [place] [facility] [facility] [place]   6 months ago Compression fracture of L1 vertebra with routine healing, [facility] [place] [facility] [place]   9 [place]   1 [place] [facility] [facility] [place]       Future Appointments  In 2 months Juline Patch, MD [facility] at [facility] [place] Density or Dexa Scan completed in the last 2 years

## 2022-08-13 NOTE — Progress Notes (Signed)
Physical Therapy Treatment Patient Details Name: Debbie Montgomery MRN: [id number] DOB: Feb 07, 1946 Today's Date: 08/13/2022   History of Present Illness Pt is a 77 yo F diagnosed wtih right hip osteoarthritis and is s/p elective R THA.  PMH includes HTN, LBBB, and DDD.  Pt also reported having had a kyphoplasty surgery within the last year, unsure of level.    PT Comments    Pt was pleasant and motivated to participate during the session and put forth good effort throughout. Pt demonstrated very good control and stability with transfers and gait and was able to amb 125' with good cadence and step-through pattern.  Pt reported no adverse symptoms during the session other than min to mod R hip pain during weight bearing that dropped to 1/10 at rest.  Questions from pt and spouse addressed.  Pt will benefit from HHPT upon discharge to safely address deficits listed in patient problem list for decreased caregiver assistance and eventual return to PLOF.      Recommendations for follow up therapy are one component of a multi-disciplinary discharge planning process, led by the attending physician.  Recommendations may be updated based on patient status, additional functional criteria and insurance authorization.  Follow Up Recommendations  Home health PT     Assistance Recommended at Discharge Intermittent Supervision/Assistance  Patient can return home with the following A little help with walking and/or transfers;A little help with bathing/dressing/bathroom;Assistance with cooking/housework;Assist for transportation   Equipment Recommendations  None recommended by PT    Recommendations for Other Services       Precautions / Restrictions Precautions Precautions: Anterior Hip;Fall Precaution Booklet Issued: Yes (comment) Restrictions Weight Bearing Restrictions: Yes RLE Weight Bearing: Weight bearing as tolerated     Mobility  Bed Mobility               General bed  mobility comments: NT, pt found and left sitting at EOB    Transfers Overall transfer level: Needs assistance Equipment used: Rolling walker (2 wheels) Transfers: Sit to/from Stand Sit to Stand: Supervision           General transfer comment: Min verbal cues for sequencing most notably for hand placement, very good control and stability during sit to/from stand    Ambulation/Gait Ambulation/Gait assistance: Supervision Gait Distance (Feet): 125 Feet Assistive device: Rolling walker (2 wheels) Gait Pattern/deviations: Step-to pattern, Decreased stance time - right, Decreased step length - left, Step-through pattern Gait velocity: decreased     General Gait Details: Step-to pattern initially that quickly progressed to step-through, steady with no overt LOB or buckling   Stairs             Wheelchair Mobility    Modified Rankin (Stroke Patients Only)       Balance Overall balance assessment: Needs assistance   Sitting balance-Leahy Scale: Normal     Standing balance support: Bilateral upper extremity supported, During functional activity Standing balance-Leahy Scale: Good                              Cognition Arousal/Alertness: Awake/alert Behavior During Therapy: WFL for tasks assessed/performed Overall Cognitive Status: Within Functional Limits for tasks assessed                                          Exercises Other Exercises Other Exercises: Anterior hip  precaution review Other Exercises: Car transfer sequencing education    General Comments        Pertinent Vitals/Pain Pain Assessment Pain Assessment: 0-10 Pain Score: 1  Pain Location: R hip Pain Descriptors / Indicators: Sore Pain Intervention(s): Repositioned, Premedicated before session, Monitored during session    Home Living                          Prior Function            PT Goals (current goals can now be found in the care plan  section) Progress towards PT goals: Progressing toward goals    Frequency    BID      PT Plan Current plan remains appropriate    Co-evaluation              AM-PAC PT "6 Clicks" Mobility   Outcome Measure  Help needed turning from your back to your side while in a flat bed without using bedrails?: A Little Help needed moving from lying on your back to sitting on the side of a flat bed without using bedrails?: A Little Help needed moving to and from a bed to a chair (including a wheelchair)?: A Little Help needed standing up from a chair using your arms (e.g., wheelchair or bedside chair)?: A Little Help needed to walk in hospital room?: A Little Help needed climbing 3-5 steps with a railing? : A Little 6 Click Score: 18    End of Session Equipment Utilized During Treatment: Gait belt Activity Tolerance: Patient tolerated treatment well Patient left: with family/visitor present;with call bell/phone within reach;Other (comment) (Pt left sitting at EOB per pt request) Nurse Communication: Mobility status;Weight bearing status PT Visit Diagnosis: Other abnormalities of gait and mobility (R26.89);Muscle weakness (generalized) (M62.81);Pain Pain - Right/Left: Right Pain - part of body: Hip     Time: [id number] PT Time Calculation (min) (ACUTE ONLY): 21 min  Charges:  $Gait Training: 8-22 mins                     D. Scott Kateria Cutrona PT, DPT 08/13/22, 10:40 AM

## 2022-08-13 NOTE — Discharge Summary (Addendum)
Physician Discharge Summary  Patient ID: Abida Murton MRN: [id number] DOB/AGE: Mar 22, 1946 77 y.o.  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     Transfusion: none   Consultants (if any):   Discharged Condition: Improved  Hospital Course: Khylee Guglielmo is an 77 y.o. female who was admitted 08/12/2022 with a diagnosis of Osteoarthritis of right hip and went to the operating room on 08/12/2022 and underwent the above named procedures.    Surgeries: Procedure(s): TOTAL HIP ARTHROPLASTY ANTERIOR APPROACH on 08/12/2022 Patient tolerated the surgery well. Taken to PACU where she was stabilized and then transferred to the orthopedic floor.  Started on Lovenox 40 mg q 24 hrs. TEDs and SCDs applied bilaterally. Heels elevated on bed. No evidence of DVT. Negative Homan. Physical therapy started on day #1 for gait training and transfer. OT started day #1 for ADL and assisted devices. Patient's IV was d/c on day #1. Patient was able to safely and independently complete all PT goals. PT recommending discharge to home.  On post op day #1 patient was stable and ready for discharge to home with HHPT.  Implants: Trident II  Tritanium Clusterhole 62m  w/ x2 screws  Liner: MDM 376mD  Stem: Insignia #5 Std offset  Head:X3 MDM 3870m/LFIT V40 22.2mm34mmm 67md   She was given perioperative antibiotics:  Anti-infectives (From admission, onward)    Start     Dose/Rate Route Frequency Ordered Stop   08/12/22 1700  ceFAZolin (ANCEF) IVPB 2g/100 mL premix        2 g 200 mL/hr over 30 Minutes Intravenous Every 6 hours 08/12/22 1526 08/12/22 2238   08/12/22 1600  valACYclovir (VALTREX) tablet 1,000 mg       Note to Pharmacy: Dr CorleArmy Melia     1,000 mg Oral Daily 08/12/22 1526     08/12/22 0905  ceFAZolin (ANCEF) 2-4 GM/100ML-% IVPB       Note to Pharmacy: [facility] Keyabinet override      08/12/22 0905 08/12/22 1109   08/12/22 0600  ceFAZolin (ANCEF) IVPB 2g/100 mL premix        2 g 200 mL/hr over 30 Minutes Intravenous On call to O.R. 08/12/22 0015 08/12/22 1119     .  She was given sequential compression devices, early ambulation, and Lovenox TEDs for DVT prophylaxis.  She benefited maximally from the hospital stay and there were no complications.    Recent vital signs:  Vitals:   08/13/22 0112 08/13/22 0500  BP: (!) 132/55 (!) 103/52  Pulse: (!) 57 63  Resp: 16 16  Temp:    SpO2: 96% 94%    Recent laboratory studies:  Lab Results  Component Value Date  HGB 10.0 (L) 08/13/2022   HGB 13.4 07/31/2022   HGB 13.5 04/02/2022   Lab Results  Component Value Date   WBC 10.0 08/13/2022   PLT 191 08/13/2022   No results found for: "INR" Lab Results  Component Value Date   NA 134 (L) 08/13/2022   K 3.4 (L) 08/13/2022   CL 103 08/13/2022   CO2 28 08/13/2022   BUN 16 08/13/2022   CREATININE 0.52 08/13/2022   GLUCOSE 127 (H) 08/13/2022    Discharge Medications:   Allergies as of 08/13/2022   No Known Allergies      Medication List     STOP taking these medications    aspirin-acetaminophen-caffeine 250-250-65 MG tablet Commonly known as: EXCEDRIN MIGRAINE       TAKE these  medications    acetaminophen 500 MG tablet Commonly known as: TYLENOL Take 2 tablets (1,000 mg total) by mouth every 8 (eight) hours.   alendronate 70 MG tablet Commonly known as: FOSAMAX Take 1 tablet (70 mg total) by mouth every 7 (seven) days. Take with a full glass of water on an empty stomach.   atorvastatin 10 MG tablet Commonly known as: LIPITOR Take 1 tablet (10 mg total) by mouth daily.   celecoxib 200 MG capsule Commonly known as: CeleBREX Take 1 capsule (200 mg total) by mouth 2 (two) times daily for 14 days.   [employer] FIBER GUMMIES PO Take by mouth.   diphenhydramine-acetaminophen 25-500 MG Tabs tablet Commonly known as: TYLENOL PM Take 1 tablet by mouth at bedtime as needed.   enoxaparin 40 MG/0.4ML injection Commonly known as: LOVENOX Inject 0.4 mLs (40 mg total) into the skin daily for 14 days.   FLUoxetine 20 MG tablet Commonly known as: PROZAC Take 1 tablet (20 mg total) by mouth daily.   hydrochlorothiazide 12.5 MG tablet Commonly known as: HYDRODIURIL Take 1 tablet (12.5 mg total) by mouth daily.   HYDROcodone-acetaminophen 5-325 MG tablet Commonly known as: NORCO/VICODIN Take 0.5-1 tablets by mouth every 8 (eight) hours as needed for severe pain (breakthrough pain). What changed:  how much to take when to take this reasons to take this   KRILL OIL OMEGA-3 PO Take by mouth. Krill oil 1600 mg   METAMUCIL FIBER PO Take by mouth.   metoprolol tartrate 100 MG tablet Commonly known as: LOPRESSOR Take 1 tablet (100 mg total) by mouth once for 1 dose. Take 2 hours before the test.   ondansetron 4 MG tablet Commonly known as: ZOFRAN Take 1 tablet (4 mg total) by mouth every 6 (six) hours as needed for nausea.   OVER THE COUNTER MEDICATION Garcinia Cambogia Extract 1600 mg  Calcium 222 mg   OVER THE COUNTER MEDICATION Fish oil 1413 mg  EPA 381 mg  DHA 254 mg Turmeric 400 mg   PROBIOTIC ACIDOPHILUS PO Take by mouth.   senna 8.6 MG  tablet Commonly known as: SENOKOT Take 1 tablet by mouth daily.   traMADol 50 MG tablet Commonly known as: ULTRAM Take 1 tablet (50 mg total) by mouth every 6 (six) hours as needed for moderate pain.   valACYclovir 1000 MG tablet Commonly known as: VALTREX Take 1,000 mg by mouth daily. Dr Corley/ Derm   vitamin C 1000 MG tablet Take 1,000 mg by mouth daily.        Diagnostic Studies: DG HIP UNILAT WITH PELVIS 1V RIGHT  Result Date: 08/12/2022 CLINICAL DATA:  Total right hip arthroplasty. Intraoperative fluoroscopy. EXAM: DG HIP (WITH OR WITHOUT PELVIS)  1V RIGHT COMPARISON:  None Available. FINDINGS: Images were performed intraoperatively without the presence of a radiologist. Postsurgical changes are seen of total right hip arthroplasty. No hardware complication is seen. Total fluoroscopy images: 4 Total fluoroscopy time: 34 seconds Total dose: Radiation Exposure Index (as provided by the fluoroscopic device): 2.81 mGy air Kerma Please see intraoperative findings for further detail. IMPRESSION: Fluoroscopic guidance provided for total right hip arthroplasty. Electronically Signed   By: Yvonne Kendall M.D.   On: 08/12/2022 13:08   DG C-Arm 1-60 Min-No Report  Result Date: 08/12/2022 Fluoroscopy was utilized by the requesting physician.  No radiographic interpretation.   CT CORONARY MORPH W/CTA COR W/SCORE W/CA W/CM &/OR WO/CM  Addendum Date: 08/12/2022   ADDENDUM REPORT: 08/05/2022 16:02 EXAM: OVER-READ INTERPRETATION  CT CHEST The following report is an over-read performed by radiologist Dr. Carolynn Comment [facility] on 08/05/2022. This over-read does not include interpretation of cardiac or coronary anatomy or pathology. The coronary CTA interpretation by the cardiologist is attached. COMPARISON:  None. FINDINGS: Visualized esophagus is within normal limits. There are calcified mediastinal and bilateral hilar lymph nodes. No acute lung infiltrate identified. There is some  linear scarring or atelectasis in the right mid lung. No pleural effusion. Visualized upper abdomen is within normal limits. No acute osseous abnormalities. IMPRESSION: Calcified mediastinal and bilateral hilar lymph nodes compatible with old granulomatous disease. Electronically Signed   By: Ronney Asters M.D.   On: 08/05/2022 16:02   Result Date: 08/05/2022 CLINICAL DATA:  Chest pain, new LBBB, pre op eval EXAM: Cardiac/Coronary  CTA TECHNIQUE: The patient was scanned on a Siemens Somatom go.Top scanner. : A retrospective scan was triggered in the ascending thoracic aorta. Axial non-contrast 3 mm slices were carried out through the heart. The data set was analyzed on a dedicated work station and scored using the [place]. Gantry rotation speed was 66 msecs and collimation was .6 mm. '100mg'$  of metoprolol and 0.8 mg of sl NTG was given. The 3D data set was reconstructed in 5% intervals of the 60-95 % of the R-R cycle. Diastolic phases were analyzed on a dedicated work station using MPR, MIP and VRT modes. The patient received 100 cc of contrast. FINDINGS: Aorta:  Normal size.  No calcifications.  No dissection. Aortic Valve:  Trileaflet.  No calcifications. Coronary Arteries:  Normal coronary origin.  Right dominance. RCA is a dominant artery. There is calcified plaque proximally causing minimal stenosis (<25%). Left main gives rise to LAD and LCX arteries. LM has no disease. LAD has calcified plaque in the mid vessel causing mild stenosis (25-49%). LCX is a non-dominant artery.  There is no plaque. Other findings: Normal pulmonary vein drainage into the left atrium. Normal left atrial appendage without a thrombus. Normal size of the pulmonary artery. IMPRESSION: 1. Coronary calcium score of 188. This was 73rd percentile for age and sex matched control. 2. Normal coronary origin with right dominance. 3. Mild mid LAD stenosis (25-49%). 4. Minimal proximal RCA stenosis (<25%). 5. CAD-RADS 2. Mild non-obstructive  CAD (25-49%). Consider non-atherosclerotic causes of chest pain. Consider preventive therapy and risk factor modification. Electronically Signed: By: Kate Sable M.D. On: 08/05/2022 15:43    Disposition:      Follow-up Information     Duanne Guess, PA-C Follow up in 2 week(s).   Specialties: Orthopedic Surgery, Emergency Medicine Contact information: [place] [address] [phone]  Signed: Dorise Hiss CHRISTOPHER 08/13/2022, 7:51 AM

## 2022-08-13 NOTE — Progress Notes (Addendum)
   Subjective: 1 Day Post-Op Procedure(s) (LRB): TOTAL HIP ARTHROPLASTY ANTERIOR APPROACH (Right) Patient reports pain as mild.   Patient is well, and has had no acute complaints or problems Denies any CP, SOB, ABD pain. We will continue therapy today.  Plan is to go Home after hospital stay.  Objective: Vital signs in last 24 hours: Temp:  [97 F (36.1 C)-98.3 F (36.8 C)] 98.2 F (36.8 C) (03/11 1947) Pulse Rate:  [56-75] 63 (03/12 0500) Resp:  [11-25] 16 (03/12 0500) BP: (98-134)/(52-90) 103/52 (03/12 0500) SpO2:  [94 %-98 %] 94 % (03/12 0500) Weight:  [65.3 kg] 65.3 kg (03/11 0916)  Intake/Output from previous day: 03/11 0701 - 03/12 0700 In: 2363.8 [P.O.:1560; I.V.:493.8; IV Piggyback:310] Out: 1000 [Urine:700; Blood:300] Intake/Output this shift: No intake/output data recorded.  Recent Labs    08/13/22 0602  HGB 10.0*   Recent Labs    08/13/22 0602  WBC 10.0  RBC 3.12*  HCT 29.7*  PLT 191   Recent Labs    08/13/22 0602  NA 134*  K 3.4*  CL 103  CO2 28  BUN 16  CREATININE 0.52  GLUCOSE 127*  CALCIUM 8.0*   No results for input(s): "LABPT", "INR" in the last 72 hours.  EXAM General - Patient is Alert, Appropriate, and Oriented Extremity - Neurovascular intact Sensation intact distally Intact pulses distally Dorsiflexion/Plantar flexion intact No cellulitis present Compartment soft Dressing - dressing C/D/I and no drainage Motor Function - intact, moving foot and toes well on exam.   Past Medical History:  Diagnosis Date   Arthritis    DDD (degenerative disc disease), lumbar    HLD (hyperlipidemia)    Hypertension    New onset left bundle branch block (LBBB) 07/31/2022   a.) noted on preoperative ECG; (+) associated retrosternal chest pressured for ~ 10 days.    Assessment/Plan:   1 Day Post-Op Procedure(s) (LRB): TOTAL HIP ARTHROPLASTY ANTERIOR APPROACH (Right) Principal Problem:   Osteoarthritis of right hip  Estimated body mass  index is 23.96 kg/m as calculated from the following:   Height as of this encounter: 5\' 5"  (1.651 m).   Weight as of this encounter: 65.3 kg. Advance diet Up with therapy Pain well controlled  Vital signs stable, BP soft. Aymptomatic. Hold HCTZ.   Labs are stable, Hgb 10.0. K 3.4 , one time dose of oral K given  Patient doing well with no complaints. Good progress with PT yesterday. Anticipate discharge to home with HHPT today pending safe completion of PT goals this am.  DVT Prophylaxis - Lovenox, TED hose, and SCDs Weight-Bearing as tolerated to right leg   T. Rachelle Hora, PA-C [place] 08/13/2022, 7:44 AM   Patient seen and examined, agree with above plan.  The patient is doing well status post right anterior total hip arthroplasty, no concerns at this time.  Pain is controlled.  Discussed DVT prophylaxis, pain medication use, and safe transition to home.  All questions answered the patient agrees with above plan will go home after clears PT.   Steffanie Rainwater MD

## 2022-08-13 NOTE — Evaluation (Signed)
Occupational Therapy Evaluation Patient Details Name: Debbie Montgomery MRN: [id number] DOB: 12-30-1945 Today's Date: 08/13/2022   History of Present Illness Pt is a 77 yo F diagnosed wtih right hip osteoarthritis and is s/p elective R THA.  PMH includes HTN, LBBB, and DDD.  Pt also reported having had a kyphoplasty surgery within the last year, unsure of level.   Clinical Impression    Upon entering the room, pt seated on EOB with husband present in room. Pt is agreeable to OT assessment and education in preparation of discharge home. Pt being ind at baseline without use of AD. Husband is available to assist as needed at discharge. Education completed on pain management and how to increase Ind with self care tasks. Pt able to perform UB self care with set up A to obtain needed items. Pt donning LB clothing items with cuing for technique but supervision overall for sit <>stand. Pt feels comfortable about managing at home. No further questions or concerns at this time. OT to complete orders and sign off.    Recommendations for follow up therapy are one component of a multi-disciplinary discharge planning process, led by the attending physician.  Recommendations may be updated based on patient status, additional functional criteria and insurance authorization.   Follow Up Recommendations  No OT follow up     Assistance Recommended at Discharge Set up Supervision/Assistance  Patient can return home with the following Assistance with cooking/housework;Assist for transportation;Help with stairs or ramp for entrance    Functional Status Assessment  Patient has not had a recent decline in their functional status  Equipment Recommendations  None recommended by OT       Precautions / Restrictions Precautions Precautions: Anterior Hip;Fall Precaution Booklet Issued: Yes (comment) Restrictions Weight Bearing Restrictions: Yes RLE Weight Bearing: Weight bearing as tolerated       Mobility Bed Mobility               General bed mobility comments: NT, pt found and left sitting at EOB    Transfers Overall transfer level: Needs assistance Equipment used: Rolling walker (2 wheels) Transfers: Sit to/from Stand Sit to Stand: Supervision                  Balance Overall balance assessment: Needs assistance   Sitting balance-Leahy Scale: Normal     Standing balance support: Bilateral upper extremity supported, During functional activity Standing balance-Leahy Scale: Good                             ADL either performed or assessed with clinical judgement   ADL Overall ADL's : Needs assistance/impaired                 Upper Body Dressing : Set up;Sitting   Lower Body Dressing: Supervision/safety;Sit to/from stand   Toilet Transfer: Supervision/safety;Rolling walker (2 wheels)   Toileting- Clothing Manipulation and Hygiene: Supervision/safety Toileting - Clothing Manipulation Details (indicate cue type and reason): simulated     Functional mobility during ADLs: Supervision/safety;Rolling walker (2 wheels)       Vision Patient Visual Report: No change from baseline              Pertinent Vitals/Pain Pain Assessment Pain Assessment: 0-10 Pain Score: 1  Pain Location: R hip Pain Descriptors / Indicators: Sore Pain Intervention(s): Premedicated before session, Repositioned     Hand Dominance Right   Extremity/Trunk Assessment Upper Extremity Assessment Upper Extremity Assessment:  Overall [facility] for tasks assessed   Lower Extremity Assessment Lower Extremity Assessment: Defer to PT evaluation       Communication Communication Communication: No difficulties   Cognition Arousal/Alertness: Awake/alert Behavior During Therapy: WFL for tasks assessed/performed Overall Cognitive Status: Within Functional Limits for tasks assessed                                                  Home  Living Family/patient expects to be discharged to:: Private residence Living Arrangements: Spouse/significant other Available Help at Discharge: Family;Available 24 hours/day Type of Home: House Home Access: Level entry     Home Layout: One level     Bathroom Shower/Tub: Occupational psychologist: Handicapped height     Home Equipment: [place] - single point;Shower seat;Rolling Environmental consultant (2 wheels)          Prior Functioning/Environment Prior Level of Function : Independent/Modified Independent             Mobility Comments: Ind amb limited community distances more recently without an AD, activity tolerance limited by R hip pain with walking, one fall in the last year while playing pickleball leading to compression fracture and kyphoplasty ADLs Comments: Ind with ADLs, IADLs, and driving                 OT Goals(Current goals can be found in the care plan section) Acute Rehab OT Goals Patient Stated Goal: to go home OT Goal Formulation: With patient/family Time For Goal Achievement: 08/13/22 Potential to Achieve Goals: Good  OT Frequency:         AM-PAC OT "6 Clicks" Daily Activity     Outcome Measure Help from another person eating meals?: None Help from another person taking care of personal grooming?: None Help from another person toileting, which includes using toliet, bedpan, or urinal?: None Help from another person bathing (including washing, rinsing, drying)?: None Help from another person to put on and taking off regular upper body clothing?: None Help from another person to put on and taking off regular lower body clothing?: None 6 Click Score: 24   End of Session Equipment Utilized During Treatment: Rolling walker (2 wheels) Nurse Communication: Mobility status  Activity Tolerance: Patient tolerated treatment well Patient left: in bed;with family/visitor present                   Time: [id number] OT Time Calculation (min): 14 min Charges:   OT General Charges $OT Visit: 1 Visit OT Evaluation $OT Eval Low Complexity: 1 Low OT Treatments $Self Care/Home Management : 8-22 mins  Darleen Crocker, MS, OTR/L , CBIS ascom [phone]  08/13/22, 11:51 AM

## 2022-08-14 ENCOUNTER — Telehealth: Payer: Self-pay

## 2022-08-14 LAB — SURGICAL PATHOLOGY

## 2022-08-14 NOTE — Anesthesia Postprocedure Evaluation (Signed)
Anesthesia Post Note  Patient: Debbie Montgomery  Procedure(s) Performed: TOTAL HIP ARTHROPLASTY ANTERIOR APPROACH (Right: Hip)  Patient location during evaluation: Nursing Unit Anesthesia Type: General Level of consciousness: oriented and awake and alert Pain management: pain level controlled Vital Signs Assessment: post-procedure vital signs reviewed and stable Respiratory status: spontaneous breathing, respiratory function stable and patient connected to nasal cannula oxygen Cardiovascular status: blood pressure returned to baseline and stable Postop Assessment: no headache, no backache and no apparent nausea or vomiting Anesthetic complications: no  No notable events documented.   Last Vitals:  Vitals:   08/13/22 0500 08/13/22 0932  BP: (!) 103/52 120/66  Pulse: 63 (!) 58  Resp: 16 16  Temp:  36.6 C  SpO2: 94% 98%    Last Pain:  Vitals:   08/13/22 0940  TempSrc:   PainSc: [place]

## 2022-08-14 NOTE — Transitions of Care (Post Inpatient/ED Visit) (Signed)
   08/14/2022  Name: Rogene Meth [facility] MRN: [id number] DOB: [birth year]  Today's TOC FU Call Status: Today's TOC FU Call Status:: Successful TOC FU Call Competed TOC FU Call Complete Date: 08/14/22  Transition Care Management Follow-up Telephone Call Date of Discharge: 08/13/22 Discharge Facility: [facility]) Type of Discharge: Inpatient Admission Primary Inpatient Discharge Diagnosis:: osteoarthritis How have you been since you were released from the hospital?: Better Any questions or concerns?: No  Items Reviewed: Did you receive and understand the discharge instructions provided?: Yes Any new allergies since your discharge?: No Dietary orders reviewed?: Yes Do you have support at home?: Yes People in Home: child(ren), adult, spouse  Home Care and Equipment/Supplies: [place] Ordered?: Yes Name of [place]:: [place] Has Agency set up a time to come to your home?: No Any new equipment or medical supplies ordered?: NA  Functional Questionnaire: Do you need assistance with bathing/showering or dressing?: No Do you need assistance with meal preparation?: Yes Do you need assistance with eating?: No Do you have difficulty maintaining continence: No Do you need assistance with getting out of bed/getting out of a chair/moving?: Yes Do you have difficulty managing or taking your medications?: No  Folllow up appointments reviewed: PCP Follow-up appointment confirmed?: NA Specialist Hospital Follow-up appointment confirmed?: Yes Follow-Up Specialty Provider:: Dr Arvella Nigh Do you need transportation to your follow-up appointment?: No Do you understand care options if your condition(s) worsen?: Yes-patient verbalized understanding    [place], [place] Nurse Health Advisor Direct Dial [phone]

## 2022-08-29 ENCOUNTER — Ambulatory Visit: Payer: Medicare Other | Admitting: Family Medicine

## 2022-09-19 ENCOUNTER — Ambulatory Visit (INDEPENDENT_AMBULATORY_CARE_PROVIDER_SITE_OTHER): Payer: Medicare Other

## 2022-09-19 VITALS — Ht 65.0 in | Wt 144.0 lb

## 2022-09-19 DIAGNOSIS — Z0000 Encounter for general adult medical examination without abnormal findings: Secondary | ICD-10-CM | POA: Diagnosis not present

## 2022-09-19 NOTE — Patient Instructions (Signed)
Debbie Montgomery , Thank you for taking time to come for your Medicare Wellness Visit. I appreciate your ongoing commitment to your health goals. Please review the following plan we discussed and let me know if I can assist you in the future.   These are the goals we discussed:  Goals      DIET - EAT MORE FRUITS AND VEGETABLES        This is a list of the screening recommended for you and due dates:  Health Maintenance  Topic Date Due   Zoster (Shingles) Vaccine (1 of 2) Never done   COVID-19 Vaccine (7 - 2023-24 season) 05/08/2022   Colon Cancer Screening  06/03/2022   Hepatitis C Screening: USPSTF Recommendation to screen - Ages 18-79 yo.  02/13/2023*   Flu Shot  01/02/2023   Mammogram  05/24/2023   Medicare Annual Wellness Visit  09/19/2023   DTaP/Tdap/Td vaccine (4 - Td or Tdap) 06/02/2028   Pneumonia Vaccine  Completed   DEXA scan (bone density measurement)  Completed   HPV Vaccine  Aged Out  *Topic was postponed. The date shown is not the original due date.    Advanced directives: NO  Conditions/risks identified: NONE  Next appointment: Follow up in one year for your annual wellness visit 09/25/23 @ 10:30 AM BY PHONE   Preventive Care 65 Years and Older, Female Preventive care refers to lifestyle choices and visits with your health care provider that can promote health and wellness. What does preventive care include? A yearly physical exam. This is also called an annual well check. Dental exams once or twice a year. Routine eye exams. Ask your health care provider how often you should have your eyes checked. Personal lifestyle choices, including: Daily care of your teeth and gums. Regular physical activity. Eating a healthy diet. Avoiding tobacco and drug use. Limiting alcohol use. Practicing safe sex. Taking low-dose aspirin every day. Taking vitamin and mineral supplements as recommended by your health care provider. What happens during an annual well check? The  services and screenings done by your health care provider during your annual well check will depend on your age, overall health, lifestyle risk factors, and family history of disease. Counseling  Your health care provider may ask you questions about your: Alcohol use. Tobacco use. Drug use. Emotional well-being. Home and relationship well-being. Sexual activity. Eating habits. History of falls. Memory and ability to understand (cognition). Work and work Astronomer. Reproductive health. Screening  You may have the following tests or measurements: Height, weight, and BMI. Blood pressure. Lipid and cholesterol levels. These may be checked every 5 years, or more frequently if you are over 9 years old. Skin check. Lung cancer screening. You may have this screening every year starting at age 25 if you have a 30-pack-year history of smoking and currently smoke or have quit within the past 15 years. Fecal occult blood test (FOBT) of the stool. You may have this test every year starting at age 81. Flexible sigmoidoscopy or colonoscopy. You may have a sigmoidoscopy every 5 years or a colonoscopy every 10 years starting at age 75. Hepatitis C blood test. Hepatitis B blood test. Sexually transmitted disease (STD) testing. Diabetes screening. This is done by checking your blood sugar (glucose) after you have not eaten for a while (fasting). You may have this done every 1-3 years. Bone density scan. This is done to screen for osteoporosis. You may have this done starting at age 41. Mammogram. This may be done every  1-2 years. Talk to your health care provider about how often you should have regular mammograms. Talk with your health care provider about your test results, treatment options, and if necessary, the need for more tests. Vaccines  Your health care provider may recommend certain vaccines, such as: Influenza vaccine. This is recommended every year. Tetanus, diphtheria, and acellular  pertussis (Tdap, Td) vaccine. You may need a Td booster every 10 years. Zoster vaccine. You may need this after age 40. Pneumococcal 13-valent conjugate (PCV13) vaccine. One dose is recommended after age 98. Pneumococcal polysaccharide (PPSV23) vaccine. One dose is recommended after age 68. Talk to your health care provider about which screenings and vaccines you need and how often you need them. This information is not intended to replace advice given to you by your health care provider. Make sure you discuss any questions you have with your health care provider. Document Released: 06/16/2015 Document Revised: 02/07/2016 Document Reviewed: 03/21/2015 Elsevier Interactive Patient Education  2017 [employer] Prevention in the Home Falls can cause injuries. They can happen to people of all ages. There are many things you can do to make your home safe and to help prevent falls. What can I do on the outside of my home? Regularly fix the edges of walkways and driveways and fix any cracks. Remove anything that might make you trip as you walk through a door, such as a raised step or threshold. Trim any bushes or trees on the path to your home. Use bright outdoor lighting. Clear any walking paths of anything that might make someone trip, such as rocks or tools. Regularly check to see if handrails are loose or broken. Make sure that both sides of any steps have handrails. Any raised decks and porches should have guardrails on the edges. Have any leaves, snow, or ice cleared regularly. Use sand or salt on walking paths during winter. Clean up any spills in your garage right away. This includes oil or grease spills. What can I do in the bathroom? Use night lights. Install grab bars by the toilet and in the tub and shower. Do not use towel bars as grab bars. Use non-skid mats or decals in the tub or shower. If you need to sit down in the shower, use a plastic, non-slip stool. Keep the floor  dry. Clean up any water that spills on the floor as soon as it happens. Remove soap buildup in the tub or shower regularly. Attach bath mats securely with double-sided non-slip rug tape. Do not have throw rugs and other things on the floor that can make you trip. What can I do in the bedroom? Use night lights. Make sure that you have a light by your bed that is easy to reach. Do not use any sheets or blankets that are too big for your bed. They should not hang down onto the floor. Have a firm chair that has side arms. You can use this for support while you get dressed. Do not have throw rugs and other things on the floor that can make you trip. What can I do in the kitchen? Clean up any spills right away. Avoid walking on wet floors. Keep items that you use a lot in easy-to-reach places. If you need to reach something above you, use a strong step stool that has a grab bar. Keep electrical cords out of the way. Do not use floor polish or wax that makes floors slippery. If you must use wax, use non-skid  floor wax. Do not have throw rugs and other things on the floor that can make you trip. What can I do with my stairs? Do not leave any items on the stairs. Make sure that there are handrails on both sides of the stairs and use them. Fix handrails that are broken or loose. Make sure that handrails are as long as the stairways. Check any carpeting to make sure that it is firmly attached to the stairs. Fix any carpet that is loose or worn. Avoid having throw rugs at the top or bottom of the stairs. If you do have throw rugs, attach them to the floor with carpet tape. Make sure that you have a light switch at the top of the stairs and the bottom of the stairs. If you do not have them, ask someone to add them for you. What else can I do to help prevent falls? Wear shoes that: Do not have high heels. Have rubber bottoms. Are comfortable and fit you well. Are closed at the toe. Do not wear  sandals. If you use a stepladder: Make sure that it is fully opened. Do not climb a closed stepladder. Make sure that both sides of the stepladder are locked into place. Ask someone to hold it for you, if possible. Clearly mark and make sure that you can see: Any grab bars or handrails. First and last steps. Where the edge of each step is. Use tools that help you move around (mobility aids) if they are needed. These include: Canes. Walkers. Scooters. Crutches. Turn on the lights when you go into a dark area. Replace any light bulbs as soon as they burn out. Set up your furniture so you have a clear path. Avoid moving your furniture around. If any of your floors are uneven, fix them. If there are any pets around you, be aware of where they are. Review your medicines with your doctor. Some medicines can make you feel dizzy. This can increase your chance of falling. Ask your doctor what other things that you can do to help prevent falls. This information is not intended to replace advice given to you by your health care provider. Make sure you discuss any questions you have with your health care provider. Document Released: 03/16/2009 Document Revised: 10/26/2015 Document Reviewed: 06/24/2014 Elsevier Interactive Patient Education  2017 [employer].

## 2022-09-19 NOTE — Progress Notes (Signed)
I connected with  Ruthann Angulo Maranan on 09/19/22 by a audio enabled telemedicine application and verified that I am speaking with the correct person using two identifiers.  Patient Location: Home  Provider Location: Office/Clinic  I discussed the limitations of evaluation and management by telemedicine. The patient expressed understanding and agreed to proceed.  Subjective:   Debbie Montgomery is a 77 y.o. female who presents for Medicare Annual (Subsequent) preventive examination.  Review of Systems     Cardiac Risk Factors include: advanced age (>30men, >5 women);hypertension     Objective:    Today's Vitals   09/19/22 1100  PainSc: 5    There is no height or weight on file to calculate BMI.     09/19/2022   11:07 AM 08/12/2022    2:23 PM 08/12/2022    9:18 AM 07/31/2022    1:24 PM 02/01/2022    1:46 PM 03/20/2020    2:49 PM  Advanced Directives  Does Patient Have a Medical Advance Directive? No Yes Yes Yes No No  Type of Special educational needs teacher of [employer] Power of [place];Living will Healthcare Power of [place];Living will    Does patient want to make changes to medical advance directive?  No - Patient declined      Copy of Healthcare Power of Attorney in Chart?  No - copy requested No - copy requested     Would patient like information on creating a medical advance directive? No - Patient declined     No - Patient declined    Current Medications (verified) Outpatient Encounter Medications as of 09/19/2022  Medication Sig   alendronate (FOSAMAX) 70 MG tablet TAKE 1 TABLET (70 MG TOTAL) BY MOUTH EVERY 7 DAYS WITH FULL GLASS WATER ON EMPTY STOMACH   Ascorbic Acid (VITAMIN C) 1000 MG tablet Take 1,000 mg by mouth daily.   atorvastatin (LIPITOR) 10 MG tablet Take 1 tablet (10 mg total) by mouth daily.   [employer] FIBER GUMMIES PO Take by mouth.   diphenhydramine-acetaminophen (TYLENOL PM) 25-500 MG TABS tablet Take 1 tablet by mouth at bedtime as  needed.   FLUoxetine (PROZAC) 20 MG tablet Take 1 tablet (20 mg total) by mouth daily.   hydrochlorothiazide (HYDRODIURIL) 12.5 MG tablet Take 1 tablet (12.5 mg total) by mouth daily.   HYDROcodone-acetaminophen (NORCO/VICODIN) 5-325 MG tablet Take 0.5-1 tablets by mouth every 8 (eight) hours as needed for severe pain (breakthrough pain).   KRILL OIL OMEGA-3 PO Take by mouth. Krill oil 1600 mg   Lactobacillus (PROBIOTIC ACIDOPHILUS PO) Take by mouth.   OVER THE COUNTER MEDICATION Garcinia Cambogia Extract 1600 mg  Calcium 222 mg   OVER THE COUNTER MEDICATION Fish oil 1413 mg  EPA 381 mg  DHA 254 mg Turmeric 400 mg   traMADol (ULTRAM) 50 MG tablet Take 1 tablet (50 mg total) by mouth every 6 (six) hours as needed for moderate pain.   valACYclovir (VALTREX) 1000 MG tablet Take 1,000 mg by mouth daily. Dr Corley/ Derm   acetaminophen (TYLENOL) 500 MG tablet Take 2 tablets (1,000 mg total) by mouth every 8 (eight) hours. (Patient not taking: Reported on 09/19/2022)   enoxaparin (LOVENOX) 40 MG/0.4ML injection Inject 0.4 mLs (40 mg total) into the skin daily for 14 days.   METAMUCIL FIBER PO Take by mouth. (Patient not taking: Reported on 09/19/2022)   metoprolol tartrate (LOPRESSOR) 100 MG tablet Take 1 tablet (100 mg total) by mouth once for 1 dose. Take 2 hours before  the test.   ondansetron (ZOFRAN) 4 MG tablet Take 1 tablet (4 mg total) by mouth every 6 (six) hours as needed for nausea. (Patient not taking: Reported on 09/19/2022)   senna (SENOKOT) 8.6 MG tablet Take 1 tablet by mouth daily. (Patient not taking: Reported on 09/19/2022)   No facility-administered encounter medications on file as of 09/19/2022.    Allergies (verified) Patient has no known allergies.   History: Past Medical History:  Diagnosis Date   Arthritis    DDD (degenerative disc disease), lumbar    HLD (hyperlipidemia)    Hypertension    New onset left bundle branch block (LBBB) 07/31/2022   a.) noted on  preoperative ECG; (+) associated retrosternal chest pressured for ~ 10 days.   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   Procedure: TOTAL HIP ARTHROPLASTY ANTERIOR APPROACH;  Surgeon: Reinaldo Berber, MD;  Location: [facility] ORS;  Service: Orthopedics;  Laterality: Right;   TUBAL LIGATION     Family History  Problem Relation Age of Onset   Hypertension Mother    Heart disease Father    Heart attack Maternal Grandmother    Stroke Maternal Grandmother    Social History   Socioeconomic History   Marital status: Married    Spouse name: Not on file   Number of children: Not on file   Years of education: Not on file   Highest education level: Not on file  Occupational History   Not on file  Tobacco Use   Smoking status: Former   Smokeless tobacco: Never  Vaping Use   Vaping Use: Never used  Substance and Sexual Activity   Alcohol use: Yes    Alcohol/week: 1.0 standard drink of alcohol    Types: 1 Glasses of wine per week    Comment: ocassionally   Drug use: Never   Sexual activity: Not Currently  Other Topics Concern   Not on file  Social History Narrative   Not on file   Social Determinants of Health   Financial Resource Strain: Low Risk  (09/19/2022)   Overall Financial Resource Strain (CARDIA)    Difficulty of Paying Living Expenses: Not hard at all  Food Insecurity: No Food Insecurity (09/19/2022)   Hunger Vital Sign    Worried About Running Out of Food in the Last Year: Never true    Ran Out of Food in the Last Year: Never true  Transportation Needs: No Transportation Needs (09/19/2022)    PRAPARE - Administrator, Civil Service (Medical): No    Lack of Transportation (Non-Medical): No  Physical Activity: Insufficiently Active (09/19/2022)   Exercise Vital Sign    Days of Exercise per Week: 3 days    Minutes of Exercise per Session: 30 min  Stress: No Stress Concern Present (09/19/2022)   [employer] of Occupational Health - Occupational Stress Questionnaire    Feeling of Stress : Not at all  Social Connections: Moderately Isolated (09/19/2022)   Social Connection and Isolation Panel [NHANES]  Frequency of Communication with Friends and Family: More than three times a week    Frequency of Social Gatherings with Friends and Family: Three times a week    Attends Religious Services: Never    Active Member of Clubs or Organizations: No    Attends Engineer, structural: Never    Marital Status: Married    Tobacco Counseling Counseling given: Not Answered   Clinical Intake:  Pre-visit preparation completed: Yes  Pain : 0-10 Pain Score: 5  Pain Type: Chronic pain Pain Location: Hip Pain Orientation: Right Pain Radiating Towards: down right leg Pain Relieving Factors: injections  Pain Relieving Factors: injections  Nutritional Risks: None Diabetes: No  How often do you need to have someone help you when you read instructions, pamphlets, or other written materials from your doctor or pharmacy?: 1 - Never  Diabetic?NO  Interpreter Needed?: No  Information entered by :: Kennedy Bucker, LPN   Activities of Daily Living    09/19/2022   11:09 AM 09/15/2022    1:34 PM  In your present state of health, do you have any difficulty performing the following activities:  Hearing? 0 0  Vision? 0 0  Difficulty concentrating or making decisions? 0 0  Walking or climbing stairs? 0 1  Comment UNLESS [place] IS ACTING UP   Dressing or bathing? 0 0  Doing errands, shopping? 0 0  Preparing Food and eating ? N N  Using the Toilet? N N  In the  past six months, have you accidently leaked urine? Y Y  Do you have problems with loss of bowel control? N N  Managing your Medications? N N  Managing your Finances? N N  Housekeeping or managing your Housekeeping? N N    Patient Care Team: Duanne Limerick, MD as PCP - General (Family Medicine)  Indicate any recent Medical Services you may have received from other than [facility] providers in the past year (date may be approximate).     Assessment:   This is a routine wellness examination for [place].  Hearing/Vision screen Hearing Screening - Comments:: WEARS AIDS Vision Screening - Comments:: CONTACT IN ONE EYE, OR READERS- DR.WONG IN [place]  Dietary issues and exercise activities discussed: Current Exercise Habits: Home exercise routine, Type of exercise: walking, Time (Minutes): 30, Frequency (Times/Week): 3, Weekly Exercise (Minutes/Week): 90, Intensity: Mild   Goals Addressed             This Visit's Progress    DIET - EAT MORE FRUITS AND VEGETABLES         Depression Screen    09/19/2022   11:06 AM 07/09/2022    2:59 PM 04/18/2022    9:49 AM 11/08/2021    9:03 AM 10/24/2020   10:08 AM 06/22/2020    1:18 PM 03/20/2020    2:48 PM  PHQ 2/9 Scores  PHQ - 2 Score 0 0 0 0 0 0 0  PHQ- 9 Score 0 0 0 1 0 0     Fall Risk    09/19/2022   11:08 AM 09/15/2022    1:34 PM 07/09/2022    2:57 PM 04/18/2022    9:49 AM 02/12/2022    8:30 AM  Fall Risk   Falls in the past year? 1 1 1  0 1  Number falls in past yr: 0 0 0 0 1  Injury with Fall? 1 1 1  0 1  Risk for fall due to : History of fall(s)  No Fall Risks No Fall  Risks   Follow up Falls prevention discussed;Falls evaluation completed   Falls evaluation completed Falls evaluation completed;Falls prevention discussed;Follow up appointment    FALL RISK PREVENTION PERTAINING TO THE HOME:  Any stairs in or around the home? No  If so, are there any without handrails? No  Home free of loose throw rugs in walkways, pet beds,  electrical cords, etc? Yes  Adequate lighting in your home to reduce risk of falls? Yes   ASSISTIVE DEVICES UTILIZED TO PREVENT FALLS:  Life alert? No  Use of a cane, walker or w/c? No  Grab bars in the bathroom? No  Shower chair or bench in shower? Yes  Elevated toilet seat or a handicapped toilet? Yes    Cognitive Function:        09/19/2022   11:13 AM  6CIT Screen  What Year? 0 points  What month? 0 points  What time? 0 points  Count back from 20 0 points  Months in reverse 0 points  Repeat phrase 0 points  Total Score 0 points    Immunizations Immunization History  Administered Date(s) Administered   Fluad Quad(high Dose 65+) 03/13/2022   Influenza, High Dose Seasonal PF 03/12/2019, 03/19/2021   Influenza-Unspecified 02/20/2020   Moderna Sars-Covid-2 Vaccination 07/13/2019, 08/03/2019   PFIZER Comirnaty(Gray Top)Covid-19 Tri-Sucrose Vaccine 11/14/2020, 03/13/2022   PFIZER(Purple Top)SARS-COV-2 Vaccination 04/03/2020, 04/04/2021   PNEUMOCOCCAL CONJUGATE-20 10/24/2020   Pneumococcal Conjugate-13 06/03/2016   Respiratory Syncytial Virus Vaccine,Recomb Aduvanted(Arexvy) 03/19/2022   Tdap 06/04/2015, 05/13/2018, 06/02/2018    TDAP status: Up to date  Flu Vaccine status: Up to date  Pneumococcal vaccine status: Up to date  Covid-19 vaccine status: Completed vaccines HAD RSV 03/19/22 Qualifies for Shingles Vaccine? Yes   Zostavax completed No   Shingrix Completed?: No.    Education has been provided regarding the importance of this vaccine. Patient has been advised to call insurance company to determine out of pocket expense if they have not yet received this vaccine. Advised may also receive vaccine at local pharmacy or Health Dept. Verbalized acceptance and understanding.  Screening Tests Health Maintenance  Topic Date Due   Zoster Vaccines- Shingrix (1 of 2) Never done   COVID-19 Vaccine (7 - 2023-24 season) 05/08/2022   COLONOSCOPY (Pts 45-61yrs Insurance  coverage will need to be confirmed)  06/03/2022   Hepatitis C Screening  02/13/2023 (Originally 03/31/1964)   INFLUENZA VACCINE  01/02/2023   MAMMOGRAM  05/24/2023   Medicare Annual Wellness (AWV)  09/19/2023   DTaP/Tdap/Td (4 - Td or Tdap) 06/02/2028   Pneumonia Vaccine 24+ Years old  Completed   DEXA SCAN  Completed   HPV VACCINES  Aged Out    Health Maintenance  Health Maintenance Due  Topic Date Due   Zoster Vaccines- Shingrix (1 of 2) Never done   COVID-19 Vaccine (7 - 2023-24 season) 05/08/2022   COLONOSCOPY (Pts 45-36yrs Insurance coverage will need to be confirmed)  06/03/2022    Colorectal cancer screening: Referral to GI placed SCHEDULED FOR JUNE 2024. Pt aware the office will call re: appt.  Mammogram status: Completed 05/23/22. Repeat every year  Bone Density status: Completed 05/23/22. Results reflect: Bone density results: OSTEOPOROSIS. Repeat every 2 years.  Lung Cancer Screening: (Low Dose CT Chest recommended if Age 89-80 years, 30 pack-year currently smoking OR have quit w/in 15years.) does not qualify.   Additional Screening:  Hepatitis C Screening: does qualify; Completed NO  Vision Screening: Recommended annual ophthalmology exams for early detection of glaucoma and other disorders  of the eye. Is the patient up to date with their annual eye exam?  Yes  Who is the provider or what is the name of the office in which the patient attends annual eye exams? DR.WONG IN [place] If pt is not established with a provider, would they like to be referred to a provider to establish care? No .   Dental Screening: Recommended annual dental exams for proper oral hygiene  Community Resource Referral / Chronic Care Management: CRR required this visit?  No   CCM required this visit?  No      Plan:     I have personally reviewed and noted the following in the patient's chart:   Medical and social history Use of alcohol, tobacco or illicit drugs  Current  medications and supplements including opioid prescriptions. Patient is currently taking opioid prescriptions. Information provided to patient regarding non-opioid alternatives. Patient advised to discuss non-opioid treatment plan with their provider. Functional ability and status Nutritional status Physical activity Advanced directives List of other physicians Hospitalizations, surgeries, and ER visits in previous 12 months Vitals Screenings to include cognitive, depression, and falls Referrals and appointments  In addition, I have reviewed and discussed with patient certain preventive protocols, quality metrics, and best practice recommendations. A written personalized care plan for preventive services as well as general preventive health recommendations were provided to patient.     Hal Hope, LPN   [id number]/61/0960   Nurse Notes: [facility]

## 2022-10-17 ENCOUNTER — Encounter: Payer: Self-pay | Admitting: Family Medicine

## 2022-10-17 ENCOUNTER — Ambulatory Visit (INDEPENDENT_AMBULATORY_CARE_PROVIDER_SITE_OTHER): Payer: Medicare Other | Admitting: Family Medicine

## 2022-10-17 VITALS — BP 120/70 | HR 76 | Ht 65.0 in | Wt 145.0 lb

## 2022-10-17 DIAGNOSIS — F32A Depression, unspecified: Secondary | ICD-10-CM

## 2022-10-17 DIAGNOSIS — I10 Essential (primary) hypertension: Secondary | ICD-10-CM | POA: Diagnosis not present

## 2022-10-17 DIAGNOSIS — E876 Hypokalemia: Secondary | ICD-10-CM | POA: Diagnosis not present

## 2022-10-17 DIAGNOSIS — F419 Anxiety disorder, unspecified: Secondary | ICD-10-CM | POA: Diagnosis not present

## 2022-10-17 DIAGNOSIS — E782 Mixed hyperlipidemia: Secondary | ICD-10-CM

## 2022-10-17 MED ORDER — ATORVASTATIN CALCIUM 10 MG PO TABS: 10.0000 mg | ORAL_TABLET | Freq: Every day | ORAL | 1 refills | Status: DC

## 2022-10-17 MED ORDER — HYDROCHLOROTHIAZIDE 12.5 MG PO TABS: 12.5000 mg | ORAL_TABLET | Freq: Every day | ORAL | 1 refills | Status: DC

## 2022-10-17 MED ORDER — FLUOXETINE HCL 20 MG PO TABS: 20.0000 mg | ORAL_TABLET | Freq: Every day | ORAL | 1 refills | Status: DC

## 2022-10-17 NOTE — Progress Notes (Signed)
Date:  10/17/2022   Name:  [facility]ie [facility]   DOB:  [birth year]   MRN:  [id number]   Chief Complaint: Hypertension, Hyperlipidemia, Depression, and hypokalemia  Hypertension This is a chronic problem. The current episode started more than 1 year ago. The problem has been gradually improving since onset. The problem is controlled. Pertinent negatives include no blurred vision, chest pain, headaches, palpitations, peripheral edema, PND or shortness of breath. There are no associated agents to hypertension. Risk factors for coronary artery disease include dyslipidemia. Past treatments include diuretics. The current treatment provides moderate improvement. There are no compliance problems.  There is no history of CAD/MI or CVA. There is no history of chronic renal disease, a hypertension causing med or renovascular disease.  Hyperlipidemia This is a chronic problem. The current episode started more than 1 year ago. The problem is controlled. Recent lipid tests were reviewed and are normal. She has no history of chronic renal disease or diabetes. Pertinent negatives include no chest pain, myalgias or shortness of breath. Current antihyperlipidemic treatment includes statins. The current treatment provides moderate improvement of lipids. Risk factors for coronary artery disease include dyslipidemia and hypertension.  Depression        The current episode started more than 1 year ago.   The onset quality is gradual.   The problem occurs intermittently.  The problem has been gradually improving since onset.  Associated symptoms include no fatigue, no helplessness, no hopelessness, no restlessness, no decreased interest, no myalgias, no headaches, not sad and no suicidal ideas.  Past treatments include SSRIs - Selective serotonin reuptake inhibitors.  Compliance with treatment is variable.  Previous treatment provided moderate relief.   Lab Results  Component Value Date   NA 134 (L) 08/13/2022   K  3.4 (L) 08/13/2022   CO2 28 08/13/2022   GLUCOSE 127 (H) 08/13/2022   BUN 16 08/13/2022   CREATININE 0.52 08/13/2022   CALCIUM 8.0 (L) 08/13/2022   EGFR 100 04/02/2022   GFRNONAA >60 08/13/2022   Lab Results  Component Value Date   CHOL 154 04/18/2022   HDL 79 04/18/2022   LDLCALC 65 04/18/2022   TRIG 47 04/18/2022   Lab Results  Component Value Date   TSH 1.25 06/25/2019   No results found for: "HGBA1C" Lab Results  Component Value Date   WBC 10.0 08/13/2022   HGB 10.0 (L) 08/13/2022   HCT 29.7 (L) 08/13/2022   MCV 95.2 08/13/2022   PLT 191 08/13/2022   Lab Results  Component Value Date   ALT 12 07/31/2022   AST 17 07/31/2022   ALKPHOS 28 (L) 07/31/2022   BILITOT 0.5 07/31/2022   No results found for: "25OHVITD2", "25OHVITD3", "VD25OH"   Review of Systems  Constitutional: Negative.  Negative for chills, fatigue, fever and unexpected weight change.  HENT:  Negative for congestion, ear discharge, ear pain, rhinorrhea, sinus pressure, sneezing and sore throat.   Eyes:  Negative for blurred vision.  Respiratory:  Negative for cough, shortness of breath, wheezing and stridor.   Cardiovascular:  Negative for chest pain, palpitations and PND.  Gastrointestinal:  Negative for abdominal pain, blood in stool, constipation, diarrhea and nausea.  Genitourinary:  Negative for dysuria, flank pain, frequency, hematuria, urgency and vaginal discharge.  Musculoskeletal:  Negative for arthralgias, back pain and myalgias.  Skin:  Negative for rash.  Neurological:  Negative for dizziness, weakness and headaches.  Hematological:  Negative for adenopathy. Does not bruise/bleed easily.  Psychiatric/Behavioral:  Positive for  depression. Negative for dysphoric mood and suicidal ideas. The patient is not nervous/anxious.     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   Aphthous ulcer of mouth 08/11/2019   High blood pressure 07/01/2008    No Known Allergies  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   Procedure: TOTAL HIP ARTHROPLASTY ANTERIOR APPROACH;  Surgeon: Reinaldo Berber, MD;  Location: [facility] ORS;  Service: Orthopedics;  Laterality: Right;   TUBAL LIGATION      Social History   Tobacco Use   Smoking status: Former   Smokeless tobacco: Never  Vaping Use   Vaping Use: Never used  Substance Use Topics   Alcohol use: Yes    Alcohol/week: 1.0 standard drink of alcohol    Types: 1 Glasses of wine per week    Comment: ocassionally   Drug use: Never     Medication list has been reviewed and updated.  Current Meds  Medication Sig   acetaminophen (TYLENOL) 500 MG tablet Take 2 tablets (1,000 mg total) by mouth every 8 (eight) hours.   alendronate (FOSAMAX) 70 MG tablet TAKE 1 TABLET (70 MG TOTAL) BY MOUTH EVERY 7 DAYS WITH FULL GLASS WATER ON EMPTY STOMACH   Ascorbic Acid (VITAMIN C) 1000 MG tablet Take 1,000 mg by mouth daily.   atorvastatin (LIPITOR) 10 MG tablet Take 1 tablet (10 mg total) by mouth daily.   [employer] FIBER GUMMIES PO Take by mouth.   diphenhydramine-acetaminophen (TYLENOL PM) 25-500 MG TABS tablet Take 1 tablet by mouth at bedtime as needed.   FLUoxetine (PROZAC) 20 MG tablet Take 1 tablet (20 mg total) by mouth daily.   hydrochlorothiazide (HYDRODIURIL) 12.5 MG tablet Take 1 tablet  (12.5 mg total) by mouth daily.   KRILL OIL OMEGA-3 PO Take by mouth. Krill oil 1600 mg   Lactobacillus (PROBIOTIC ACIDOPHILUS PO) Take by mouth.   METAMUCIL FIBER PO Take by mouth.   metoprolol tartrate (LOPRESSOR) 100 MG tablet Take 1 tablet (100 mg total) by mouth once for 1 dose. Take 2 hours before the test.   OVER THE COUNTER MEDICATION Garcinia Cambogia Extract 1600 mg  Calcium 222 mg   terbinafine (LAMISIL) 250 MG tablet Take by mouth.   valACYclovir (VALTREX) 1000 MG tablet Take 1,000 mg by mouth daily. Dr Charlsie Merles Derm   [DISCONTINUED] OVER THE COUNTER MEDICATION Fish oil 1413 mg  EPA 381 mg  DHA 254 mg Turmeric 400 mg       10/17/2022   10:10 AM 07/09/2022    3:00 PM 04/18/2022    9:50 AM 11/08/2021    9:03 AM  GAD 7 : Generalized Anxiety Score  Nervous, Anxious, on Edge 0 0 0 0  Control/stop worrying 0 0  0 0  Worry too much - different things 0 0 0 0  Trouble relaxing 0 0 0 0  Restless 0 0 0 0  Easily annoyed or irritable 0 0 0 0  Afraid - awful might happen 0 0 0 0  Total GAD 7 Score 0 0 0 0  Anxiety Difficulty Not difficult at all Not difficult at all Not difficult at all Not difficult at all       10/17/2022   10:10 AM 09/19/2022   11:06 AM 07/09/2022    2:59 PM  Depression screen PHQ 2/9  Decreased Interest 0 0 0  Down, Depressed, Hopeless 0 0 0  PHQ - 2 Score 0 0 0  Altered sleeping 0 0 0  Tired, decreased energy 0 0 0  Change in appetite 0 0 0  Feeling bad or failure about yourself  0 0 0  Trouble concentrating 0 0 0  Moving slowly or fidgety/restless 0 0 0  Suicidal thoughts 0 0 0  PHQ-9 Score 0 0 0  Difficult doing work/chores Not difficult at all Not difficult at all Not difficult at all    BP Readings from Last 3 Encounters:  10/17/22 120/70  08/13/22 120/66  08/05/22 130/69    Physical Exam Vitals and nursing note reviewed. Exam conducted with a chaperone present.  Constitutional:      General: She is not in acute distress.    Appearance:  She is not diaphoretic.  HENT:     Head: Normocephalic and atraumatic.     Right Ear: Tympanic membrane and external ear normal.     Left Ear: Tympanic membrane and external ear normal.     Nose: Nose normal.     Mouth/Throat:     Mouth: Mucous membranes are moist.     Pharynx: No oropharyngeal exudate or posterior oropharyngeal erythema.  Eyes:     General:        Right eye: No discharge.        Left eye: No discharge.     Conjunctiva/sclera: Conjunctivae normal.     Pupils: Pupils are equal, round, and reactive to light.  Neck:     Thyroid: No thyromegaly.     Vascular: No JVD.  Cardiovascular:     Rate and Rhythm: Normal rate and regular rhythm.     Heart sounds: Normal heart sounds. No murmur heard.    No friction rub. No gallop.  Pulmonary:     Effort: Pulmonary effort is normal.     Breath sounds: Normal breath sounds. No wheezing, rhonchi or rales.  Abdominal:     General: Bowel sounds are normal.     Palpations: Abdomen is soft. There is no mass.     Tenderness: There is no abdominal tenderness. There is no guarding.  Musculoskeletal:        General: Normal range of motion.     Cervical back: Normal range of motion and neck supple.  Lymphadenopathy:     Cervical: No cervical adenopathy.  Skin:    General: Skin is warm and dry.  Neurological:     Mental Status: She is alert.     Deep Tendon Reflexes: Reflexes are normal and symmetric.     Wt Readings from Last 3 Encounters:  10/17/22 145 lb (65.8 kg)  09/19/22 144 lb (65.3 kg)  08/12/22 143 lb 15.4 oz (65.3 kg)    BP 120/70   Pulse 76   Ht 5\' 5"  (1.651 m)   Wt 145  lb (65.8 kg)   BMI 24.13 kg/m   Assessment and Plan:  1. Essential hypertension Chronic.  Controlled.  Stable.  Blood pressure 120/70.  Asymptomatic.  Tolerating medication well.  Continue hydrochlorothiazide 12.5 mg once a day.  Will recheck potassium.  Will recheck patient in 6 months. - hydrochlorothiazide (HYDRODIURIL) 12.5 MG tablet;  Take 1 tablet (12.5 mg total) by mouth daily.  Dispense: 90 tablet; Refill: 1  2. Mixed hyperlipidemia Chronic.  Controlled.  Stable.  Review of previous lipid panel is acceptable.  Will continue atorvastatin 10 mg once a day. - atorvastatin (LIPITOR) 10 MG tablet; Take 1 tablet (10 mg total) by mouth daily.  Dispense: 90 tablet; Refill: 1  3. Anxiety and depression Chronic.  Controlled.  Stable.  PHQ is 0.  GAD score 0.  Continue Prozac 20 mg once a day.  Will recheck in 6 months. - FLUoxetine (PROZAC) 20 MG tablet; Take 1 tablet (20 mg total) by mouth daily.  Dispense: 90 tablet; Refill: 1  4. Hypokalemia Chronic.  Controlled.  Stable.  Iatrogenic likely secondary to hydrochlorothiazide will check potassium and if decreased will likely supplement. - Potassium    Elizabeth Sauer, MD

## 2022-10-18 LAB — POTASSIUM: Potassium: 4 mmol/L (ref 3.5–5.2)

## 2022-11-02 ENCOUNTER — Other Ambulatory Visit: Payer: Self-pay | Admitting: Family Medicine

## 2022-11-02 DIAGNOSIS — M816 Localized osteoporosis [Lequesne]: Secondary | ICD-10-CM

## 2022-11-04 NOTE — Telephone Encounter (Signed)
Requested medication (s) are due for refill today: yes  Requested medication (s) are on the active medication list: yes  Last refill:  08/13/22 #12  Future visit scheduled: yes  Notes to clinic:  overdue lab work   Requested Prescriptions  Pending Prescriptions Disp Refills   alendronate (FOSAMAX) 70 MG tablet [Pharmacy Med Name: ALENDRONATE SODIUM 70 MG TAB] 12 tablet 0    Sig: TAKE 1 TABLET (70 MG TOTAL) BY MOUTH EVERY 7 DAYS WITH FULL GLASS WATER ON EMPTY STOMACH     Endocrinology:  Bisphosphonates Failed - 11/02/2022  8:38 AM      Failed - Ca in normal range and within 360 days    Calcium  Date Value Ref Range Status  08/13/2022 8.0 (L) 8.9 - 10.3 mg/dL Final         Failed - Vitamin D in normal range and within 360 days    No results found for: "ZO1096EA5", "WU9811BJ4", "VD125OH2TOT", "25OHVITD3", "25OHVITD2", "25OHVITD1", "VD25OH"       Failed - Mg Level in normal range and within 360 days    No results found for: "MG"       Failed - Phosphate in normal range and within 360 days    Phosphorus  Date Value Ref Range Status  05/10/2021 4.3 3.0 - 4.3 mg/dL Final         Passed - Cr in normal range and within 360 days    Creat  Date Value Ref Range Status  04/02/2022 0.47 (L) 0.60 - 1.00 mg/dL Final   Creatinine, Ser  Date Value Ref Range Status  08/13/2022 0.52 0.44 - 1.00 mg/dL Final         Passed - eGFR is 30 or above and within 360 days    GFR calc Af Amer  Date Value Ref Range Status  12/17/2019 103 >59 mL/min/1.73 Final    Comment:    **[facility] currently reports eGFR in compliance with the current**   recommendations of the [employer]. [facility] will   update reporting as new guidelines are published from the NKF-ASN   Task force.    GFR, Estimated  Date Value Ref Range Status  08/13/2022 >60 >60 mL/min Final    Comment:    (NOTE) Calculated using the CKD-EPI Creatinine Equation (2021)    eGFR  Date Value Ref Range Status   04/02/2022 100 > OR = 60 mL/min/1.26m2 Final  05/10/2021 94 >59 mL/min/1.73 Final         Passed - Valid encounter within last 12 months    Recent Outpatient Visits           2 weeks ago Essential hypertension   [place] [facility], [facility]   3 months ago Localized osteoporosis without current pathological fracture   [place] [facility]   6 months ago Essential hypertension   [place] [facility], [facility]   8 months ago Compression fracture of L1 vertebra with routine healing, subsequent encounter   [facility]   12 months ago Essential hypertension   [place] [facility], MD       Future Appointments             In 5 months  Duanne Limerick, MD [facility]            Passed - Bone Mineral Density or Dexa Scan completed in the last 2 years

## 2022-11-28 ENCOUNTER — Encounter: Payer: Self-pay | Admitting: *Deleted

## 2022-11-29 ENCOUNTER — Ambulatory Visit: Payer: Medicare Other | Admitting: Anesthesiology

## 2022-11-29 ENCOUNTER — Ambulatory Visit
Admission: RE | Admit: 2022-11-29 | Discharge: 2022-11-29 | Disposition: A | Discharge status: Home / Self Care | Payer: Medicare Other | Attending: Gastroenterology | Admitting: Gastroenterology

## 2022-11-29 ENCOUNTER — Encounter
Admission: RE | Disposition: A | Discharge status: Home / Self Care | Payer: Self-pay | Source: Home / Self Care | Attending: Gastroenterology

## 2022-11-29 DIAGNOSIS — K640 First degree hemorrhoids: Secondary | ICD-10-CM | POA: Insufficient documentation

## 2022-11-29 DIAGNOSIS — R194 Change in bowel habit: Secondary | ICD-10-CM | POA: Diagnosis present

## 2022-11-29 DIAGNOSIS — I10 Essential (primary) hypertension: Secondary | ICD-10-CM | POA: Diagnosis not present

## 2022-11-29 DIAGNOSIS — M1990 Unspecified osteoarthritis, unspecified site: Secondary | ICD-10-CM | POA: Insufficient documentation

## 2022-11-29 DIAGNOSIS — K5730 Diverticulosis of large intestine without perforation or abscess without bleeding: Secondary | ICD-10-CM | POA: Diagnosis not present

## 2022-11-29 DIAGNOSIS — Z87891 Personal history of nicotine dependence: Secondary | ICD-10-CM | POA: Diagnosis not present

## 2022-11-29 DIAGNOSIS — Z86010 Personal history of colonic polyps: Secondary | ICD-10-CM | POA: Insufficient documentation

## 2022-11-29 DIAGNOSIS — I447 Left bundle-branch block, unspecified: Secondary | ICD-10-CM | POA: Insufficient documentation

## 2022-11-29 DIAGNOSIS — E785 Hyperlipidemia, unspecified: Secondary | ICD-10-CM | POA: Insufficient documentation

## 2022-11-29 DIAGNOSIS — F419 Anxiety disorder, unspecified: Secondary | ICD-10-CM | POA: Insufficient documentation

## 2022-11-29 DIAGNOSIS — Z79899 Other long term (current) drug therapy: Secondary | ICD-10-CM | POA: Insufficient documentation

## 2022-11-29 HISTORY — PX: COLONOSCOPY WITH PROPOFOL: SHX5780

## 2022-11-29 SURGERY — COLONOSCOPY WITH PROPOFOL
Anesthesia: General

## 2022-11-29 MED ORDER — PROPOFOL 500 MG/50ML IV EMUL
INTRAVENOUS | Status: DC | PRN
  Administered 2022-11-29: 100 ug/kg/min via INTRAVENOUS
  Administered 2022-11-29: 50 mg via INTRAVENOUS

## 2022-11-29 MED ORDER — DEXMEDETOMIDINE HCL IN NACL 200 MCG/50ML IV SOLN
INTRAVENOUS | Status: DC | PRN
  Administered 2022-11-29: 4 ug via INTRAVENOUS

## 2022-11-29 MED ORDER — SODIUM CHLORIDE 0.9 % IV SOLN
INTRAVENOUS | Status: DC
  Administered 2022-11-29: 20 mL/h via INTRAVENOUS

## 2022-11-29 NOTE — H&P (Signed)
Outpatient short stay form Pre-procedure 11/29/2022  Debbie Bill, MD  Primary Physician: Duanne Limerick, MD  Reason for visit:  Constipation  History of present illness:    77 y/o lady with history of hypertension, arthritis, and HLD here for colonoscopy due to constipation/change in bowel habits. Last colonoscopy was over 5 years ago and she thinks she had some polyps. No records available. History of hysterectomy. No blood thinners. No family history of GI malignancies.    Current Facility-Administered Medications:    0.9 %  sodium chloride infusion, , Intravenous, Continuous, Jozalyn Baglio, Rossie Muskrat, MD, Last Rate: 20 mL/hr at 11/29/22 0806, Continued from Pre-op at 11/29/22 0806  Medications Prior to Admission  Medication Sig Dispense Refill Last Dose   acetaminophen (TYLENOL) 500 MG tablet Take 2 tablets (1,000 mg total) by mouth every 8 (eight) hours. 30 tablet 0 11/28/2022   alendronate (FOSAMAX) 70 MG tablet TAKE 1 TABLET (70 MG TOTAL) BY MOUTH EVERY 7 DAYS WITH FULL GLASS WATER ON EMPTY STOMACH 12 tablet 0 11/28/2022   Ascorbic Acid (VITAMIN C) 1000 MG tablet Take 1,000 mg by mouth daily.   11/28/2022   atorvastatin (LIPITOR) 10 MG tablet Take 1 tablet (10 mg total) by mouth daily. 90 tablet 1 11/28/2022   [employer] FIBER GUMMIES PO Take by mouth.   11/28/2022   diphenhydramine-acetaminophen (TYLENOL PM) 25-500 MG TABS tablet Take 1 tablet by mouth at bedtime as needed.   11/28/2022   FLUoxetine (PROZAC) 20 MG tablet Take 1 tablet (20 mg total) by mouth daily. 90 tablet 1 11/28/2022   hydrochlorothiazide (HYDRODIURIL) 12.5 MG tablet Take 1 tablet (12.5 mg total) by mouth daily. 90 tablet 1 11/28/2022   KRILL OIL OMEGA-3 PO Take by mouth. Krill oil 1600 mg   11/28/2022   Lactobacillus (PROBIOTIC ACIDOPHILUS PO) Take by mouth.   11/28/2022   METAMUCIL FIBER PO Take by mouth.   11/28/2022   OVER THE COUNTER MEDICATION Garcinia Cambogia Extract 1600 mg  Calcium 222 mg   11/28/2022    valACYclovir (VALTREX) 1000 MG tablet Take 1,000 mg by mouth daily. Dr Corley/ Derm   11/28/2022   HYDROcodone-acetaminophen (NORCO/VICODIN) 5-325 MG tablet Take 0.5-1 tablets by mouth every 8 (eight) hours as needed for severe pain (breakthrough pain). (Patient not taking: Reported on 10/17/2022) 15 tablet 0    metoprolol tartrate (LOPRESSOR) 100 MG tablet Take 1 tablet (100 mg total) by mouth once for 1 dose. Take 2 hours before the test. 1 tablet 0      No Known Allergies   Past Medical History:  Diagnosis Date   Arthritis    DDD (degenerative disc disease), lumbar    HLD (hyperlipidemia)    Hypertension    New onset left bundle branch block (LBBB) 07/31/2022   a.) noted on preoperative ECG; (+) associated retrosternal chest pressured for ~ 10 days.    Review of systems:  Otherwise negative.    Physical Exam  Gen: Alert, oriented. Appears stated age.  HEENT: PERRLA. Lungs: No respiratory distress CV: RRR Abd: soft, benign, no masses Ext: No edema    Planned procedures: Proceed with colonoscopy. The patient understands the nature of the planned procedure, indications, risks, alternatives and potential complications including but not limited to bleeding, infection, perforation, damage to internal organs and possible oversedation/side effects from anesthesia. The patient agrees and gives consent to proceed.  Please refer to procedure notes for findings, recommendations and patient disposition/instructions.     Debbie Bill, MD Debbie Montgomery  Gastroenterology

## 2022-11-29 NOTE — Transfer of Care (Signed)
Immediate Anesthesia Transfer of Care Note  Patient: Debbie Montgomery  Procedure(s) Performed: COLONOSCOPY WITH PROPOFOL  Patient Location: PACU  Anesthesia Type:General  Level of Consciousness: drowsy and patient cooperative  Airway & Oxygen Therapy: Patient Spontanous Breathing  Post-op Assessment: Report given to RN and Post -op Vital signs reviewed and stable  Post vital signs: stable  Last Vitals:  Vitals Value Taken Time  BP    Temp    Pulse 70 11/29/22 0911  Resp 13 11/29/22 0911  SpO2 96 % 11/29/22 0911  Vitals shown include unvalidated device data.  Last Pain:  Vitals:   11/29/22 0750  TempSrc: Temporal  PainSc: 0-No pain         Complications: No notable events documented.

## 2022-11-29 NOTE — Anesthesia Preprocedure Evaluation (Signed)
Anesthesia Evaluation  Patient identified by MRN, date of birth, ID band Patient awake    Reviewed: Allergy & Precautions, NPO status , Patient's Chart, lab work & pertinent test results  Airway Mallampati: II  TM Distance: >3 FB Neck ROM: full    Dental  (+) Teeth Intact   Pulmonary neg pulmonary ROS, Patient abstained from smoking., former smoker   Pulmonary exam normal breath sounds clear to auscultation       Cardiovascular Exercise Tolerance: Good hypertension, Pt. on medications negative cardio ROS Normal cardiovascular exam+ dysrhythmias  Rhythm:Regular Rate:Normal     Neuro/Psych   Anxiety     negative neurological ROS  negative psych ROS   GI/Hepatic negative GI ROS, Neg liver ROS,,,  Endo/Other  negative endocrine ROS    Renal/GU negative Renal ROS  negative genitourinary   Musculoskeletal   Abdominal Normal abdominal exam  (+)   Peds negative pediatric ROS (+)  Hematology negative hematology ROS (+)   Anesthesia Other Findings Past Medical History: No date: Arthritis No date: DDD (degenerative disc disease), lumbar No date: HLD (hyperlipidemia) No date: Hypertension 07/31/2022: New onset left bundle branch block (LBBB)     Comment:  a.) noted on preoperative ECG; (+) associated               retrosternal chest pressured for ~ 10 days.  Past Surgical History: No date: ABDOMINAL HYSTERECTOMY 1995: bladder lift No date: COLONOSCOPY 2005: EYE SURGERY     Comment:  lasix 04/09/2022: IR KYPHO LUMBAR INC FX REDUCE BONE BX UNI/BIL CANNULATION  INC/IMAGING 04/02/2022: IR RADIOLOGIST EVAL & MGMT 04/19/2022: IR RADIOLOGIST EVAL & MGMT No date: LASER ABLATION; Right No date: TONSILLECTOMY AND ADENOIDECTOMY     Comment:  as a child 08/12/2022: TOTAL HIP ARTHROPLASTY; Right     Comment:  Procedure: TOTAL HIP ARTHROPLASTY ANTERIOR APPROACH;                Surgeon: Reinaldo Berber, MD;  Location: [facility]  ORS;                Service: Orthopedics;  Laterality: Right; No date: TUBAL LIGATION  BMI    Body Mass Index: 21.38 kg/m      Reproductive/Obstetrics negative OB ROS                             Anesthesia Physical Anesthesia Plan  ASA: 2  Anesthesia Plan: General   Post-op Pain Management:    Induction: Intravenous  PONV Risk Score and Plan: Propofol infusion and TIVA  Airway Management Planned: Natural Airway  Additional Equipment:   Intra-op Plan:   Post-operative Plan:   Informed Consent: I have reviewed the patients History and Physical, chart, labs and discussed the procedure including the risks, benefits and alternatives for the proposed anesthesia with the patient or authorized representative who has indicated his/her understanding and acceptance.     Dental Advisory Given  Plan Discussed with: CRNA and Surgeon  Anesthesia Plan Comments:        Anesthesia Quick Evaluation

## 2022-11-29 NOTE — Interval H&P Note (Signed)
History and Physical Interval Note:  11/29/2022 8:37 AM  Debbie Montgomery  has presented today for surgery, with the diagnosis of h/o colon polyps.  The various methods of treatment have been discussed with the patient and family. After consideration of risks, benefits and other options for treatment, the patient has consented to  Procedure(s): COLONOSCOPY WITH PROPOFOL (N/A) as a surgical intervention.  [name] history has been reviewed, patient examined, no change in status, stable for surgery.  I have reviewed the patient's chart and labs.  Questions were answered to the patient's satisfaction.     Regis Bill  Ok to proceed with colonoscopy

## 2022-11-29 NOTE — Anesthesia Postprocedure Evaluation (Signed)
Anesthesia Post Note  Patient: Debbie Montgomery  Procedure(s) Performed: COLONOSCOPY WITH PROPOFOL  Patient location during evaluation: PACU Anesthesia Type: General Level of consciousness: awake Pain management: pain level not controlled Vital Signs Assessment: post-procedure vital signs reviewed and stable Respiratory status: spontaneous breathing and nonlabored ventilation Cardiovascular status: stable Anesthetic complications: no   There were no known notable events for this encounter.   Last Vitals:  Vitals:   11/29/22 0921 11/29/22 0931  BP: 113/64 105/73  Pulse: 69 60  Resp: 15 18  Temp:    SpO2: 96% 98%    Last Pain:  Vitals:   11/29/22 0931  TempSrc:   PainSc: 0-No pain                 VAN STAVEREN,[name]

## 2022-11-29 NOTE — Op Note (Signed)
[facility] Patient Name: Debbie Montgomery Procedure Date: 11/29/2022 8:33 AM MRN: [id number] Account #: [id number] Date of Birth: 05/14/1946 Admit Type: Outpatient Age: 77 Room: [facility] ENDO ROOM 3 Gender: Female Note Status: Supervisor Override Instrument Name: Peds Colonoscope [id number] Procedure:             Colonoscopy Indications:           Change in bowel habits, Constipation, personal history                         of colon polyps Providers:             Eather Colas MD, MD Referring MD:          Duanne Limerick, MD (Referring MD) Medicines:             Monitored Anesthesia Care Complications:         No immediate complications. Procedure:             Pre-Anesthesia Assessment:                        - Prior to the procedure, a History and Physical was                         performed, and patient medications and allergies were                         reviewed. The patient is competent. The risks and                         benefits of the procedure and the sedation options and                         risks were discussed with the patient. All questions                         were answered and informed consent was obtained.                         Patient identification and proposed procedure were                         verified by the physician, the nurse, the                         anesthesiologist, the anesthetist and the technician                         in the endoscopy suite. Mental Status Examination:                         alert and oriented. Airway Examination: normal                         oropharyngeal airway and neck mobility. Respiratory                         Examination: clear to auscultation. CV Examination:  normal. Prophylactic Antibiotics: The patient does not                         require prophylactic antibiotics. Prior                         Anticoagulants: The patient has taken no  anticoagulant                         or antiplatelet agents. ASA Grade Assessment: II - A                         patient with mild systemic disease. After reviewing                         the risks and benefits, the patient was deemed in                         satisfactory condition to undergo the procedure. The                         anesthesia plan was to use monitored anesthesia care                         (MAC). Immediately prior to administration of                         medications, the patient was re-assessed for adequacy                         to receive sedatives. The heart rate, respiratory                         rate, oxygen saturations, blood pressure, adequacy of                         pulmonary ventilation, and response to care were                         monitored throughout the procedure. The physical                         status of the patient was re-assessed after the                         procedure.                        After obtaining informed consent, the colonoscope was                         passed under direct vision. Throughout the procedure,                         the patient's blood pressure, pulse, and oxygen                         saturations were monitored continuously. The  Colonoscope was introduced through the anus and                         advanced to the the terminal ileum. The colonoscopy                         was somewhat difficult due to restricted mobility of                         the colon. Successful completion of the procedure was                         aided by withdrawing the scope and replacing with the                         pediatric colonoscope. The patient tolerated the                         procedure well. The quality of the bowel preparation                         was good. The terminal ileum, ileocecal valve,                         appendiceal orifice, and rectum were  photographed. Findings:      The perianal and digital rectal examinations were normal.      The terminal ileum appeared normal.      Multiple large-mouthed and small-mouthed diverticula were found in the       sigmoid colon, descending colon, splenic flexure, transverse colon,       hepatic flexure and ascending colon.      Internal hemorrhoids were found during retroflexion. The hemorrhoids       were Grade I (internal hemorrhoids that do not prolapse).      The exam was otherwise without abnormality on direct and retroflexion       views. Impression:            - The examined portion of the ileum was normal.                        - Diverticulosis in the sigmoid colon, in the                         descending colon, at the splenic flexure, in the                         transverse colon, at the hepatic flexure and in the                         ascending colon.                        - Internal hemorrhoids.                        - The examination was otherwise normal on direct and                         retroflexion views.                        -  No specimens collected. Recommendation:        - Discharge patient to home.                        - Resume previous diet.                        - Continue present medications.                        - Repeat colonoscopy is not recommended due to current                         age (23 years or older) for surveillance.                        - Return to referring physician as previously                         scheduled. Procedure Code(s):     --- Professional ---                        [phone], Colonoscopy, flexible; diagnostic, including                         collection of specimen(s) by brushing or washing, when                         performed (separate procedure) Diagnosis Code(s):     --- Professional ---                        K64.0, First degree hemorrhoids                        R19.4, Change in bowel habit                         K59.00, Constipation, unspecified                        K57.30, Diverticulosis of large intestine without                         perforation or abscess without bleeding CPT copyright 2022 American [employer] Association. All rights reserved. The codes documented in this report are preliminary and upon coder review may  be revised to meet current compliance requirements. Eather Colas MD, MD 11/29/2022 9:14:45 AM Number of Addenda: 0 Note Initiated On: 11/29/2022 8:33 AM Scope Withdrawal Time: 0 hours 6 minutes 43 seconds  Total Procedure Duration: 0 hours 21 minutes 13 seconds  Estimated Blood Loss:  Estimated blood loss: none.      [facility]

## 2022-12-02 ENCOUNTER — Encounter: Payer: Self-pay | Admitting: Gastroenterology

## 2022-12-16 NOTE — Progress Notes (Unsigned)
Referring Physician:  Reinaldo Berber, MD [address] [place],  [place] [address]  Primary Physician:  Duanne Limerick, MD  History of Present Illness: 12/18/2022 Ms. Debbie Montgomery has a history of anxiety, HTN, hyperlipidemia.   She has been seeing PMR/[facility]. History of L1 kyphoplasty on 04/09/22 by IR.   Right hip replacement on 08/2022.   She has constant LBP/right buttock pain with right posterior leg pain to her foot x years. No left leg pain. She notes numbness in right foot. Some improvement with heating pad. Pain is worse with prolonged standing. She notes some weakness in right leg.    Bowel/Bladder Dysfunction: none  She vapes with nicotine.   Conservative measures:  Physical therapy: has not participated  Multimodal medical therapy including regular antiinflammatories: tylenol, norco  Injections:  11/06/2022: RFA to the bilateral L4-5 and L5-S1 facet joints - helped with low back pain 10/01/2022: Right L4-5 and right L5-S1 transforaminal ESI (50% relief) 08/12/2022: right hip replacement Dr. Audelia Acton 05/02/2022: Right hip joint injection under fluoroscopy (3 weeks of good relief and then return of pain) 01/25/2022: RFA to the bilateral L4-5 and L5-S1 facet joints (100% relief)  01/03/2022: MBB to the bilateral L4-5 and L5-S1 facet joints (8/10 to 0/10) 12/19/2021: MBB to the bilateral L4-5 and L5-S1 facet joints (8/10 to 0/10) 10/25/2021: Right L4-5 and right L5-S1 transforaminal ESI (50% relief) 06/07/2020: Right L4-5 and right L5-S1 transforaminal ESI (50% relief) 01/31/2021: Right L4-5 and right L5-S1 transforaminal ESI (95% relief)   Past Surgery: L1 kyphoplasty on 04/09/22  Debbie Montgomery has no symptoms of cervical myelopathy.  The symptoms are causing a significant impact on the patient's life.   Review of Systems:  A 10 point review of systems is negative, except for the pertinent positives and negatives detailed in the HPI.  Past  Medical History: Past Medical History:  Diagnosis Date   Arthritis    DDD (degenerative disc disease), lumbar    HLD (hyperlipidemia)    Hypertension    New onset left bundle branch block (LBBB) 07/31/2022   a.) noted on preoperative ECG; (+) associated retrosternal chest pressured for ~ 10 days.    Past Surgical History: Past Surgical History:  Procedure Laterality Date   ABDOMINAL HYSTERECTOMY     bladder lift  1995   COLONOSCOPY     COLONOSCOPY WITH PROPOFOL N/A 11/29/2022   Procedure: COLONOSCOPY WITH PROPOFOL;  Surgeon: Regis Bill, MD;  Location: [facility] ENDOSCOPY;  Service: Endoscopy;  Laterality: N/A;   EYE SURGERY  2005   lasix   IR KYPHO LUMBAR INC FX REDUCE BONE BX UNI/BIL CANNULATION INC/IMAGING  04/09/2022   IR RADIOLOGIST EVAL & MGMT  04/02/2022   IR RADIOLOGIST EVAL & MGMT  04/19/2022   LASER ABLATION Right    TONSILLECTOMY AND ADENOIDECTOMY     as a child   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   Procedure: TOTAL HIP ARTHROPLASTY ANTERIOR APPROACH;  Surgeon: Reinaldo Berber, MD;  Location: [facility] ORS;  Service: Orthopedics;  Laterality: Right;   TUBAL LIGATION      Allergies: Allergies as of 12/18/2022   (No Known Allergies)    Medications: Outpatient Encounter Medications as of 12/18/2022  Medication Sig   terbinafine (LAMISIL) 250 MG tablet Take 250 mg by mouth daily.   acetaminophen (TYLENOL) 500 MG tablet Take 2 tablets (1,000 mg total) by mouth every 8 (eight) hours.   alendronate (FOSAMAX) 70 MG tablet TAKE 1 TABLET (70 MG TOTAL) BY MOUTH EVERY  7 DAYS WITH FULL GLASS WATER ON EMPTY STOMACH   Ascorbic Acid (VITAMIN C) 1000 MG tablet Take 1,000 mg by mouth daily.   atorvastatin (LIPITOR) 10 MG tablet Take 1 tablet (10 mg total) by mouth daily.   [employer] FIBER GUMMIES PO Take by mouth.   diphenhydramine-acetaminophen (TYLENOL PM) 25-500 MG TABS tablet Take 1 tablet by mouth at bedtime as needed.   FLUoxetine (PROZAC) 20 MG tablet Take 1 tablet (20 mg  total) by mouth daily.   hydrochlorothiazide (HYDRODIURIL) 12.5 MG tablet Take 1 tablet (12.5 mg total) by mouth daily.   KRILL OIL OMEGA-3 PO Take by mouth. Krill oil 1600 mg   Lactobacillus (PROBIOTIC ACIDOPHILUS PO) Take by mouth.   METAMUCIL FIBER PO Take by mouth.   OVER THE COUNTER MEDICATION Garcinia Cambogia Extract 1600 mg  Calcium 222 mg   valACYclovir (VALTREX) 1000 MG tablet Take 1,000 mg by mouth daily. Dr Charlsie Merles Derm   [DISCONTINUED] HYDROcodone-acetaminophen (NORCO/VICODIN) 5-325 MG tablet Take 0.5-1 tablets by mouth every 8 (eight) hours as needed for severe pain (breakthrough pain). (Patient not taking: Reported on 10/17/2022)   [DISCONTINUED] metoprolol tartrate (LOPRESSOR) 100 MG tablet Take 1 tablet (100 mg total) by mouth once for 1 dose. Take 2 hours before the test.   No facility-administered encounter medications on file as of 12/18/2022.    Social History: Social History   Tobacco Use   Smoking status: Former   Smokeless tobacco: Never   Tobacco comments:    Uses vape w/ nicotine  Vaping Use   Vaping status: Every Day  Substance Use Topics   Alcohol use: Yes    Alcohol/week: 1.0 standard drink of alcohol    Types: 1 Glasses of wine per week    Comment: ocassionally   Drug use: Never    Family Medical History: Family History  Problem Relation Age of Onset   Hypertension Mother    Heart disease Father    Heart attack Maternal Grandmother    Stroke Maternal Grandmother     Physical Examination: Vitals:   12/18/22 1003  BP: 118/82    General: Patient is well developed, well nourished, calm, collected, and in no apparent distress. Attention to examination is appropriate.  Respiratory: Patient is breathing without any difficulty.   NEUROLOGICAL:     Awake, alert, oriented to person, place, and time.  Speech is clear and fluent. Fund of knowledge is appropriate.   Cranial Nerves: Pupils equal round and reactive to light.  Facial tone is  symmetric.    No posterior lumbar tenderness.   No abnormal lesions on exposed skin.   Strength: Side Biceps Triceps Deltoid Interossei Grip Wrist Ext. Wrist Flex.  R 5 5 5 5 5 5 5   L 5 5 5 5 5 5 5    Side Iliopsoas Quads Hamstring PF DF EHL  R 5 5 5 5 5 5   L 5 5 5 5 5 5    Reflexes are 2+ and symmetric at the biceps, brachioradialis, patella and achilles.   Hoffman's is absent.  Clonus is not present.   Bilateral upper and lower extremity sensation is intact to light touch.     No pain with IR/ER of both hips.   Gait is normal.    Medical Decision Making  Imaging: MRI of lumbar spine dated 03/26/22:  FINDINGS: Segmentation: There is transitional anatomy. In keeping with prior numbering convention the compression deformity is localized to the L1 vertebral body. There is lumbarization of S1 with the  last well-formed disc space at S1-S2.   Alignment:  Trace anterolisthesis of L4 on L5.   Vertebrae: Redemonstrated is a compression deformity at the L1 vertebral body level which demonstrates T2/stir hyperintense signal abnormality, which is suggestive of vertebral body edema, which can be seen in the setting of an acute process. There is mild posterior bulging of the inferior endplate without significant spinal canal stenosis. There is no evidence of an epidural hematoma.   Conus medullaris and cauda equina: Conus extends to the L1 level. Conus and cauda equina appear normal.   Paraspinal and other soft tissues: There is a T2 hyperintense lesion of the inferior pole of the right kidney, most likely a small renal cyst which does not require further workup.   Disc levels:   T11-T12: Only imaged in the sagittal plane. No evidence of spinal canal or neural foraminal stenosis.   T12-L1: No disc bulge. No significant facet degenerative change. No spinal canal stenosis. No neural foraminal stenosis.   L1-L2: Mild bilateral facet degenerative change. No spinal  canal stenosis. No neural foraminal stenosis.   L2-L3: Mild bilateral facet degenerative change. Mild ligamentum flavum hypertrophy. No spinal canal stenosis. No neural foraminal stenosis.   L3-L4: Mild bilateral facet degenerative change. Minimal disc bulge. No spinal canal stenosis. No neural foraminal stenosis.   L4-L5: Moderate bilateral facet degenerative change. Ligamentum flavum hypertrophy. Right paracentral disc protrusion. There is mild narrowing of the lateral right lateral recess. No overall spinal canal stenosis. Is mild right neural foraminal stenosis.   L5-S1: Severe bilateral facet degenerative change. Grade 1 anterolisthesis. Minimal disc bulge. No spinal canal stenosis. Mild left neural foraminal stenosis.   S1-S2: Mild bilateral facet degenerative change. No spinal canal stenosis. No neural foraminal stenosis.   IMPRESSION: 1. In keeping with prior numbering convention, the compression deformity is localized to the L1 vertebral body. There is vertebral body edema associated with the L1 vertebral body, which can be seen in the setting of an acute on chronic process. There is mild posterior bulging of the inferior endplate without significant spinal canal stenosis. No evidence of an epidural hematoma. 2. Multilevel degenerative changes as above without high-grade spinal canal or neural foraminal stenosis.     Electronically Signed   By: Lorenza Cambridge M.D.   On: 03/27/2022 11:09   Lumbar xrays dated 03/20/22:  FINDINGS: L1 compression fracture with significant anterior wedging. This is estimated at 60% when compared to the normal vertebral body on 11/03/2019. This has increased slightly since the recent films from 02/14/2022 where it was approximately 50%. Stable slight retropulsion of the posteroinferior aspect of the vertebral body.   No new/acute findings. Stable degenerative lumbar spondylosis with disc disease and facet disease in the lower lumbar  spine.   IMPRESSION: 1. 60% L1 compression fracture, slightly increased compared to the recent films from 02/14/2022 where it was approximately 50%. 2. No new/acute findings.     Electronically Signed   By: Rudie Meyer M.D.   On: 03/22/2022 08:48  I have personally reviewed the images and agree with the above interpretation.  Assessment and Plan: Ms. Strider is a pleasant 77 y.o. female has history of L1 kyphoplasty on 04/09/22 by IR and right hip replacement 08/2022.   She has constant LBP/right buttock pain with right posterior leg pain to her foot x years. No left leg pain. She notes numbness in right foot. Pain appears to be worse in last few months.   She has known transitional anatomy with  right sided disc at L4-L5, right lateral recess stenosis, and mild right foraminal stenosis. 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   Treatment options discussed with patient and following plan made:   - Update lumbar MRI scan to evaluate increased pain and lumbar radiculopathy. No improvement with time, medications, or injections.  - Flexion/extension xrays of lumbar spine ordered. - Will schedule phone visit to review MRI results once I get them back.  - Discussed that we would need to do PT prior to any possible surgery.   I spent a total of 30 minutes in face-to-face and non-face-to-face activities related to this patient's care today including review of outside records, review of imaging, review of symptoms, physical exam, discussion of differential diagnosis, discussion of treatment options, and documentation.   Drake Leach PA-C Dept. of Neurosurgery

## 2022-12-18 ENCOUNTER — Encounter: Payer: Self-pay | Admitting: Orthopedic Surgery

## 2022-12-18 ENCOUNTER — Ambulatory Visit: Payer: Medicare Other | Admitting: Orthopedic Surgery

## 2022-12-18 VITALS — BP 118/82 | Ht 68.0 in | Wt 141.0 lb

## 2022-12-18 DIAGNOSIS — M5116 Intervertebral disc disorders with radiculopathy, lumbar region: Secondary | ICD-10-CM

## 2022-12-18 DIAGNOSIS — Z8781 Personal history of (healed) traumatic fracture: Secondary | ICD-10-CM | POA: Diagnosis not present

## 2022-12-18 DIAGNOSIS — M48061 Spinal stenosis, lumbar region without neurogenic claudication: Secondary | ICD-10-CM

## 2022-12-18 DIAGNOSIS — M47816 Spondylosis without myelopathy or radiculopathy, lumbar region: Secondary | ICD-10-CM

## 2022-12-18 DIAGNOSIS — M5136 Other intervertebral disc degeneration, lumbar region: Secondary | ICD-10-CM

## 2022-12-18 DIAGNOSIS — M5416 Radiculopathy, lumbar region: Secondary | ICD-10-CM

## 2022-12-18 NOTE — Patient Instructions (Signed)
It was so nice to see you today. Thank you so much for coming in.    You have some wear and tear in your back and this is likely contributing to your pain.   I want to get an MRI of your lower back to look into things further. We will get this approved through your insurance and [facility] will call you to schedule the appointment.   I ordered xrays of your lower back as well. You can get these when you get the MRI.   [facility] is (building with the white pillars) off of [address]. The address is [address], [place], [place] [address].   When you have the imaging done, it will take 5-7 days for me to get the results. Once I have them, we will call you to schedule a phone visit.   Please do not hesitate to call if you have any questions or concerns. You can also message me in MyChart.   If you have not heard back about the MRI in the next week, please call the office so we can help you get it scheduled.   Drake Leach PA-C [phone]

## 2022-12-26 ENCOUNTER — Ambulatory Visit
Admission: RE | Admit: 2022-12-26 | Discharge: 2022-12-26 | Disposition: A | Discharge status: Home / Self Care | Payer: Medicare Other | Source: Ambulatory Visit | Attending: Orthopedic Surgery | Admitting: Orthopedic Surgery

## 2022-12-26 DIAGNOSIS — M51369 Other intervertebral disc degeneration, lumbar region without mention of lumbar back pain or lower extremity pain: Secondary | ICD-10-CM

## 2022-12-26 DIAGNOSIS — M5416 Radiculopathy, lumbar region: Secondary | ICD-10-CM

## 2022-12-26 DIAGNOSIS — M5136 Other intervertebral disc degeneration, lumbar region: Secondary | ICD-10-CM | POA: Insufficient documentation

## 2022-12-26 DIAGNOSIS — Z8781 Personal history of (healed) traumatic fracture: Secondary | ICD-10-CM | POA: Diagnosis present

## 2022-12-26 DIAGNOSIS — M47816 Spondylosis without myelopathy or radiculopathy, lumbar region: Secondary | ICD-10-CM

## 2023-01-23 NOTE — Progress Notes (Signed)
Telephone Visit- Progress Note: Referring Physician:  Duanne Limerick, MD [address] [place],  [place] [address]  Primary Physician:  Duanne Limerick, MD  This visit was performed via telephone.  Patient location: home Provider location: office  I spent a total of 10 minutes non-face-to-face activities for this visit on the date of this encounter including review of current clinical condition and response to treatment.    Patient has given verbal consent to this telephone visits and we reviewed the limitations of a telephone visit. Patient wishes to proceed.    Chief Complaint:  review lumbar MRI and xrays  History of Present Illness: Martha Glende is a 77 y.o. female has a history of anxiety, HTN, hyperlipidemia.    She has been seeing PMR/[facility]. History of L1 kyphoplasty on 04/09/22 by IR.    Right hip replacement on 08/2022.   Last seen by me on 12/18/22 for LBP/right buttock pain and right leg pain. 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   Phone visit scheduled to review MRI and lumbar xray results.   She started PT and is feeling better!  She now has intermittent LBP/right buttock pain with intermittent right posterior leg pain to her foot. No left leg pain. She has some tingling in her right leg. No numbness or weakness in her right leg. She has seen great improvement with dry needling at PT.     Bowel/Bladder Dysfunction: none   She vapes with nicotine.    Conservative measures:  Physical therapy: going to [facility]- has done 3 weeks Multimodal medical therapy including regular antiinflammatories: tylenol, norco  Injections:  11/06/2022: RFA to the bilateral L4-5 and L5-S1 facet joints - helped with low back pain 10/01/2022: Right L4-5 and right L5-S1 transforaminal ESI (50% relief) 08/12/2022: right hip  replacement Dr. Audelia Acton 05/02/2022: Right hip joint injection under fluoroscopy (3 weeks of good relief and then return of pain) 01/25/2022: RFA to the bilateral L4-5 and L5-S1 facet joints (100% relief)  01/03/2022: MBB to the bilateral L4-5 and L5-S1 facet joints (8/10 to 0/10) 12/19/2021: MBB to the bilateral L4-5 and L5-S1 facet joints (8/10 to 0/10) 10/25/2021: Right L4-5 and right L5-S1 transforaminal ESI (50% relief) 06/07/2020: Right L4-5 and right L5-S1 transforaminal ESI (50% relief) 01/31/2021: Right L4-5 and right L5-S1 transforaminal ESI (95% relief)    Past Surgery: L1 kyphoplasty on 04/09/22   Burman Nieves Mahr has no symptoms of cervical myelopathy.   The symptoms are causing a significant impact on the patient's life.   Exam: No exam done as this was a telephone encounter.     Imaging: Lumbar MRI dated 12/26/22:  FINDINGS: Segmentation: Transitional lumbosacral anatomy. In keeping with prior numbering convention, the compression deformities localized to the L1 vertebral body. Lumbarization of S1 with the lowest well-formed disc space labeled as S1-S2.   Alignment: Grade 1 anterolisthesis of L4 on L5 and L5 on S1. Mild lumbar levocurvature.   Vertebrae: Chronic inferior endplate compression fracture of L1 status post cement augmentation. No residual bone marrow edema or progressive height loss. Remaining vertebral body heights are maintained without acute fracture. No evidence of discitis. No suspicious bone lesion.   Conus medullaris and cauda equina: Conus extends to the L1 level. Conus and cauda equina appear normal.   Paraspinal and other soft tissues: No acute abnormality.   Disc levels:  T12-L1: Unremarkable.   L1-L2: Mild inferior endplate retropulsion at L1. Unremarkable facet joints. No foraminal or canal stenosis.   L2-L3: Unremarkable disc. Mild facet hypertrophy. No foraminal or canal stenosis.   L3-L4: Minimal disc bulge. Mild facet  hypertrophy. No foraminal or canal stenosis.   L4-L5: Disc bulge and endplate spurring, eccentric to the right. Mild bilateral facet hypertrophy. Mild right subarticular recess stenosis without canal stenosis. Mild right foraminal stenosis. Unchanged.   L5-S1: Anterolisthesis with disc uncovering. No significant disc protrusion. Advanced bilateral facet arthropathy. No foraminal or canal stenosis. Unchanged.   S1-S2: Transitional level.  No stenosis.   IMPRESSION: 1. Transitional lumbosacral anatomy.  See above discussion. 2. Chronic inferior endplate compression fracture of L1 status post cement augmentation. No residual bone marrow edema or progressive height loss. 3. Unchanged mild degenerative changes of the lumbar spine, most pronounced at L4-L5 where there is mild right subarticular recess stenosis and mild right foraminal stenosis. No canal stenosis at any level.     Electronically Signed   By: Duanne Guess D.O.   On: 01/05/2023 14:19   Lumbar xrays dated 12/26/22:  FINDINGS: Since the prior majus, vertebroplasty has been performed at the L1 level. The moderate compression fracture of L1 is stable in severity.   No other fractures.  No bone lesions.   Transitional lumbosacral vertebra designated S1.   Grade 1 anterolisthesis of L5 on S1 stable. Slight anterolisthesis of L4 on L5, also stable.   Moderate loss of disc height at L4-L5 and L5-S1, unchanged. Remaining disc spaces are relatively well preserved.   No subluxation with flexion or extension.   Mild curvature of the lumbar spine, convex the left, apex at L3-L4.   IMPRESSION: 1. No acute fracture or acute finding. 2. Vertebroplasty cement now stabilizes the moderate compression fracture of L1. No other fractures. 3. Degenerative changes as detailed stable from prior radiographs.     Electronically Signed   By: Amie Portland M.D.   On: 01/02/2023 11:33    I have personally reviewed the  images and agree with the above interpretation.  Assessment and Plan: Ms. Semler is a pleasant 77 y.o. female with a history of L1 kyphoplasty on 04/09/22 by IR and right hip replacement 08/2022.   She started PT and is feeling better!  She now has intermittent LBP/right buttock pain with intermittent right posterior leg pain to her foot. No left leg pain. No numbness or weakness in her right leg. She has seen great improvement with dry needling at PT.     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 No instability seen on her xrays.    Treatment options discussed with patient and following plan made:   - She will continue with PT for now as she is improving.  - If she gets worse, we can have her see one of the surgeons to discuss possible surgery options.  - Of note, DEXA 05/23/22 showed osteoporosis. She is taking fosamax.  - She will follow up with me prn at her request.   Drake Leach PA-C Neurosurgery

## 2023-01-24 ENCOUNTER — Encounter: Payer: Self-pay | Admitting: Orthopedic Surgery

## 2023-01-24 ENCOUNTER — Ambulatory Visit (INDEPENDENT_AMBULATORY_CARE_PROVIDER_SITE_OTHER): Payer: Medicare Other | Admitting: Orthopedic Surgery

## 2023-01-24 DIAGNOSIS — Z8781 Personal history of (healed) traumatic fracture: Secondary | ICD-10-CM | POA: Diagnosis not present

## 2023-01-24 DIAGNOSIS — M4316 Spondylolisthesis, lumbar region: Secondary | ICD-10-CM

## 2023-01-24 DIAGNOSIS — M5136 Other intervertebral disc degeneration, lumbar region: Secondary | ICD-10-CM

## 2023-01-24 DIAGNOSIS — M48061 Spinal stenosis, lumbar region without neurogenic claudication: Secondary | ICD-10-CM | POA: Diagnosis not present

## 2023-01-24 DIAGNOSIS — M47816 Spondylosis without myelopathy or radiculopathy, lumbar region: Secondary | ICD-10-CM

## 2023-01-24 DIAGNOSIS — M4726 Other spondylosis with radiculopathy, lumbar region: Secondary | ICD-10-CM

## 2023-01-24 DIAGNOSIS — M5416 Radiculopathy, lumbar region: Secondary | ICD-10-CM

## 2023-01-24 DIAGNOSIS — M5116 Intervertebral disc disorders with radiculopathy, lumbar region: Secondary | ICD-10-CM

## 2023-01-25 ENCOUNTER — Other Ambulatory Visit: Payer: Self-pay | Admitting: Family Medicine

## 2023-01-25 DIAGNOSIS — M816 Localized osteoporosis [Lequesne]: Secondary | ICD-10-CM

## 2023-03-13 IMAGING — MR MR LUMBAR SPINE W/O CM
5 series · 30 of 48 positions shown · non-contrast
Comparison: Radiography 11/03/2019

CLINICAL DATA: Worsening low back pain extending to the right leg
and foot over the last year.

EXAM:
MRI LUMBAR SPINE WITHOUT CONTRAST
TECHNIQUE: Multiplanar, multisequence MR imaging of the lumbar spine was
performed. No intravenous contrast was administered.

[Series 5: T2 · sagittal · 4.0mm · 0.81mm/px · 6 of 17 slices shown (1 of 2)]
[im 1/17]
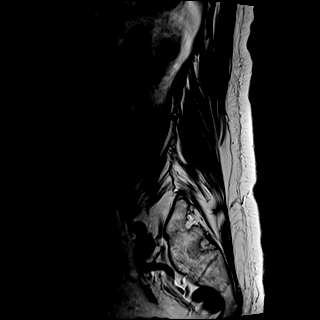
[im 4/17]
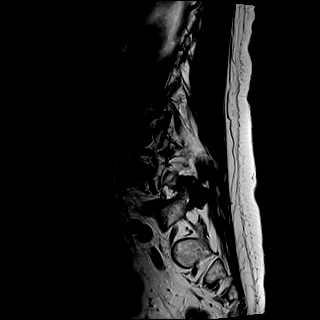
[im 7/17]
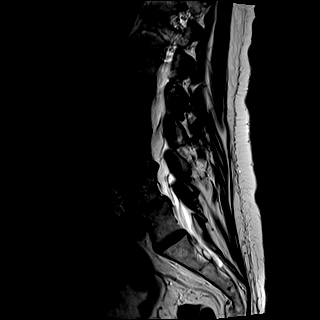
[im 10/17]
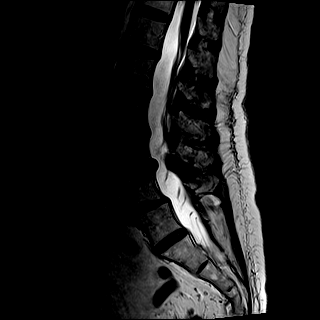
[im 13/17]
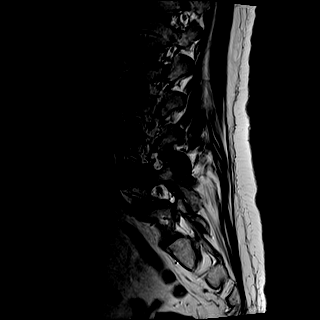
[im 17/17]
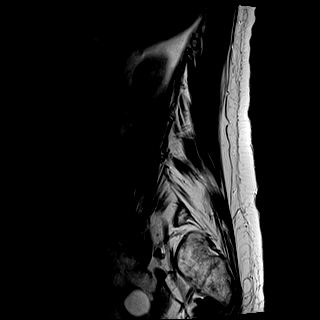

[Series 6: T1 · sagittal · 4.0mm · 0.81mm/px · 7 of 17 slices shown (1 of 2)]
[im 1/17]
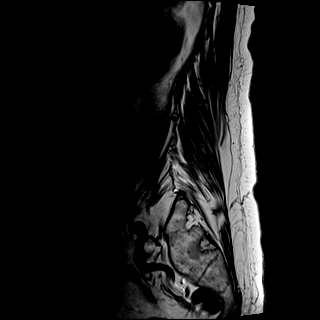
[im 3/17]
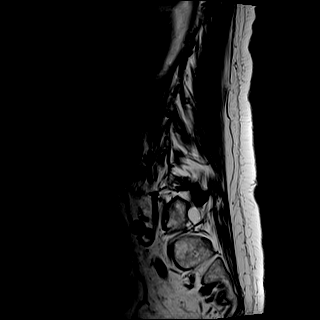
[im 6/17]
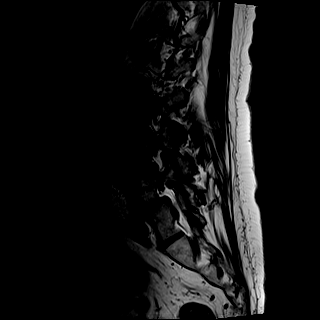
[im 9/17]
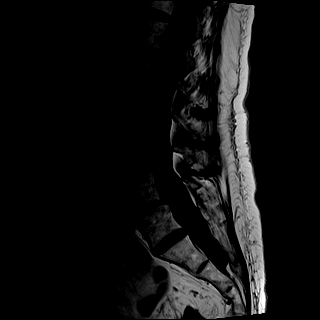
[im 11/17]
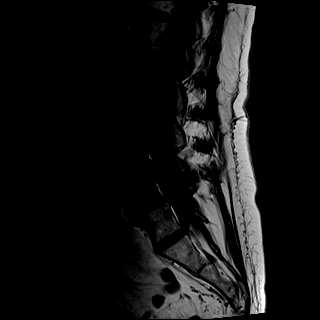
[im 14/17]
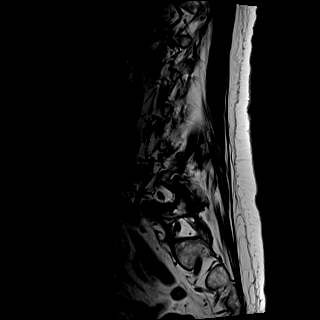
[im 17/17]
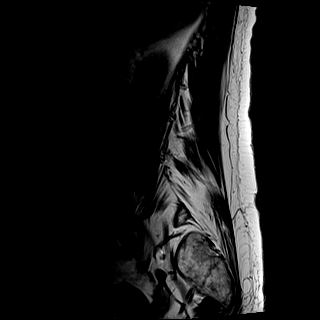

[Series 7: STIR · sagittal · 4.0mm · 0.41mm/px · 1 of 17 slices shown]
[im 1/17]
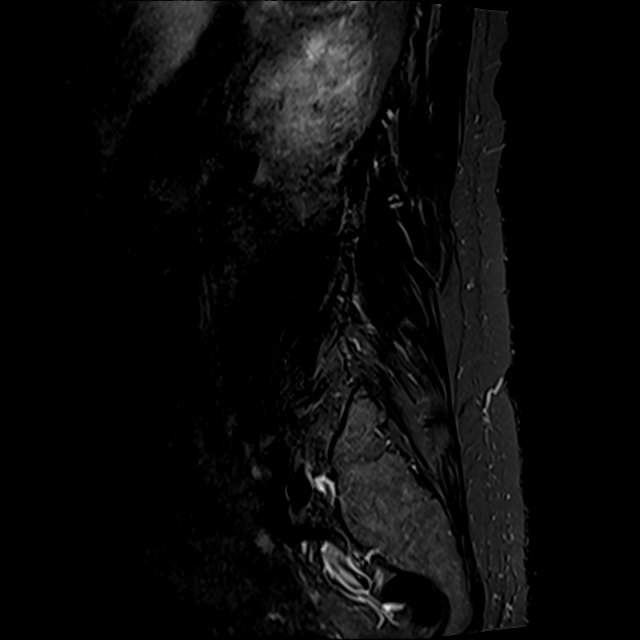

[Series 10: T2 · axial · 4.0mm · 0.78mm/px · z∈[-57,+157]mm · 8 of 35 slices shown (2 of 2)]
[im 1/35]
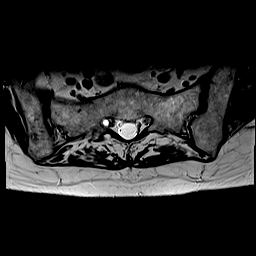
[im 6/35]
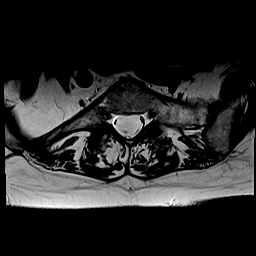
[im 11/35]
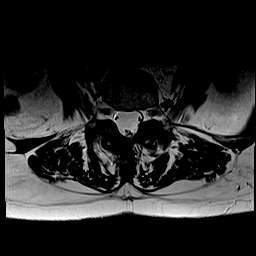
[im 16/35]
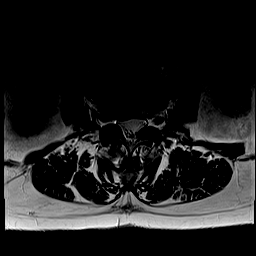
[im 19/35]
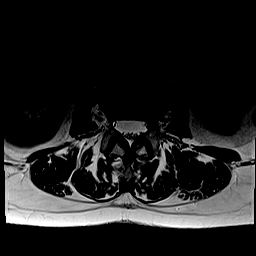
[im 24/35]
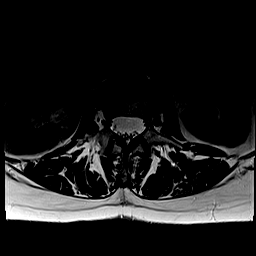
[im 29/35]
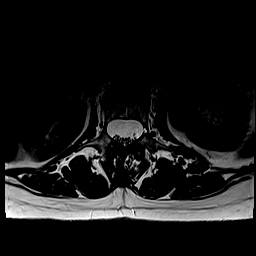
[im 35/35]
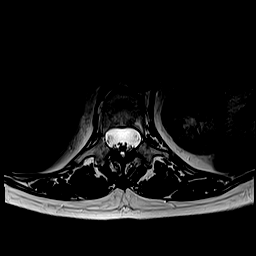

[Series 13: T1 · axial · 4.0mm · 0.39mm/px · z∈[-57,+157]mm · 8 of 35 slices shown (2 of 2)]
[im 1/35]
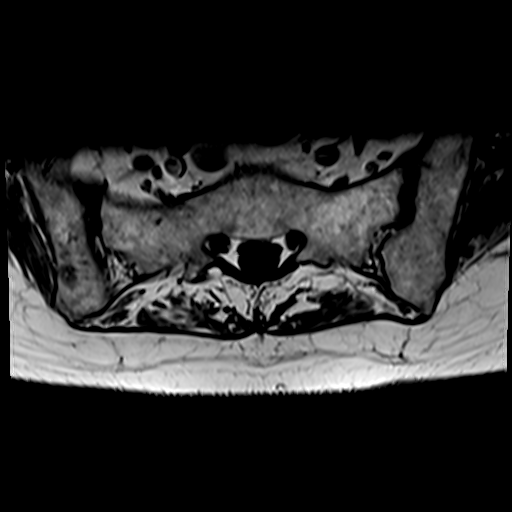
[im 6/35]
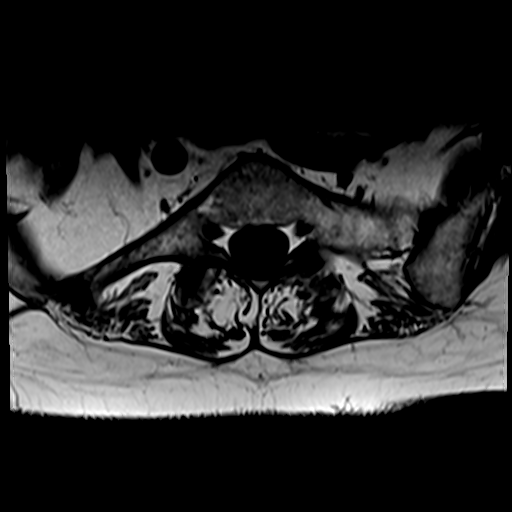
[im 11/35]
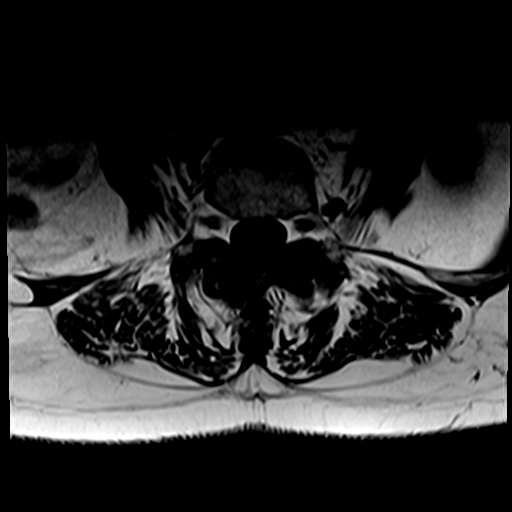
[im 16/35]
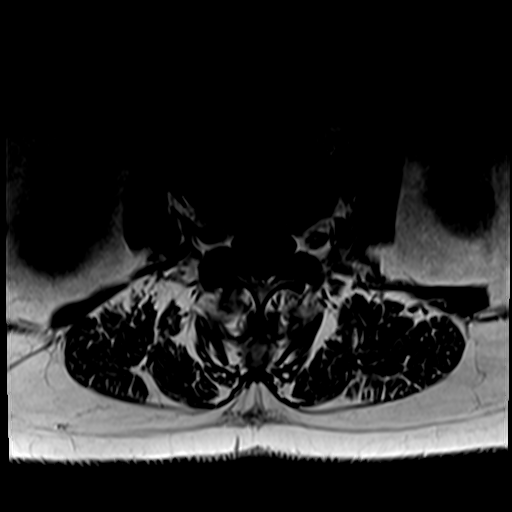
[im 19/35]
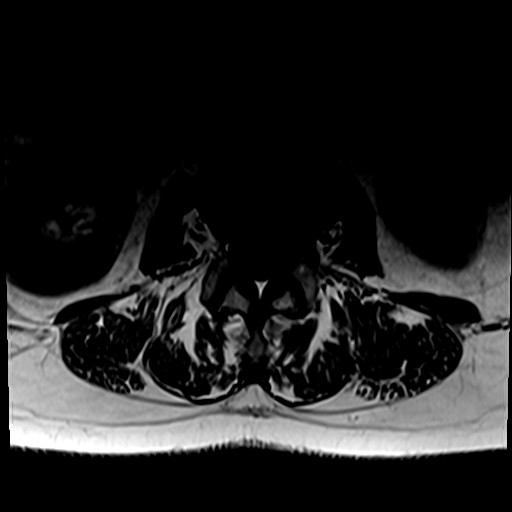
[im 24/35]
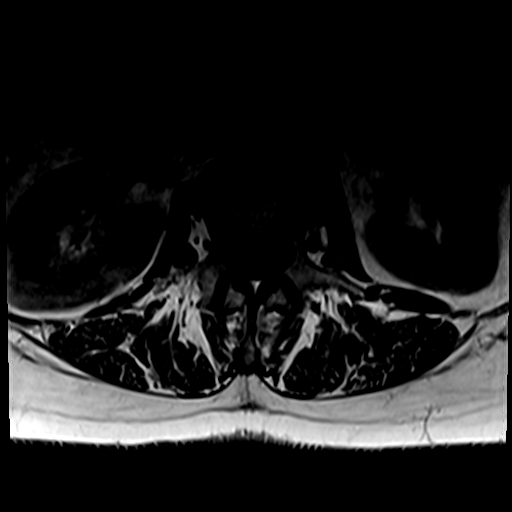
[im 29/35]
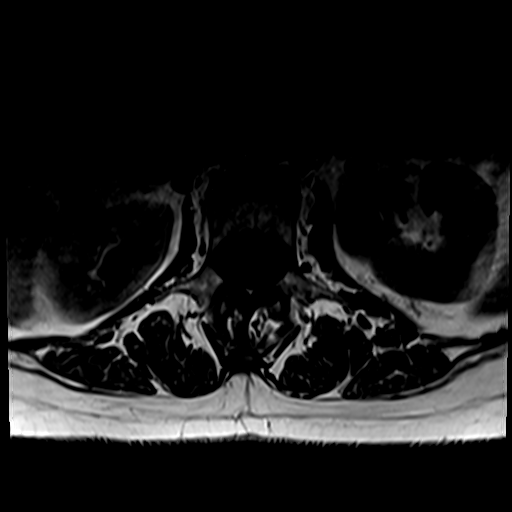
[im 35/35]
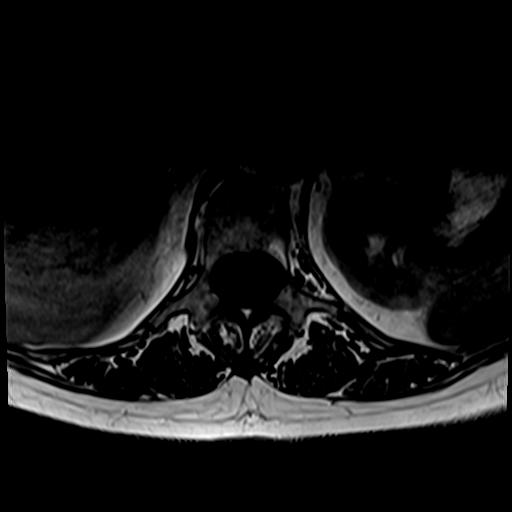

[30 of 48 positions shown; findings below may reference images not displayed]

FINDINGS: Segmentation: S1 has some transitional features. Correlation with
this numbering scheme would be important should intervention be
contemplated.

Alignment: Minimal scoliotic curvature. Degenerative anterolisthesis
at L5 S1-4 to 5 mm.

Vertebrae: No fracture or focal bone lesion. Discogenic endplate
marrow changes at L4-5 with some edema. This could be associated
with back pain.

Conus medullaris and cauda equina: Conus extends to the L1-2 level.
Conus and cauda equina appear normal.

Paraspinal and other soft tissues: Negative

Disc levels:

No abnormality at L2-3 or above.

L3-4: Minimal disc bulge.  No stenosis.

L4-5: Disc degeneration more pronounced on the right. Endplate
osteophytes and bulging of the disc. Facet and ligamentous
prominence. Mild narrowing of the right lateral recess and
intervertebral foramen on the right. There is some potential for the
right L4 nerve to be irritated. As noted above, discogenic endplate
edema is likely associated with regional back pain.

L5-S1: Chronic bilateral facet arthritis with 4-5 mm of
anterolisthesis. Chronic disc space narrowing. No compressive
stenosis of the canal or foramina.

S1-2: Transitional level which appears normal.
IMPRESSION: S1 is described as a transitional vertebra.

L4-5: Disc degeneration more pronounced on the right. Endplate
osteophytes and bulging of the disc. Mild narrowing of the right
lateral recess and intervertebral foramen on the right. Definite
neural compression is not demonstrated, but there is some potential
right L4 nerve could be irritated. Discogenic edematous changes of
the endplates would likely be associated with regional pain.

L5-S1: Chronic facet osteoarthritis with 4-5 mm of anterolisthesis.
Chronic disc degeneration with loss of disc height. No compressive
narrowing of the canal or foramina.

## 2023-04-19 ENCOUNTER — Other Ambulatory Visit: Payer: Self-pay | Admitting: Family Medicine

## 2023-04-19 DIAGNOSIS — E782 Mixed hyperlipidemia: Secondary | ICD-10-CM

## 2023-04-21 ENCOUNTER — Ambulatory Visit (INDEPENDENT_AMBULATORY_CARE_PROVIDER_SITE_OTHER): Payer: Medicare Other | Admitting: Family Medicine

## 2023-04-21 ENCOUNTER — Encounter: Payer: Self-pay | Admitting: Family Medicine

## 2023-04-21 VITALS — BP 116/78 | HR 76 | Resp 16 | Ht 68.0 in | Wt 142.0 lb

## 2023-04-21 DIAGNOSIS — F32A Depression, unspecified: Secondary | ICD-10-CM

## 2023-04-21 DIAGNOSIS — M816 Localized osteoporosis [Lequesne]: Secondary | ICD-10-CM | POA: Diagnosis not present

## 2023-04-21 DIAGNOSIS — E782 Mixed hyperlipidemia: Secondary | ICD-10-CM

## 2023-04-21 DIAGNOSIS — I10 Essential (primary) hypertension: Secondary | ICD-10-CM

## 2023-04-21 DIAGNOSIS — Z1159 Encounter for screening for other viral diseases: Secondary | ICD-10-CM

## 2023-04-21 DIAGNOSIS — F419 Anxiety disorder, unspecified: Secondary | ICD-10-CM

## 2023-04-21 MED ORDER — FLUOXETINE HCL 20 MG PO TABS
20.0000 mg | ORAL_TABLET | Freq: Every day | ORAL | 1 refills | Status: DC
Start: 2023-04-21 — End: 2023-09-18

## 2023-04-21 MED ORDER — HYDROCHLOROTHIAZIDE 12.5 MG PO TABS
12.5000 mg | ORAL_TABLET | Freq: Every day | ORAL | 1 refills | Status: DC
Start: 2023-04-21 — End: 2023-09-18

## 2023-04-21 MED ORDER — ALENDRONATE SODIUM 70 MG PO TABS
70.0000 mg | ORAL_TABLET | ORAL | 3 refills | Status: DC
Start: 2023-04-21 — End: 2023-09-18

## 2023-04-21 MED ORDER — ATORVASTATIN CALCIUM 10 MG PO TABS
10.0000 mg | ORAL_TABLET | Freq: Every day | ORAL | 1 refills | Status: DC
Start: 2023-04-21 — End: 2023-09-18

## 2023-04-21 NOTE — Progress Notes (Signed)
Date:  04/21/2023   Name:  Debbie Montgomery [facility]   DOB:  1945-08-14   MRN:  [id number]   Chief Complaint: Hypertension, Osteoporosis, and Anxiety  Hypertension This is a chronic problem. The current episode started more than 1 year ago. The problem has been gradually improving since onset. The problem is controlled. Associated symptoms include anxiety. Pertinent negatives include no blurred vision, chest pain, headaches, malaise/fatigue, neck pain, orthopnea, palpitations, peripheral edema, PND or shortness of breath. There are no associated agents to hypertension. Risk factors for coronary artery disease include dyslipidemia. Past treatments include diuretics. The current treatment provides moderate improvement. There are no compliance problems.  There is no history of angina, kidney disease, CAD/MI, CVA or left ventricular hypertrophy. There is no history of chronic renal disease, a hypertension causing med or renovascular disease.  Anxiety Presents for follow-up visit. Patient reports no chest pain, compulsions, confusion, excessive worry, irritability, muscle tension, palpitations, shortness of breath or suicidal ideas.    Hyperlipidemia This is a chronic problem. The current episode started more than 1 year ago. The problem is controlled. Recent lipid tests were reviewed and are normal. She has no history of chronic renal disease or diabetes. There are no known factors aggravating her hyperlipidemia. Pertinent negatives include no chest pain or shortness of breath. Current antihyperlipidemic treatment includes statins. The current treatment provides moderate improvement of lipids. There are no compliance problems.  Risk factors for coronary artery disease include dyslipidemia and hypertension.    Lab Results  Component Value Date   NA 134 (L) 08/13/2022   K 4.0 10/17/2022   CO2 28 08/13/2022   GLUCOSE 127 (H) 08/13/2022   BUN 16 08/13/2022   CREATININE 0.52 08/13/2022   CALCIUM 8.0  (L) 08/13/2022   EGFR 100 04/02/2022   GFRNONAA >60 08/13/2022   Lab Results  Component Value Date   CHOL 154 04/18/2022   HDL 79 04/18/2022   LDLCALC 65 04/18/2022   TRIG 47 04/18/2022   Lab Results  Component Value Date   TSH 1.25 06/25/2019   No results found for: "HGBA1C" Lab Results  Component Value Date   WBC 10.0 08/13/2022   HGB 10.0 (L) 08/13/2022   HCT 29.7 (L) 08/13/2022   MCV 95.2 08/13/2022   PLT 191 08/13/2022   Lab Results  Component Value Date   ALT 12 07/31/2022   AST 17 07/31/2022   ALKPHOS 28 (L) 07/31/2022   BILITOT 0.5 07/31/2022   No results found for: "25OHVITD2", "25OHVITD3", "VD25OH"   Review of Systems  Constitutional:  Negative for irritability and malaise/fatigue.  Eyes:  Negative for blurred vision and visual disturbance.  Respiratory:  Negative for choking, chest tightness, shortness of breath and wheezing.   Cardiovascular:  Negative for chest pain, palpitations, orthopnea and PND.  Gastrointestinal:  Negative for abdominal distention and blood in stool.  Endocrine: Negative for polydipsia and polyuria.  Genitourinary:  Negative for dyspareunia, flank pain, hematuria, vaginal discharge and vaginal pain.  Musculoskeletal:  Negative for neck pain.  Neurological:  Negative for headaches.  Psychiatric/Behavioral:  Negative for confusion and suicidal ideas.     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   Aphthous ulcer of mouth 08/11/2019  High blood pressure 07/01/2008    No Known Allergies  Past Surgical History:  Procedure Laterality Date   ABDOMINAL HYSTERECTOMY     bladder lift  1995   COLONOSCOPY     COLONOSCOPY WITH PROPOFOL N/A 11/29/2022    Procedure: COLONOSCOPY WITH PROPOFOL;  Surgeon: Regis Bill, MD;  Location: [facility] ENDOSCOPY;  Service: Endoscopy;  Laterality: N/A;   EYE SURGERY  2005   lasix   IR KYPHO LUMBAR INC FX REDUCE BONE BX UNI/BIL CANNULATION INC/IMAGING  04/09/2022   IR RADIOLOGIST EVAL & MGMT  04/02/2022   IR RADIOLOGIST EVAL & MGMT  04/19/2022   LASER ABLATION Right    TONSILLECTOMY AND ADENOIDECTOMY     as a child   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   Procedure: TOTAL HIP ARTHROPLASTY ANTERIOR APPROACH;  Surgeon: Reinaldo Berber, MD;  Location: [facility] ORS;  Service: Orthopedics;  Laterality: Right;   TUBAL LIGATION      Social History   Tobacco Use   Smoking status: Former   Smokeless tobacco: Never   Tobacco comments:    Uses vape w/ nicotine  Vaping Use   Vaping status: Every Day  Substance Use Topics   Alcohol use: Yes    Alcohol/week: 1.0 standard drink of alcohol    Types: 1 Glasses of wine per week    Comment: ocassionally   Drug use: Never     Medication list has been reviewed and updated.  Current Meds  Medication Sig   alendronate (FOSAMAX) 70 MG tablet TAKE 1 TABLET (70 MG TOTAL) BY MOUTH EVERY 7 DAYS WITH FULL GLASS WATER ON EMPTY STOMACH   Ascorbic Acid (VITAMIN C) 1000 MG tablet Take 1,000 mg by mouth daily.   Aspirin-Acetaminophen-Caffeine (EXCEDRIN PO) Take by mouth.   atorvastatin (LIPITOR) 10 MG tablet TAKE 1 TABLET BY MOUTH EVERY DAY   [employer] FIBER GUMMIES PO Take by mouth.   diphenhydramine-acetaminophen (TYLENOL PM) 25-500 MG TABS tablet Take 1 tablet by mouth at bedtime as needed.   FLUoxetine (PROZAC) 20 MG tablet Take 1 tablet (20 mg total) by mouth daily.   hydrochlorothiazide (HYDRODIURIL) 12.5 MG tablet Take 1 tablet (12.5 mg total) by mouth daily.   KRILL OIL OMEGA-3 PO Take by mouth. Krill oil 1600 mg   Lactobacillus (PROBIOTIC ACIDOPHILUS PO) Take by mouth.   OVER THE COUNTER MEDICATION Garcinia Cambogia Extract 1600 mg  Calcium 222 mg        10/17/2022   10:10 AM 07/09/2022    3:00 PM 04/18/2022    9:50 AM 11/08/2021    9:03 AM  GAD 7 : Generalized Anxiety Score  Nervous, Anxious, on Edge 0 0 0 0  Control/stop worrying 0 0 0 0  Worry too much - different things 0 0 0 0  Trouble relaxing 0 0 0 0  Restless 0 0 0 0  Easily annoyed or irritable 0 0 0 0  Afraid - awful might happen 0 0 0 0  Total GAD 7 Score 0 0 0 0  Anxiety Difficulty Not difficult at all Not difficult at all Not difficult at all Not difficult at all       04/21/2023    9:54 AM 10/17/2022   10:10 AM 09/19/2022   11:06 AM  Depression screen PHQ 2/9  Decreased Interest 0 0 0  Down, Depressed, Hopeless 0 0 0  PHQ - 2 Score 0 0 0  Altered sleeping  0 0  Tired, decreased energy  0 0  Change  in appetite  0 0  Feeling bad or failure about yourself   0 0  Trouble concentrating  0 0  Moving slowly or fidgety/restless  0 0  Suicidal thoughts  0 0  PHQ-9 Score  0 0  Difficult doing work/chores  Not difficult at all Not difficult at all    BP Readings from Last 3 Encounters:  04/21/23 116/78  12/18/22 118/82  11/29/22 105/73    Physical Exam Vitals and nursing note reviewed. Exam conducted with a chaperone present.  Constitutional:      General: She is not in acute distress.    Appearance: She is not diaphoretic.  HENT:     Head: Normocephalic and atraumatic.     Right Ear: Tympanic membrane, ear canal and external ear normal. There is no impacted cerumen.     Left Ear: Tympanic membrane, ear canal and external ear normal. There is no impacted cerumen.     Nose: Nose normal. No congestion or rhinorrhea.     Mouth/Throat:     Mouth: Mucous membranes are moist.     Pharynx: No oropharyngeal exudate.  Eyes:     General:        Right eye: No discharge.        Left eye: No discharge.     Conjunctiva/sclera: Conjunctivae normal.     Pupils: Pupils are equal, round, and reactive to light.  Neck:     Thyroid: No thyromegaly.     Vascular: No JVD.   Cardiovascular:     Rate and Rhythm: Normal rate and regular rhythm.     Heart sounds: Normal heart sounds. No murmur heard.    No friction rub. No gallop.  Pulmonary:     Effort: Pulmonary effort is normal.     Breath sounds: Normal breath sounds. No wheezing, rhonchi or rales.  Abdominal:     General: Bowel sounds are normal.     Palpations: Abdomen is soft. There is no mass.     Tenderness: There is no abdominal tenderness. There is no right CVA tenderness, left CVA tenderness or guarding.  Musculoskeletal:        General: Normal range of motion.     Cervical back: Normal range of motion and neck supple.  Lymphadenopathy:     Cervical: No cervical adenopathy.  Skin:    General: Skin is warm and dry.  Neurological:     Mental Status: She is alert.     Deep Tendon Reflexes: Reflexes are normal and symmetric.     Wt Readings from Last 3 Encounters:  04/21/23 142 lb (64.4 kg)  12/18/22 141 lb (64 kg)  11/29/22 140 lb 9.6 oz (63.8 kg)    BP 116/78   Pulse 76   Resp 16   Ht 5\' 8"  (1.727 m)   Wt 142 lb (64.4 kg)   SpO2 97%   BMI 21.59 kg/m   Assessment and Plan: 1. Essential hypertension Chronic.  Controlled.  Stable.  Blood pressure 116/78.  Asymptomatic.  Tolerating medication well.  Continue hydrochlorothiazide 12.5 mg once a day.  Will check renal function panel for electrolytes and GFR.  Will recheck patient in 6 months. - hydrochlorothiazide (HYDRODIURIL) 12.5 MG tablet; Take 1 tablet (12.5 mg total) by mouth daily.  Dispense: 90 tablet; Refill: 1 - Renal Function Panel  2. Mixed hyperlipidemia Chronic.  Controlled.  Stable.  Continue atorvastatin 10 mg once a day.  Will check lipid panel for current level of LDL.  Will  continue atorvastatin 10 mg once a day and check lipid panel for current level of control. - atorvastatin (LIPITOR) 10 MG tablet; Take 1 tablet (10 mg total) by mouth daily.  Dispense: 90 tablet; Refill: 1 - Lipid Panel With LDL/HDL Ratio  3.  Anxiety and depression Chronic.  Controlled.  Stable.  PHQ 2 was 0 GAD score.  GAD score is 0.  Continue fluoxetine 20 mg once a day..  Will recheck in 6 months. - FLUoxetine (PROZAC) 20 MG tablet; Take 1 tablet (20 mg total) by mouth daily.  Dispense: 90 tablet; Refill: 1  4. Localized osteoporosis without current pathological fracture Chronic.  Discussed that we will likely check at the 2-year mark a repeat DEXA scan.  Patient will be seeing her surgeon in patient is in the process of healing her current hip surgery.  Will continue with Fosamax 70 mg once a week and will recheck and 6 months. - alendronate (FOSAMAX) 70 MG tablet; Take 1 tablet (70 mg total) by mouth once a week. Take with a full glass of water on an empty stomach.  Dispense: 12 tablet; Refill: 3  5. Need for hepatitis C screening test Will one-time check hepatitis C screening test for antibody. - Hepatitis C antibody     Elizabeth Sauer, MD

## 2023-04-22 ENCOUNTER — Encounter: Payer: Self-pay | Admitting: Family Medicine

## 2023-04-22 LAB — HEPATITIS C ANTIBODY: Hep C Virus Ab: NONREACTIVE

## 2023-04-22 LAB — RENAL FUNCTION PANEL
Albumin: 4.6 g/dL (ref 3.8–4.8)
BUN/Creatinine Ratio: 38 — ABNORMAL HIGH (ref 12–28)
BUN: 25 mg/dL (ref 8–27)
CO2: 26 mmol/L (ref 20–29)
Calcium: 10.1 mg/dL (ref 8.7–10.3)
Chloride: 100 mmol/L (ref 96–106)
Creatinine, Ser: 0.65 mg/dL (ref 0.57–1.00)
Glucose: 113 mg/dL — ABNORMAL HIGH (ref 70–99)
Phosphorus: 4.3 mg/dL (ref 3.0–4.3)
Potassium: 4.3 mmol/L (ref 3.5–5.2)
Sodium: 142 mmol/L (ref 134–144)
eGFR: 92 mL/min/{1.73_m2} (ref >59)

## 2023-04-22 LAB — LIPID PANEL WITH LDL/HDL RATIO
Cholesterol, Total: 160 mg/dL (ref 100–199)
HDL: 70 mg/dL (ref >39)
LDL Chol Calc (NIH): 72 mg/dL (ref 0–99)
LDL/HDL Ratio: 1 ratio (ref 0.0–3.2)
Triglycerides: 102 mg/dL (ref 0–149)
VLDL Cholesterol Cal: 18 mg/dL (ref 5–40)

## 2023-08-29 NOTE — Progress Notes (Signed)
 Referring Physician:  Reinaldo Berber, MD [address] [place],  [place] [address]  Primary Physician:  Duanne Limerick, MD  History of Present Illness: Samari Bittinger is a 78 y.o. female has a history of anxiety, HTN, hyperlipidemia.    History of L1 kyphoplasty on 04/09/22 by IR. Right hip replacement on 08/2022.   Last seen by me for phone visit on 01/24/23 and she was doing better after PT.   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 No instability seen on her xrays.   She is here for follow up.   She has constant LBP with posterior leg pain to her foot that is worse with standing/walking x 1 year. She has numbness, tingling, and weakness. No left leg pain. She has relief with OTC excedrine.   She did PT until December, she continues to go to the gym 3-4 times per week.    Bowel/Bladder Dysfunction: none   She does not smoke.    Conservative measures:  Physical therapy: went [facility] for 4-6 months. Stopped in December.  Multimodal medical therapy including regular antiinflammatories: tylenol, norco  Injections:  11/06/2022: RFA to the bilateral L4-5 and L5-S1 facet joints - helped with low back pain 10/01/2022: Right L4-5 and right L5-S1 transforaminal ESI (50% relief) 08/12/2022: right hip replacement Dr. Audelia Acton 05/02/2022: Right hip joint injection under fluoroscopy (3 weeks of good relief and then return of pain) 01/25/2022: RFA to the bilateral L4-5 and L5-S1 facet joints (100% relief)  01/03/2022: MBB to the bilateral L4-5 and L5-S1 facet joints (8/10 to 0/10) 12/19/2021: MBB to the bilateral L4-5 and L5-S1 facet joints (8/10 to 0/10) 10/25/2021: Right L4-5 and right L5-S1 transforaminal ESI (50% relief) 06/07/2020: Right L4-5 and right L5-S1 transforaminal ESI (50% relief) 01/31/2021: Right L4-5 and right L5-S1  transforaminal ESI (95% relief)    Past Surgery: L1 kyphoplasty on 04/09/22   Burman Nieves Sieling has no symptoms of cervical myelopathy.   The symptoms are causing a significant impact on the patient's life.   Review of Systems:  A 10 point review of systems is negative, except for the pertinent positives and negatives detailed in the HPI.  Past Medical History: Past Medical History:  Diagnosis Date   Arthritis    DDD (degenerative disc disease), lumbar    HLD (hyperlipidemia)    Hypertension    New onset left bundle branch block (LBBB) 07/31/2022   a.) noted on preoperative ECG; (+) associated retrosternal chest pressured for ~ 10 days.    Past Surgical History: Past Surgical History:  Procedure Laterality Date   ABDOMINAL HYSTERECTOMY     bladder lift  1995   COLONOSCOPY     COLONOSCOPY WITH PROPOFOL N/A 11/29/2022   Procedure: COLONOSCOPY WITH PROPOFOL;  Surgeon: Regis Bill, MD;  Location: [facility] ENDOSCOPY;  Service: Endoscopy;  Laterality: N/A;   EYE SURGERY  2005   lasix   IR KYPHO LUMBAR INC FX REDUCE BONE BX UNI/BIL CANNULATION INC/IMAGING  04/09/2022   IR RADIOLOGIST EVAL & MGMT  04/02/2022   IR RADIOLOGIST EVAL & MGMT  04/19/2022   LASER ABLATION Right    TONSILLECTOMY AND ADENOIDECTOMY     as a child   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   Procedure: TOTAL HIP ARTHROPLASTY ANTERIOR APPROACH;  Surgeon: Reinaldo Berber, MD;  Location: [facility] ORS;  Service: Orthopedics;  Laterality: Right;   TUBAL LIGATION      Allergies: Allergies as of 09/02/2023   (No Known Allergies)    Medications: Outpatient Encounter Medications as of 09/02/2023  Medication Sig   acetaminophen (TYLENOL) 500 MG tablet Take 2 tablets (1,000 mg total) by mouth every 8 (eight) hours. (Patient not taking: Reported on 04/21/2023)   alendronate (FOSAMAX) 70 MG tablet Take 1 tablet (70 mg total) by mouth once a week. Take with a full glass of water on an empty stomach.   Ascorbic  Acid (VITAMIN C) 1000 MG tablet Take 1,000 mg by mouth daily.   Aspirin-Acetaminophen-Caffeine (EXCEDRIN PO) Take by mouth.   atorvastatin (LIPITOR) 10 MG tablet Take 1 tablet (10 mg total) by mouth daily.   [employer] FIBER GUMMIES PO Take by mouth.   diphenhydramine-acetaminophen (TYLENOL PM) 25-500 MG TABS tablet Take 1 tablet by mouth at bedtime as needed.   FLUoxetine (PROZAC) 20 MG tablet Take 1 tablet (20 mg total) by mouth daily.   hydrochlorothiazide (HYDRODIURIL) 12.5 MG tablet Take 1 tablet (12.5 mg total) by mouth daily.   KRILL OIL OMEGA-3 PO Take by mouth. Krill oil 1600 mg   Lactobacillus (PROBIOTIC ACIDOPHILUS PO) Take by mouth.   METAMUCIL FIBER PO Take by mouth. (Patient not taking: Reported on 04/21/2023)   OVER THE COUNTER MEDICATION Garcinia Cambogia Extract 1600 mg  Calcium 222 mg   valACYclovir (VALTREX) 1000 MG tablet Take 1,000 mg by mouth daily. Dr Charlsie Merles Derm (Patient not taking: Reported on 04/21/2023)   No facility-administered encounter medications on file as of 09/02/2023.    Social History: Social History   Tobacco Use   Smoking status: Former   Smokeless tobacco: Never   Tobacco comments:    Uses vape w/ nicotine  Vaping Use   Vaping status: Every Day  Substance Use Topics   Alcohol use: Yes    Alcohol/week: 1.0 standard drink of alcohol    Types: 1 Glasses of wine per week    Comment: ocassionally   Drug use: Never    Family Medical History: Family History  Problem Relation Age of Onset   Hypertension Mother    Heart disease Father    Heart attack Maternal Grandmother    Stroke Maternal Grandmother     Physical Examination: There were no vitals filed for this visit.   General: Patient is well developed, well nourished, calm, collected, and in no apparent distress. Attention to examination is appropriate.  Respiratory: Patient is breathing without any difficulty.   NEUROLOGICAL:     Awake, alert, oriented to person, place, and time.   Speech is clear and fluent. Fund of knowledge is appropriate.   Cranial Nerves: Pupils equal round and reactive to light.  Facial tone is symmetric.    No posterior lumbar tenderness.   No abnormal lesions on exposed skin.   Strength: Side Biceps Triceps Deltoid Interossei Grip Wrist Ext. Wrist Flex.  R 5 5 5 5 5 5 5   L 5 5 5 5 5 5 5    Side Iliopsoas Quads Hamstring PF DF EHL  R 5 5 5 5 5 5   L 5 5 5 5 5 5    Reflexes are 2+ and symmetric at the biceps, brachioradialis, patella and achilles.   Hoffman's is absent.  Clonus is not present.   Bilateral upper and lower extremity sensation is intact to light touch.     No pain with IR/ER of both hips.   Gait is normal.  Medical Decision Making  Imaging: MRI of lumbar spine dated 03/26/22:  FINDINGS: Segmentation: There is transitional anatomy. In keeping with prior numbering convention the compression deformity is localized to the L1 vertebral body. There is lumbarization of S1 with the last well-formed disc space at S1-S2.   Alignment:  Trace anterolisthesis of L4 on L5.   Vertebrae: Redemonstrated is a compression deformity at the L1 vertebral body level which demonstrates T2/stir hyperintense signal abnormality, which is suggestive of vertebral body edema, which can be seen in the setting of an acute process. There is mild posterior bulging of the inferior endplate without significant spinal canal stenosis. There is no evidence of an epidural hematoma.   Conus medullaris and cauda equina: Conus extends to the L1 level. Conus and cauda equina appear normal.   Paraspinal and other soft tissues: There is a T2 hyperintense lesion of the inferior pole of the right kidney, most likely a small renal cyst which does not require further workup.   Disc levels:   T11-T12: Only imaged in the sagittal plane. No evidence of spinal canal or neural foraminal stenosis.   T12-L1: No disc bulge. No significant facet degenerative  change. No spinal canal stenosis. No neural foraminal stenosis.   L1-L2: Mild bilateral facet degenerative change. No spinal canal stenosis. No neural foraminal stenosis.   L2-L3: Mild bilateral facet degenerative change. Mild ligamentum flavum hypertrophy. No spinal canal stenosis. No neural foraminal stenosis.   L3-L4: Mild bilateral facet degenerative change. Minimal disc bulge. No spinal canal stenosis. No neural foraminal stenosis.   L4-L5: Moderate bilateral facet degenerative change. Ligamentum flavum hypertrophy. Right paracentral disc protrusion. There is mild narrowing of the lateral right lateral recess. No overall spinal canal stenosis. Is mild right neural foraminal stenosis.   L5-S1: Severe bilateral facet degenerative change. Grade 1 anterolisthesis. Minimal disc bulge. No spinal canal stenosis. Mild left neural foraminal stenosis.   S1-S2: Mild bilateral facet degenerative change. No spinal canal stenosis. No neural foraminal stenosis.   IMPRESSION: 1. In keeping with prior numbering convention, the compression deformity is localized to the L1 vertebral body. There is vertebral body edema associated with the L1 vertebral body, which can be seen in the setting of an acute on chronic process. There is mild posterior bulging of the inferior endplate without significant spinal canal stenosis. No evidence of an epidural hematoma. 2. Multilevel degenerative changes as above without high-grade spinal canal or neural foraminal stenosis.     Electronically Signed   By: Lorenza Cambridge M.D.   On: 03/27/2022 11:09   Lumbar xrays dated 03/20/22:  FINDINGS: L1 compression fracture with significant anterior wedging. This is estimated at 60% when compared to the normal vertebral body on 11/03/2019. This has increased slightly since the recent films from 02/14/2022 where it was approximately 50%. Stable slight retropulsion of the posteroinferior aspect of the vertebral  body.   No new/acute findings. 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   IMPRESSION: 1. 60% L1 compression fracture, slightly increased compared to the recent films from 02/14/2022 where it was approximately 50%. 2. No new/acute findings.     Electronically Signed   By: Rudie Meyer M.D.   On: 03/22/2022 08:48  I have personally reviewed the images and agree with the above interpretation.  Assessment and Plan: Ms. Debbie Montgomery is a pleasant 78 y.o. female with a history of L1 kyphoplasty on 04/09/22 by IR and right hip replacement 08/2022.   She  has constant LBP with posterior leg pain to her foot that is worse with standing/walking x 1 year. She has numbness, tingling, and weakness. No left leg pain.    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 No instability seen on her xrays.    Treatment options discussed with patient and following plan made:   - Will get updated lumbar MRI and lumbar xrays with flex/ext.  - Updated DEXA- she has known osteoporosis and has been on fosamax. Last DEXA was 05/23/22.  - Will likely have her follow up with Dr. Myer Haff once I have her results back. We will regroup once I review them.  - Of note, she lost her husband unexpectedly last fall.   I spent a total of 25 minutes in face-to-face and non-face-to-face activities related to this patient's care today including review of outside records, review of imaging, review of symptoms, physical exam, discussion of differential diagnosis, discussion of treatment options, and documentation.   Drake Leach PA-C Neurosurgery

## 2023-09-02 ENCOUNTER — Encounter: Payer: Self-pay | Admitting: Orthopedic Surgery

## 2023-09-02 ENCOUNTER — Ambulatory Visit (INDEPENDENT_AMBULATORY_CARE_PROVIDER_SITE_OTHER): Admitting: Orthopedic Surgery

## 2023-09-02 ENCOUNTER — Telehealth: Payer: Self-pay

## 2023-09-02 VITALS — BP 134/84 | Ht 68.0 in | Wt 153.0 lb

## 2023-09-02 DIAGNOSIS — M51362 Other intervertebral disc degeneration, lumbar region with discogenic back pain and lower extremity pain: Secondary | ICD-10-CM

## 2023-09-02 DIAGNOSIS — M810 Age-related osteoporosis without current pathological fracture: Secondary | ICD-10-CM

## 2023-09-02 DIAGNOSIS — M47816 Spondylosis without myelopathy or radiculopathy, lumbar region: Secondary | ICD-10-CM

## 2023-09-02 DIAGNOSIS — M4726 Other spondylosis with radiculopathy, lumbar region: Secondary | ICD-10-CM | POA: Diagnosis not present

## 2023-09-02 DIAGNOSIS — M4316 Spondylolisthesis, lumbar region: Secondary | ICD-10-CM

## 2023-09-02 DIAGNOSIS — M48061 Spinal stenosis, lumbar region without neurogenic claudication: Secondary | ICD-10-CM

## 2023-09-02 DIAGNOSIS — Z8781 Personal history of (healed) traumatic fracture: Secondary | ICD-10-CM

## 2023-09-02 DIAGNOSIS — M5416 Radiculopathy, lumbar region: Secondary | ICD-10-CM

## 2023-09-02 NOTE — Telephone Encounter (Signed)
 Please let the patient know that her DEXA order has been sent to [facility]. If she hasn't heard from anyone in 1 week, she needs to let us know. Thank you!

## 2023-09-02 NOTE — Patient Instructions (Signed)
 It was so nice to see you today. Thank you so much for coming in.    I want to get an MRI of your lower back to look into things further. We will get this approved through your insurance and [facility] will call you to schedule the appointment. When they call, ask about your patient responsibility. This is an estimate and does not need to be pain in full prior to having MRI. You can tell them to bill you. Remind them to do your xrays as well.   [facility] (building with the white pillars) is located off of [address]. The address is [address] [place], [place] [address].    I ordered a DEXA scan that will be done at [facility]. They will call you.   After you have the MRI and xrays, it takes 14-21 days for me to get the results back. Call me if you don't hear anything after a week.   We will touch base after I have you results and I likely will get you into see [facility].   Please do not hesitate to call if you have any questions or concerns. You can also message me in MyChart.   I am so sorry for your loss. Sending you the biggest hugs!  Drake Leach PA-C [phone]     The physicians and staff at [facility] at [facility] are committed to providing excellent care. You may receive a survey asking for feedback about your experience at our office. We value you your feedback and appreciate you taking the time to to fill it out. The [facility] leadership team is also available to discuss your experience in person, feel free to contact us [phone].

## 2023-09-10 ENCOUNTER — Ambulatory Visit
Admission: RE | Admit: 2023-09-10 | Discharge: 2023-09-10 | Disposition: A | Discharge status: Home / Self Care | Source: Ambulatory Visit | Attending: Orthopedic Surgery | Admitting: Orthopedic Surgery

## 2023-09-10 DIAGNOSIS — M4316 Spondylolisthesis, lumbar region: Secondary | ICD-10-CM | POA: Diagnosis present

## 2023-09-10 DIAGNOSIS — M51362 Other intervertebral disc degeneration, lumbar region with discogenic back pain and lower extremity pain: Secondary | ICD-10-CM | POA: Diagnosis present

## 2023-09-10 DIAGNOSIS — Z8781 Personal history of (healed) traumatic fracture: Secondary | ICD-10-CM

## 2023-09-10 DIAGNOSIS — M5416 Radiculopathy, lumbar region: Secondary | ICD-10-CM | POA: Diagnosis present

## 2023-09-10 DIAGNOSIS — M47816 Spondylosis without myelopathy or radiculopathy, lumbar region: Secondary | ICD-10-CM | POA: Diagnosis present

## 2023-09-12 NOTE — Progress Notes (Signed)
 Referring Physician:  Clarise Crooks, MD [address] [place],  [place] [address]  Primary Physician:  Clarise Crooks, MD  History of Present Illness: Debbie Montgomery is a 78 y.o. female has a history of anxiety, HTN, hyperlipidemia.    History of L1 kyphoplasty on 04/09/22 by IR. Right hip replacement on 08/2022.   Last seen by me on 09/02/23 for LBP and leg pain. 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 No instability seen on her xrays.   She is here to review her lumbar MRI/xrays and DEXA scan.   No change in her symptoms. She has constant LBP with right posterior leg pain to her foot that is worse with standing/walking. She has numbness, tingling, and weakness. LBP = right leg pain. No left leg pain. She has relief with OTC excedrine.   She did PT until December, she continues to go to the gym 3-4 times per week.   Has been on fosamax for about 18 months.    Bowel/Bladder Dysfunction: none   She does not smoke.    Conservative measures:  Physical therapy: went to [facility] for 4-6 months. Stopped in December.  Multimodal medical therapy including regular antiinflammatories: tylenol, norco  Injections:  11/06/2022: RFA to the bilateral L4-5 and L5-S1 facet joints - helped with low back pain 10/01/2022: Right L4-5 and right L5-S1 transforaminal ESI (50% relief) 08/12/2022: right hip replacement Dr. Clyda Dark 05/02/2022: Right hip joint injection under fluoroscopy (3 weeks of good relief and then return of pain) 01/25/2022: RFA to the bilateral L4-5 and L5-S1 facet joints (100% relief)  01/03/2022: MBB to the bilateral L4-5 and L5-S1 facet joints (8/10 to 0/10) 12/19/2021: MBB to the bilateral L4-5 and L5-S1 facet joints (8/10 to 0/10) 10/25/2021: Right L4-5 and right L5-S1 transforaminal ESI (50% relief) 06/07/2020: Right L4-5  and right L5-S1 transforaminal ESI (50% relief) 01/31/2021: Right L4-5 and right L5-S1 transforaminal ESI (95% relief)    Past Surgery: L1 kyphoplasty on 04/09/22   Mae Schlossman Mataya has no symptoms of cervical myelopathy.   The symptoms are causing a significant impact on the patient's life.   Review of Systems:  A 10 point review of systems is negative, except for the pertinent positives and negatives detailed in the HPI.  Past Medical History: Past Medical History:  Diagnosis Date   Arthritis    DDD (degenerative disc disease), lumbar    HLD (hyperlipidemia)    Hypertension    New onset left bundle branch block (LBBB) 07/31/2022   a.) noted on preoperative ECG; (+) associated retrosternal chest pressured for ~ 10 days.    Past Surgical History: Past Surgical History:  Procedure Laterality Date   ABDOMINAL HYSTERECTOMY     bladder lift  1995   COLONOSCOPY     COLONOSCOPY WITH PROPOFOL N/A 11/29/2022   Procedure: COLONOSCOPY WITH PROPOFOL;  Surgeon: Shane Darling, MD;  Location: [facility] ENDOSCOPY;  Service: Endoscopy;  Laterality: N/A;   EYE SURGERY  2005   lasix   IR KYPHO LUMBAR INC FX REDUCE BONE BX UNI/BIL CANNULATION INC/IMAGING  04/09/2022   IR RADIOLOGIST EVAL & MGMT  04/02/2022   IR RADIOLOGIST EVAL & MGMT  04/19/2022   LASER ABLATION Right    TONSILLECTOMY AND ADENOIDECTOMY     as a child   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   Procedure:  TOTAL HIP ARTHROPLASTY ANTERIOR APPROACH;  Surgeon: Venus Ginsberg, MD;  Location: [facility] ORS;  Service: Orthopedics;  Laterality: Right;   TUBAL LIGATION      Allergies: Allergies as of 09/15/2023   (No Known Allergies)    Medications: Outpatient Encounter Medications as of 09/15/2023  Medication Sig   acetaminophen (TYLENOL) 500 MG tablet Take 2 tablets (1,000 mg total) by mouth every 8 (eight) hours.   alendronate (FOSAMAX) 70 MG tablet Take 1 tablet (70 mg total) by mouth once a week. Take with a full glass  of water on an empty stomach.   Aspirin-Acetaminophen-Caffeine (EXCEDRIN PO) Take by mouth.   atorvastatin (LIPITOR) 10 MG tablet Take 1 tablet (10 mg total) by mouth daily.   [employer] FIBER GUMMIES PO Take by mouth.   diphenhydramine-acetaminophen (TYLENOL PM) 25-500 MG TABS tablet Take 1 tablet by mouth at bedtime as needed.   FLUoxetine (PROZAC) 20 MG tablet Take 1 tablet (20 mg total) by mouth daily.   hydrochlorothiazide (HYDRODIURIL) 12.5 MG tablet Take 1 tablet (12.5 mg total) by mouth daily.   METAMUCIL FIBER PO Take by mouth.   OVER THE COUNTER MEDICATION Garcinia Cambogia Extract 1600 mg  Calcium 222 mg   [DISCONTINUED] Ascorbic Acid (VITAMIN C) 1000 MG tablet Take 1,000 mg by mouth daily.   [DISCONTINUED] KRILL OIL OMEGA-3 PO Take by mouth. Krill oil 1600 mg   No facility-administered encounter medications on file as of 09/15/2023.    Social History: Social History   Tobacco Use   Smoking status: Former   Smokeless tobacco: Never   Tobacco comments:    Uses vape w/ nicotine  Vaping Use   Vaping status: Every Day  Substance Use Topics   Alcohol use: Yes    Alcohol/week: 1.0 standard drink of alcohol    Types: 1 Glasses of wine per week    Comment: ocassionally   Drug use: Never    Family Medical History: Family History  Problem Relation Age of Onset   Hypertension Mother    Heart disease Father    Heart attack Maternal Grandmother    Stroke Maternal Grandmother     Physical Examination: Vitals:   09/15/23 1043  BP: 120/78      Awake, alert, oriented to person, place, and time.  Speech is clear and fluent. Fund of knowledge is appropriate.   Cranial Nerves: Pupils equal round and reactive to light.  Facial tone is symmetric.    No abnormal lesions on exposed skin.   Strength:  Side Iliopsoas Quads Hamstring PF DF EHL  R 5 5 5 5 5 5   L 5 5 5 5 5 5    Reflexes are 2+ and symmetric at the patella and achilles.    Clonus is not present.   Bilateral lower  extremity sensation is intact to light touch.     Gait is normal.    Medical Decision Making  Imaging: MRI of lumbar spine dated 09/10/23:  FINDINGS: Segmentation:  Transitional lumbosacral anatomy lumbarization of S1.   Alignment:  Minimal scoliosis as described on the plain films   Vertebrae:  Old fracture deformity of L1   Conus medullaris and cauda equina: Conus extends to the L1 level. Conus and cauda equina appear normal.   Paraspinal and other soft tissues: Normal   Disc levels:   T12 - L1 No disc protrusion. No foraminal stenosis. No central canal stenosis.   L1 - L2 old compression fracture deformity of L1 less than 25% loss of height  of the inferior endplate. With very small retropulsed fragment in the canal along the posteroinferior corner unchanged since prior examination. Prior vertebroplasty. No canal space-occupying lesions.   L2 - L3 mildly desiccated disc material without disc herniations or foraminal narrowing   L3 - L4 desiccated disc material with a small right paracentral right lateral spondylitic disc herniation partially encroaching the right neural foramina   L4 - L5 severe degenerative disc disease with desiccated disc material narrowing of the disc space. With central, left paracentral spondylitic disc herniation flattening the thecal sac and encroaching on both neural foramina, left more than right.   L5 - S1 narrowing of the disc space with a central bulging disc flattening the thecal sac without foraminal narrowing.   IMPRESSION: *Old compression fracture deformity of L1 with prior vertebroplasty. *Multilevel degenerative disc disease with spondylitic disc herniations at L3-4, L4-5 and L5-S1 as described.     Electronically Signed   By: Fredrich Jefferson M.D.   On: 09/12/2023 13:00   Lumbar xrays dated 09/10/23:  FINDINGS: Mild levoscoliosis. Multilevel degenerative disc disease without compression fracture deformity of L1 and prior  vertebroplasty   Persistent narrowing of the L4-5 disc space with transitional S1 vertebra. Degenerative facet disease L4-5 and L5-S1. No spondylolysis or significant spondylolisthesis.   Flexion-extension images demonstrates no ligamentous instability   IMPRESSION: *Multilevel degenerative disc disease and facet disease. *No acute fracture or subluxation. *No ligamentous instability.     Electronically Signed   By: Fredrich Jefferson M.D.   On: 09/12/2023 12:55    DEXA bone density dated 09/09/23:  INTERPRETATION: Osteoporosis. Right hip and lumbar spine not reported due  to prior surgery.     FRACTURE RISK ASSESSMENT (FRAX)  __4.7___  10 year risk of hip fracture.      __20___  10 year risk of any  major fracture.   TREATMENT:   The National Osteoporosis Foundation (2014) Treatment Guidelines for:  Consider FDA-approved medical therapies in postmenopausal women and men  aged 32 years and older, based on the following:  A hip or vertebral (clinical or morphometric) fracture  T-score <= -2.5 at the femoral neck or spine after appropriate evaluation  to exclude secondary causes  Low bone mass (T-score between -1.0 and -2.5 at the femoral neck or spine)  and a 10-year probability of a hip fracture >= 3% or a 10-year probability  of a major osteoporosis-related fracture >= 20% based on the UA-adapted  WHO algorithm  Clinicians judgment and/or patient preferences may indicate treatment for  people with 10-year fracture probabilities above or below these levels  BMD should be monitored two years after initiating therapy and at two-year  intervals thereafter.   _______________________________________  A. Buel Carls, M.D.             I have personally reviewed the images and agree with the above interpretation.  Assessment and Plan: Ms. Strothman is a pleasant 78 y.o. female with a history of L1 kyphoplasty on 04/09/22 by IR and right hip replacement 08/2022.   She has  constant LBP with right posterior leg pain to her foot that is worse with standing/walking. She has numbness, tingling, and weakness. LBP = right leg pain. No left leg pain.   She has known transitional anatomy with right sided disc at L3-L4 with right foraminal stenosis, left sided disc L4-L5 with severe DDD, and slip L5-S1. No instability seen on her xrays.   DEXA shows osteoporosis- she has been on fosamax for 18 months.  Treatment options discussed with patient and following plan made:   - Will review imaging with Dr. Mont Antis regarding any surgical options. She has failed conservative treatment including PT, injections, and time.  - Message to her PCP regarding fosamax. May consider other treatments as she continues with osteoporosis.  - Will message her back with further plan and follow up.   Of note, she lost her husband unexpectedly last fall.   I spent a total of 20 minutes in face-to-face and non-face-to-face activities related to this patient's care today including review of outside records, review of imaging, review of symptoms, physical exam, discussion of differential diagnosis, discussion of treatment options, and documentation.   Lucetta Russel PA-C Neurosurgery

## 2023-09-15 ENCOUNTER — Ambulatory Visit (INDEPENDENT_AMBULATORY_CARE_PROVIDER_SITE_OTHER): Admitting: Orthopedic Surgery

## 2023-09-15 ENCOUNTER — Encounter: Payer: Self-pay | Admitting: Orthopedic Surgery

## 2023-09-15 VITALS — BP 120/78 | Ht 68.0 in | Wt 153.0 lb

## 2023-09-15 DIAGNOSIS — M4726 Other spondylosis with radiculopathy, lumbar region: Secondary | ICD-10-CM | POA: Diagnosis not present

## 2023-09-15 DIAGNOSIS — M4316 Spondylolisthesis, lumbar region: Secondary | ICD-10-CM

## 2023-09-15 DIAGNOSIS — M810 Age-related osteoporosis without current pathological fracture: Secondary | ICD-10-CM

## 2023-09-15 DIAGNOSIS — M47816 Spondylosis without myelopathy or radiculopathy, lumbar region: Secondary | ICD-10-CM

## 2023-09-15 DIAGNOSIS — M48061 Spinal stenosis, lumbar region without neurogenic claudication: Secondary | ICD-10-CM | POA: Diagnosis not present

## 2023-09-15 DIAGNOSIS — M51362 Other intervertebral disc degeneration, lumbar region with discogenic back pain and lower extremity pain: Secondary | ICD-10-CM

## 2023-09-15 DIAGNOSIS — M5416 Radiculopathy, lumbar region: Secondary | ICD-10-CM

## 2023-09-15 DIAGNOSIS — Z8781 Personal history of (healed) traumatic fracture: Secondary | ICD-10-CM

## 2023-09-15 NOTE — Patient Instructions (Signed)
 It was so nice to see you today. Thank you so much for coming in.    You have wear and tear in your back that is worse at the last two levels (L4-L5 and L5-S1).   Your bone density shows osteoporosis. I will send a message to your PCP about considering further treatment (besides the fosamax).   I will review your imaging with Dr. Mont Antis and message you with further plan. You should hear from me this week. Will plan follow up at that time as well.   Please do not hesitate to call if you have any questions or concerns. You can also message me in MyChart.   Lucetta Russel PA-C [phone]     The physicians and staff at [facility] at [facility] are committed to providing excellent care. You may receive a survey asking for feedback about your experience at our office. We value you your feedback and appreciate you taking the time to to fill it out. The [facility] leadership team is also available to discuss your experience in person, feel free to contact us  [phone].

## 2023-09-16 ENCOUNTER — Encounter: Payer: Self-pay | Admitting: Orthopedic Surgery

## 2023-09-16 DIAGNOSIS — M51362 Other intervertebral disc degeneration, lumbar region with discogenic back pain and lower extremity pain: Secondary | ICD-10-CM

## 2023-09-16 DIAGNOSIS — M4316 Spondylolisthesis, lumbar region: Secondary | ICD-10-CM

## 2023-09-16 DIAGNOSIS — Z8781 Personal history of (healed) traumatic fracture: Secondary | ICD-10-CM

## 2023-09-16 DIAGNOSIS — M47816 Spondylosis without myelopathy or radiculopathy, lumbar region: Secondary | ICD-10-CM

## 2023-09-16 DIAGNOSIS — M5416 Radiculopathy, lumbar region: Secondary | ICD-10-CM

## 2023-09-16 NOTE — Telephone Encounter (Signed)
 Patient reviewed with Dr. Mont Antis along with her imaging. He does not recommend lumbar surgery and thinks she may be a candidate for a SCS.   Message sent to patient. Recommend she see Dr. Rhesa Celeste to discuss further.

## 2023-09-17 NOTE — Addendum Note (Signed)
 Addended byLucetta Russel on: 09/17/2023 08:51 AM   Modules accepted: Orders

## 2023-09-17 NOTE — Telephone Encounter (Signed)
 Spoke with patient regarding possible SCS. She called her daughter Debbie Montgomery and I spoke with her as well.   Referral put in for her to see Dr. Rhesa Celeste to consider possible SCS.   I did send message to her PCP regarding osteoporosis treatment. If there are any issues, will refer her to fragility clinic in [place]. She will let me know.

## 2023-09-18 ENCOUNTER — Encounter: Payer: Self-pay | Admitting: Family Medicine

## 2023-09-18 ENCOUNTER — Ambulatory Visit (INDEPENDENT_AMBULATORY_CARE_PROVIDER_SITE_OTHER): Admitting: Family Medicine

## 2023-09-18 VITALS — BP 117/68 | HR 71 | Resp 16 | Ht 68.0 in | Wt 154.2 lb

## 2023-09-18 DIAGNOSIS — M816 Localized osteoporosis [Lequesne]: Secondary | ICD-10-CM

## 2023-09-18 DIAGNOSIS — F419 Anxiety disorder, unspecified: Secondary | ICD-10-CM | POA: Diagnosis not present

## 2023-09-18 DIAGNOSIS — E782 Mixed hyperlipidemia: Secondary | ICD-10-CM

## 2023-09-18 DIAGNOSIS — M8000XD Age-related osteoporosis with current pathological fracture, unspecified site, subsequent encounter for fracture with routine healing: Secondary | ICD-10-CM

## 2023-09-18 DIAGNOSIS — F32A Depression, unspecified: Secondary | ICD-10-CM

## 2023-09-18 DIAGNOSIS — S32010D Wedge compression fracture of first lumbar vertebra, subsequent encounter for fracture with routine healing: Secondary | ICD-10-CM

## 2023-09-18 DIAGNOSIS — I10 Essential (primary) hypertension: Secondary | ICD-10-CM

## 2023-09-18 MED ORDER — ALENDRONATE SODIUM 70 MG PO TABS
70.0000 mg | ORAL_TABLET | ORAL | 3 refills | Status: DC
Start: 2023-09-18 — End: 2023-11-06

## 2023-09-18 MED ORDER — ATORVASTATIN CALCIUM 10 MG PO TABS: 10.0000 mg | ORAL_TABLET | Freq: Every day | ORAL | 1 refills | Status: DC

## 2023-09-18 MED ORDER — HYDROCHLOROTHIAZIDE 12.5 MG PO TABS
12.5000 mg | ORAL_TABLET | Freq: Every day | ORAL | 1 refills | Status: DC
Start: 2023-09-18 — End: 2024-04-13

## 2023-09-18 MED ORDER — FLUOXETINE HCL 20 MG PO TABS: 20.0000 mg | ORAL_TABLET | Freq: Every day | ORAL | 1 refills | Status: DC

## 2023-09-18 NOTE — Progress Notes (Signed)
 Date:  09/18/2023   Name:  Debbie Montgomery [facility]   DOB:  09-21-1945   MRN:  [id number]   Chief Complaint: Osteoporosis (/), Hypertension, Anxiety, Depression, and Hyperlipidemia  Hypertension This is a chronic problem. The current episode started more than 1 year ago. The problem has been gradually improving since onset. The problem is controlled. Associated symptoms include anxiety. Pertinent negatives include no blurred vision, chest pain, headaches, malaise/fatigue, neck pain, orthopnea, palpitations, peripheral edema, PND, shortness of breath or sweats. There are no associated agents to hypertension. Risk factors for coronary artery disease include dyslipidemia. Past treatments include diuretics. The current treatment provides moderate improvement. There are no compliance problems.  There is no history of CAD/MI or CVA. There is no history of chronic renal disease, a hypertension causing med or renovascular disease.  Anxiety Presents for follow-up visit. Patient reports no chest pain, compulsions, confusion, decreased concentration, depressed mood, dizziness, dry mouth, excessive worry, feeling of choking, hyperventilation, impotence, insomnia, irritability, malaise, muscle tension, nausea, nervous/anxious behavior, obsessions, palpitations, panic, restlessness, shortness of breath or suicidal ideas. Symptoms occur rarely.    Depression        Associated symptoms include no decreased concentration, does not have insomnia, no restlessness, no headaches and no suicidal ideas.  Past medical history includes anxiety.   Hyperlipidemia She has no history of chronic renal disease. Pertinent negatives include no chest pain or shortness of breath.    Lab Results  Component Value Date   NA 142 04/21/2023   K 4.3 04/21/2023   CO2 26 04/21/2023   GLUCOSE 113 (H) 04/21/2023   BUN 25 04/21/2023   CREATININE 0.65 04/21/2023   CALCIUM 10.1 04/21/2023   EGFR 92 04/21/2023   GFRNONAA >60  08/13/2022   Lab Results  Component Value Date   CHOL 160 04/21/2023   HDL 70 04/21/2023   LDLCALC 72 04/21/2023   TRIG 102 04/21/2023   Lab Results  Component Value Date   TSH 1.25 06/25/2019   No results found for: "HGBA1C" Lab Results  Component Value Date   WBC 10.0 08/13/2022   HGB 10.0 (L) 08/13/2022   HCT 29.7 (L) 08/13/2022   MCV 95.2 08/13/2022   PLT 191 08/13/2022   Lab Results  Component Value Date   ALT 12 07/31/2022   AST 17 07/31/2022   ALKPHOS 28 (L) 07/31/2022   BILITOT 0.5 07/31/2022   No results found for: "25OHVITD2", "25OHVITD3", "VD25OH"   Review of Systems  Constitutional:  Negative for irritability and malaise/fatigue.  Eyes:  Negative for blurred vision.  Respiratory:  Negative for shortness of breath.   Cardiovascular:  Negative for chest pain, palpitations, orthopnea and PND.  Gastrointestinal:  Negative for nausea.  Genitourinary:  Negative for impotence.  Musculoskeletal:  Negative for neck pain.  Neurological:  Negative for dizziness and headaches.  Psychiatric/Behavioral:  Positive for depression. Negative for confusion, decreased concentration and suicidal ideas. The patient is not nervous/anxious and does not have insomnia.     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   Aphthous ulcer of mouth 08/11/2019   High blood pressure 07/01/2008    No Known Allergies  Past Surgical History:  Procedure Laterality Date   ABDOMINAL  HYSTERECTOMY     bladder lift  1995   COLONOSCOPY     COLONOSCOPY WITH PROPOFOL N/A 11/29/2022   Procedure: COLONOSCOPY WITH PROPOFOL;  Surgeon: Shane Darling, MD;  Location: [facility] ENDOSCOPY;  Service: Endoscopy;  Laterality: N/A;    EYE SURGERY  2005   lasix   IR KYPHO LUMBAR INC FX REDUCE BONE BX UNI/BIL CANNULATION INC/IMAGING  04/09/2022   IR RADIOLOGIST EVAL & MGMT  04/02/2022   IR RADIOLOGIST EVAL & MGMT  04/19/2022   LASER ABLATION Right    TONSILLECTOMY AND ADENOIDECTOMY     as a child   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   Procedure: TOTAL HIP ARTHROPLASTY ANTERIOR APPROACH;  Surgeon: Venus Ginsberg, MD;  Location: [facility] ORS;  Service: Orthopedics;  Laterality: Right;   TUBAL LIGATION      Social History   Tobacco Use   Smoking status: Former   Smokeless tobacco: Never   Tobacco comments:    Uses vape w/ nicotine  Vaping Use   Vaping status: Every Day  Substance Use Topics   Alcohol use: Yes    Alcohol/week: 1.0 standard drink of alcohol    Types: 1 Glasses of wine per week    Comment: ocassionally   Drug use: Never     Medication list has been reviewed and updated.  Current Meds  Medication Sig   acetaminophen (TYLENOL) 500 MG tablet Take 2 tablets (1,000 mg total) by mouth every 8 (eight) hours.   Aspirin-Acetaminophen-Caffeine (EXCEDRIN PO) Take by mouth.   [employer] FIBER GUMMIES PO Take by mouth.   diphenhydramine-acetaminophen (TYLENOL PM) 25-500 MG TABS tablet Take 1 tablet by mouth at bedtime as needed.   METAMUCIL FIBER PO Take by mouth.   OVER THE COUNTER MEDICATION Garcinia Cambogia Extract 1600 mg  Calcium 222 mg   [DISCONTINUED] alendronate (FOSAMAX) 70 MG tablet Take 1 tablet (70 mg total) by mouth once a week. Take with a full glass of water on an empty stomach.   [DISCONTINUED] atorvastatin (LIPITOR) 10 MG tablet Take 1 tablet (10 mg total) by mouth daily.   [DISCONTINUED] FLUoxetine (PROZAC) 20 MG tablet Take 1 tablet (20 mg total) by mouth daily.   [DISCONTINUED] hydrochlorothiazide (HYDRODIURIL) 12.5 MG tablet Take 1 tablet (12.5 mg total) by mouth daily.       09/18/2023   11:21 AM 10/17/2022   10:10 AM 07/09/2022    3:00 PM 04/18/2022    9:50 AM  GAD 7 : Generalized  Anxiety Score  Nervous, Anxious, on Edge 0 0 0 0  Control/stop worrying 0 0 0 0  Worry too much - different things 0 0 0 0  Trouble relaxing 0 0 0 0  Restless 0 0 0 0  Easily annoyed or irritable 0 0 0 0  Afraid - awful might happen 0 0 0 0  Total GAD 7 Score 0 0 0 0  Anxiety Difficulty  Not difficult at all Not difficult at all Not difficult at all       09/18/2023   11:21 AM 04/21/2023    9:54 AM 10/17/2022   10:10 AM  Depression screen PHQ 2/9  Decreased Interest 0 0 0  Down, Depressed, Hopeless 0 0 0  PHQ - 2 Score 0 0 0  Altered sleeping   0  Tired, decreased energy   0  Change in appetite   0  Feeling bad or failure about yourself    0  Trouble concentrating   0  Moving slowly  or fidgety/restless   0  Suicidal thoughts   0  PHQ-9 Score   0  Difficult doing work/chores   Not difficult at all    BP Readings from Last 3 Encounters:  09/18/23 117/68  09/15/23 120/78  09/02/23 134/84    Physical Exam Vitals and nursing note reviewed.  Constitutional:      General: She is not in acute distress.    Appearance: She is not diaphoretic.  HENT:     Head: Normocephalic and atraumatic.     Right Ear: Tympanic membrane, ear canal and external ear normal.     Left Ear: Tympanic membrane, ear canal and external ear normal.     Nose: Nose normal.  Eyes:     General:        Right eye: No discharge.        Left eye: No discharge.     Conjunctiva/sclera: Conjunctivae normal.     Pupils: Pupils are equal, round, and reactive to light.  Neck:     Thyroid: No thyromegaly.     Vascular: No JVD.  Cardiovascular:     Rate and Rhythm: Normal rate and regular rhythm.     Heart sounds: Normal heart sounds. No murmur heard.    No friction rub. No gallop.  Pulmonary:     Effort: Pulmonary effort is normal.     Breath sounds: Normal breath sounds. No wheezing, rhonchi or rales.  Abdominal:     General: Bowel sounds are normal.     Palpations: Abdomen is soft. There is no mass.      Tenderness: There is no abdominal tenderness. There is no guarding.  Musculoskeletal:        General: Normal range of motion.     Cervical back: Normal range of motion and neck supple.  Lymphadenopathy:     Cervical: No cervical adenopathy.  Skin:    General: Skin is warm and dry.  Neurological:     Mental Status: She is alert.     Deep Tendon Reflexes: Reflexes are normal and symmetric.     Wt Readings from Last 3 Encounters:  09/18/23 154 lb 3.2 oz (69.9 kg)  09/15/23 153 lb (69.4 kg)  09/02/23 153 lb (69.4 kg)    BP 117/68   Pulse 71   Resp 16   Ht 5\' 8"  (1.727 m)   Wt 154 lb 3.2 oz (69.9 kg)   SpO2 96%   BMI 23.45 kg/m   Assessment and Plan: 1. Localized osteoporosis without current pathological fracture (Primary) Chronic.  Persistent.  Not controlled.  Patient has been on Fosamax since December 2023 along with vitamin D and calcium as well.  Patient has been tolerating this well however it was noted that she does have a old compression fracture of L1 and is being treated by physiatry and [facility] clinic for multilevel degenerative disc disease.  Patient did have vertebroplasty of L1.  Patient is followed by Dr. Raynelle Fanning and she has noticed status post history of 1 kyphoplasty on 04/09/2022.  It appears that there is only compression deformity looks like the old one vertebral body but there has been some lumbarization of S1 with the lowest well-formed disc space level of S1-S2.  Repeat bone density in a -3.3 of the wrist and -2.0 of the femur that is nonsurgical.  Because of the previous history and avoidance of any further compression fractures we will refer to endocrinology for evaluation of more aggressive treatment of her osteoporosis with Reclast  or Prolia. - alendronate (FOSAMAX) 70 MG tablet; Take 1 tablet (70 mg total) by mouth once a week. Take with a full glass of water on an empty stomach.  Dispense: 12 tablet; Refill: 3  2. Mixed hyperlipidemia Chronic.  Controlled.   Stable patient is tolerating statins well and we will continue atorvastatin 10 mg once a day. - atorvastatin (LIPITOR) 10 MG tablet; Take 1 tablet (10 mg total) by mouth daily.  Dispense: 90 tablet; Refill: 1  3. Anxiety and depression Controlled.  Stable.  PHQ 0 GAD score is recheck patient - FLUoxetine (PROZAC) 20 MG tablet; Take 1 tablet (20 mg total) by mouth daily.  Dispense: 90 tablet; Refill: 1  4. Essential hypertension Chronic.  Controlled.  Stable.  Blood pressure today once patient is not quite at 6 months and we will wait - hydrochlorothiazide (HYDRODIURIL) 12.5 MG tablet; Take 1 tablet (12.5 mg total) by mouth daily.  Dispense: 90 tablet; Refill: 1  5. Age-related osteoporosis with current pathological fracture with routine healing, subsequent encounter As noted  6. Compression fracture of L1 vertebra with routine healing, subsequent encounter As noted above   I have placed a message to speak with Dr. Alvia Awkward considering relative unresponsiveness to the Fosamax and the need for more aggressive treatment of her osteoporosis and to inform her since she is also her gynecologist at the direction that we are taking. Alayne Allis, MD

## 2023-09-25 ENCOUNTER — Other Ambulatory Visit: Payer: Self-pay | Admitting: Student in an Organized Health Care Education/Training Program

## 2023-09-25 ENCOUNTER — Ambulatory Visit
Admission: RE | Admit: 2023-09-25 | Discharge: 2023-09-25 | Disposition: A | Discharge status: Home / Self Care | Source: Ambulatory Visit | Attending: Student in an Organized Health Care Education/Training Program | Admitting: Student in an Organized Health Care Education/Training Program

## 2023-09-25 ENCOUNTER — Ambulatory Visit (HOSPITAL_BASED_OUTPATIENT_CLINIC_OR_DEPARTMENT_OTHER): Admitting: Student in an Organized Health Care Education/Training Program

## 2023-09-25 ENCOUNTER — Encounter: Payer: Self-pay | Admitting: Student in an Organized Health Care Education/Training Program

## 2023-09-25 VITALS — BP 136/84 | HR 69 | Temp 97.5°F | Resp 16 | Ht 68.0 in | Wt 150.0 lb

## 2023-09-25 DIAGNOSIS — M51362 Other intervertebral disc degeneration, lumbar region with discogenic back pain and lower extremity pain: Secondary | ICD-10-CM | POA: Insufficient documentation

## 2023-09-25 DIAGNOSIS — M47816 Spondylosis without myelopathy or radiculopathy, lumbar region: Secondary | ICD-10-CM

## 2023-09-25 DIAGNOSIS — Z9889 Other specified postprocedural states: Secondary | ICD-10-CM | POA: Diagnosis present

## 2023-09-25 DIAGNOSIS — R52 Pain, unspecified: Secondary | ICD-10-CM

## 2023-09-25 NOTE — Patient Instructions (Addendum)
 Preparing for your procedure (without sedation) Instructions: Oral Intake: Do not eat or drink anything for at least 3 hours prior to your procedure. Transportation: Unless otherwise stated by your physician, you may drive yourself after the procedure. Blood Pressure Medicine: Take your blood pressure medicine with a sip of water  the morning of the procedure. Insulin: Take only  of your normal insulin dose. Preventing infections: Shower with an antibacterial soap the morning of your procedure. Build-up your immune system: Take 1000 mg of Vitamin C with every meal (3 times a day) the day prior to your procedure. Pregnancy: If you are pregnant, call and cancel the procedure. Sickness: If you have a cold, fever, or any active infections, call and cancel the procedure. Arrival: You must be in the facility at least 30 minutes prior to your scheduled procedure. Children: Do not bring any children with you. Dress appropriately: Bring dark clothing that you would not mind if they get stained. Valuables: Do not bring any jewelry or valuables. Procedure appointments are reserved for interventional treatments only. No Prescription Refills. No medication changes will be discussed during procedure appointments. No disability issues will be discussed. Complete [facility] psych eval Return for SPRINT PNS for the right L4 medial branch

## 2023-09-25 NOTE — Progress Notes (Signed)
 PROVIDER NOTE: Interpretation of information contained herein should be left to medically-trained personnel. Specific patient instructions are provided elsewhere under "Patient Instructions" section of medical record. This document was created in part using AI and STT-dictation technology, any transcriptional errors that may result from this process are unintentional.  Patient: Debbie Montgomery  Service: E/M Encounter  Provider: Cephus Collin, MD  DOB: 27-Aug-1945  Delivery: Face-to-face  Specialty: Interventional Pain Management  MRN: [id number]  Setting: Ambulatory outpatient facility  Specialty designation: 09  Type: New Patient  Location: Outpatient office facility  PCP: Clarise Crooks, MD  DOS: 09/25/2023    Referring Prov.: Lucetta Russel, PA-C   Primary Reason(s) for Visit: Encounter for initial evaluation of one or more chronic problems (new to examiner) potentially causing chronic pain, and posing a threat to normal musculoskeletal function. (Level of risk: High) CC: Back Pain and Leg Pain  HPI  Debbie Montgomery is a 78 y.o. year old, female patient, who comes for the first time to our practice referred by Lucetta Russel, PA-C for our initial evaluation of her chronic pain. She has High blood pressure; Varicose veins of leg with pain, bilateral; Family history of melanoma; Pelvic pressure in female; Neck pain; Headache disorder; Aphthous ulcer of mouth; Anxiety, generalized; Osteoarthritis of right hip; Degeneration of intervertebral disc of lumbar region with discogenic back pain and lower extremity pain; and History of kyphoplasty- L1 on their problem list. Today she comes in for evaluation of her Back Pain and Leg Pain  Pain Assessment: Location: Lower, Right, Left Back Radiating: Radiates from lower back to posterior right leg down to bottom of right feet. Onset: More than a month ago Duration: Chronic pain Quality: Aching, Constant, Cramping, Nagging, Sharp, Stabbing Severity: 6 /10  (subjective, self-reported pain score)  Effect on ADL: Limits ADLs Timing: Constant Modifying factors: Excerdrin BID daily and Tylenol  PM at night BP: 136/84  HR: 69  Onset and Duration: Present longer than 3 months Cause of pain: Work related accident or event Severity: Getting worse, NAS-11 at its worse: 10/10, NAS-11 at its best: 6/10, NAS-11 now: 7/10, and NAS-11 on the average: 8/10 Timing: Morning, Noon, Afternoon, Evening, Night, Not influenced by the time of the day, During activity or exercise, After activity or exercise, and After a period of immobility Aggravating Factors: Bending, Bowel movements, Kneeling, Lifiting, Prolonged sitting, Prolonged standing, Squatting, Stooping , Twisting, Walking, Walking uphill, and Walking downhill Alleviating Factors: Hot packs, Medications, and Warm showers or baths Associated Problems: Fatigue, Pain that wakes patient up, and Pain that does not allow patient to sleep Quality of Pain: Aching, Annoying, Constant, Cramping, Deep, Disabling, Exhausting, Nagging, Sharp, and Stabbing Previous Examinations or Tests: Bone scan, MRI scan, and X-rays Previous Treatments: The patient denies none listed  Ms. Funke is being evaluated for possible interventional pain management therapies for the treatment of her chronic pain.   Discussed the use of AI scribe software for clinical note transcription with the patient, who gave verbal consent to proceed.  History of Present Illness   Debbie Montgomery is a 78 year old female with a history of L1 fracture and degenerative disc disease who presents with chronic back and leg pain. She was referred by neurosurgery, Lucetta Russel for consideration of a spinal cord stimulator.  She experiences chronic pain originating in her back and radiating down her right leg to her foot, with the most severe pain on the right side. This pain has persisted since right hip arthroplasty which resulted  in a leg length  discrepancy, which she believes exacerbated her symptoms.  She has been on Fosamax  for a year and a half following an L1 fracture, but recent evaluations revealed that the medication has not been effective. She has undergone MRIs, x-rays, and a bone density scan as part of her workup. She has also been referred to an endocrinologist for further evaluation.  Her daily routine is significantly impacted by her pain. She wakes up with pain and typically takes Excedrin, waiting 20-30 minutes for relief before starting her day. Despite her pain, she attempts to maintain physical activity by going to the gym three times a week.  She underwent a kyphoplasty in October 2023. She has a history of degenerative disc disease, which complicates potential interventions for her pain management.  No blood thinners are being taken, and she has not yet completed a psychological evaluation, which is required before proceeding with certain pain management trials.   Conservative treatments: Physical therapy: went to [facility] last year for 4-6 months. Stopped in December.  Multimodal medical therapy including regular antiinflammatories: tylenol , norco  Injections:  11/06/2022: RFA to the bilateral L4-5 and L5-S1 facet joints - helped with low back pain 10/01/2022: Right L4-5 and right L5-S1 transforaminal ESI (50% relief) 08/12/2022: right hip replacement Dr. Clyda Dark 05/02/2022: Right hip joint injection under fluoroscopy (3 weeks of good relief and then return of pain) 01/25/2022: RFA to the bilateral L4-5 and L5-S1 facet joints (100% relief)  01/03/2022: MBB to the bilateral L4-5 and L5-S1 facet joints (8/10 to 0/10) 12/19/2021: MBB to the bilateral L4-5 and L5-S1 facet joints (8/10 to 0/10) 10/25/2021: Right L4-5 and right L5-S1 transforaminal ESI (50% relief) 06/07/2020: Right L4-5 and right L5-S1 transforaminal ESI (50% relief) 01/31/2021: Right L4-5 and right L5-S1 transforaminal ESI (95% relief)    Past  Surgery: L1 kyphoplasty on 04/09/22  She has been referred here to consider spinal cord stimulator.        Meds   Current Outpatient Medications:    acetaminophen  (TYLENOL ) 500 MG tablet, Take 2 tablets (1,000 mg total) by mouth every 8 (eight) hours., Disp: 30 tablet, Rfl: 0   alendronate  (FOSAMAX ) 70 MG tablet, Take 1 tablet (70 mg total) by mouth once a week. Take with a full glass of water  on an empty stomach., Disp: 12 tablet, Rfl: 3   Aspirin-Acetaminophen -Caffeine (EXCEDRIN PO), Take by mouth., Disp: , Rfl:    atorvastatin  (LIPITOR) 10 MG tablet, Take 1 tablet (10 mg total) by mouth daily., Disp: 90 tablet, Rfl: 1   [employer] FIBER GUMMIES PO, Take by mouth., Disp: , Rfl:    diphenhydramine-acetaminophen  (TYLENOL  PM) 25-500 MG TABS tablet, Take 1 tablet by mouth at bedtime as needed., Disp: , Rfl:    FLUoxetine  (PROZAC ) 20 MG tablet, Take 1 tablet (20 mg total) by mouth daily., Disp: 90 tablet, Rfl: 1   hydrochlorothiazide  (HYDRODIURIL ) 12.5 MG tablet, Take 1 tablet (12.5 mg total) by mouth daily., Disp: 90 tablet, Rfl: 1   METAMUCIL FIBER PO, Take by mouth., Disp: , Rfl:    OVER THE COUNTER MEDICATION, Garcinia Cambogia Extract 1600 mg  Calcium  222 mg, Disp: , Rfl:   Imaging Review   Lumbosacral Imaging: Lumbar MR wo contrast: Results for orders placed during the hospital encounter of 09/10/23  MR LUMBAR SPINE WO CONTRAST  Narrative CLINICAL DATA:  Low back pain history of prior fracture.  EXAM: MRI LUMBAR SPINE WITHOUT CONTRAST  TECHNIQUE: Multiplanar, multisequence MR imaging of the lumbar spine was performed.  No intravenous contrast was administered.  COMPARISON:  MRI lumbar December 26, 2022  FINDINGS: Segmentation:  Transitional lumbosacral anatomy lumbarization of S1.  Alignment:  Minimal scoliosis as described on the plain films  Vertebrae:  Old fracture deformity of L1  Conus medullaris and cauda equina: Conus extends to the L1 level. Conus and cauda equina  appear normal.  Paraspinal and other soft tissues: Normal  Disc levels:  T12 - L1 No disc protrusion. No foraminal stenosis. No central canal stenosis.  L1 - L2 old compression fracture deformity of L1 less than 25% loss of height of the inferior endplate. With very small retropulsed fragment in the canal along the posteroinferior corner unchanged since prior examination. Prior vertebroplasty. No canal space-occupying lesions.  L2 - L3 mildly desiccated disc material without disc herniations or foraminal narrowing  L3 - L4 desiccated disc material with a small right paracentral right lateral spondylitic disc herniation partially encroaching the right neural foramina  L4 - L5 severe degenerative disc disease with desiccated disc material narrowing of the disc space. With central, left paracentral spondylitic disc herniation flattening the thecal sac and encroaching on both neural foramina, left more than right.  L5 - S1 narrowing of the disc space with a central bulging disc flattening the thecal sac without foraminal narrowing.  IMPRESSION: *Old compression fracture deformity of L1 with prior vertebroplasty. *Multilevel degenerative disc disease with spondylitic disc herniations at L3-4, L4-5 and L5-S1 as described.   Electronically Signed By: Fredrich Jefferson M.D. On: 09/12/2023 13:00   DG Lumbar Spine 2-3 Views  Narrative CLINICAL DATA:  L1 compression fracture.  EXAM: LUMBAR SPINE - 2-3 VIEW  COMPARISON:  Multiple prior lumbar radiographs. The most recent films are 02/14/2022  FINDINGS: L1 compression fracture with significant anterior wedging. This is estimated at 60% when compared to the normal vertebral body on 11/03/2019. This has increased slightly since the recent films from 02/14/2022 where it was approximately 50%. Stable slight retropulsion of the posteroinferior aspect of the vertebral body.  No new/acute findings. Stable degenerative lumbar  spondylosis with disc disease and facet disease in the lower lumbar spine.  IMPRESSION: 1. 60% L1 compression fracture, slightly increased compared to the recent films from 02/14/2022 where it was approximately 50%. 2. No new/acute findings.   Electronically Signed By: Marrian Siva M.D. On: 03/22/2022 08:48   Narrative CLINICAL DATA:  Low back pain history of compression fracture  EXAM: LUMBAR SPINE - COMPLETE 4+ VIEW  COMPARISON:  MRI lumbar December 26, 2022, September 10, 2023, lumbar spine series December 26, 2022  FINDINGS: Mild levoscoliosis. Multilevel degenerative disc disease without compression fracture deformity of L1 and prior vertebroplasty  Persistent narrowing of the L4-5 disc space with transitional S1 vertebra. Degenerative facet disease L4-5 and L5-S1. No spondylolysis or significant spondylolisthesis.  Flexion-extension images demonstrates no ligamentous instability  IMPRESSION: *Multilevel degenerative disc disease and facet disease. *No acute fracture or subluxation. *No ligamentous instability.   Electronically Signed By: Fredrich Jefferson M.D. On: 09/12/2023 12:55      Complexity Note: Imaging results reviewed.                         ROS  Cardiovascular: High blood pressure and Needs antibiotics prior to dental procedures Pulmonary or Respiratory: Snoring  Neurological: No reported neurological signs or symptoms such as seizures, abnormal skin sensations, urinary and/or fecal incontinence, being born with an abnormal open spine and/or a tethered spinal cord Psychological-Psychiatric: Depressed Gastrointestinal:  No reported gastrointestinal signs or symptoms such as vomiting or evacuating blood, reflux, heartburn, alternating episodes of diarrhea and constipation, inflamed or scarred liver, or pancreas or irrregular and/or infrequent bowel movements Genitourinary: No reported renal or genitourinary signs or symptoms such as difficulty voiding or  producing urine, peeing blood, non-functioning kidney, kidney stones, difficulty emptying the bladder, difficulty controlling the flow of urine, or chronic kidney disease Hematological: Brusing easily Endocrine: No reported endocrine signs or symptoms such as high or low blood sugar, rapid heart rate due to high thyroid levels, obesity or weight gain due to slow thyroid or thyroid disease Rheumatologic: No reported rheumatological signs and symptoms such as fatigue, joint pain, tenderness, swelling, redness, heat, stiffness, decreased range of motion, with or without associated rash Musculoskeletal: Negative for myasthenia gravis, muscular dystrophy, multiple sclerosis or malignant hyperthermia Work History: Retired  Allergies  Ms. Dunphy has no known allergies.  Laboratory Chemistry Profile   Renal Lab Results  Component Value Date   BUN 25 04/21/2023   CREATININE 0.65 04/21/2023   BCR 38 (H) 04/21/2023   GFRAA 103 12/17/2019   GFRNONAA >60 08/13/2022   SPECGRAV 1.020 03/28/2020   PHUR 6.0 03/28/2020   PROTEINUR NEGATIVE 07/31/2022     Electrolytes Lab Results  Component Value Date   NA 142 04/21/2023   K 4.3 04/21/2023   CL 100 04/21/2023   CALCIUM  10.1 04/21/2023   PHOS 4.3 04/21/2023     Hepatic Lab Results  Component Value Date   AST 17 07/31/2022   ALT 12 07/31/2022   ALBUMIN 4.6 04/21/2023   ALKPHOS 28 (L) 07/31/2022     ID Lab Results  Component Value Date   STAPHAUREUS NEGATIVE 07/31/2022   MRSAPCR NEGATIVE 07/31/2022     Bone No results found for: "VD25OH", "VD125OH2TOT", "ZO1096EA5", "WU9811BJ4", "25OHVITD1", "25OHVITD2", "25OHVITD3", "TESTOFREE", "TESTOSTERONE"   Endocrine Lab Results  Component Value Date   GLUCOSE 113 (H) 04/21/2023   GLUCOSEU NEGATIVE 07/31/2022   TSH 1.25 06/25/2019     Neuropathy Lab Results  Component Value Date   VITAMINB12 415 06/25/2019     CNS No results found for: "COLORCSF", "APPEARCSF", "RBCCOUNTCSF",  "WBCCSF", "POLYSCSF", "LYMPHSCSF", "EOSCSF", "PROTEINCSF", "GLUCCSF", "JCVIRUS", "CSFOLI", "IGGCSF", "LABACHR", "ACETBL"   Inflammation (CRP: Acute  ESR: Chronic) No results found for: "CRP", "ESRSEDRATE", "LATICACIDVEN"   Rheumatology No results found for: "RF", "ANA", "LABURIC", "URICUR", "LYMEIGGIGMAB", "LYMEABIGMQN", "HLAB27"   Coagulation Lab Results  Component Value Date   PLT 191 08/13/2022     Cardiovascular Lab Results  Component Value Date   HGB 10.0 (L) 08/13/2022   HCT 29.7 (L) 08/13/2022     Screening Lab Results  Component Value Date   STAPHAUREUS NEGATIVE 07/31/2022   MRSAPCR NEGATIVE 07/31/2022     Cancer No results found for: "CEA", "CA125", "LABCA2"   Allergens No results found for: "ALMOND", "APPLE", "ASPARAGUS", "AVOCADO", "BANANA", "BARLEY", "BASIL", "BAYLEAF", "GREENBEAN", "LIMABEAN", "WHITEBEAN", "BEEFIGE", "REDBEET", "BLUEBERRY", "BROCCOLI", "CABBAGE", "MELON", "CARROT", "CASEIN", "CASHEWNUT", "CAULIFLOWER", "CELERY"     Note: Lab results reviewed.  PFSH  Drug: Ms. Peachey  reports no history of drug use. Alcohol:  reports current alcohol use of about 1.0 standard drink of alcohol per week. Tobacco:  reports that she has quit smoking. She has never used smokeless tobacco. Medical:  has a past medical history of Arthritis, DDD (degenerative disc disease), lumbar, HLD (hyperlipidemia), Hypertension, and New onset left bundle branch block (LBBB) (07/31/2022). Family: family history includes Heart attack in her maternal grandmother; Heart disease in her father;  Hypertension in her mother; Stroke in her maternal grandmother.  Past Surgical History:  Procedure Laterality Date   ABDOMINAL HYSTERECTOMY     bladder lift  1995   COLONOSCOPY     COLONOSCOPY WITH PROPOFOL  N/A 11/29/2022   Procedure: COLONOSCOPY WITH PROPOFOL ;  Surgeon: Shane Darling, MD;  Location: [facility];  Service: Endoscopy;  Laterality: N/A;   EYE SURGERY  2005   lasix    IR KYPHO LUMBAR INC FX REDUCE BONE BX UNI/BIL CANNULATION INC/IMAGING  04/09/2022   IR RADIOLOGIST EVAL & MGMT  04/02/2022   IR RADIOLOGIST EVAL & MGMT  04/19/2022   LASER ABLATION Right    TONSILLECTOMY AND ADENOIDECTOMY     as a child   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   Procedure: TOTAL HIP ARTHROPLASTY ANTERIOR APPROACH;  Surgeon: Venus Ginsberg, MD;  Location: [facility] ORS;  Service: Orthopedics;  Laterality: Right;   TUBAL LIGATION     Active Ambulatory Problems    Diagnosis Date Noted   High blood pressure 07/01/2008   Varicose veins of leg with pain, bilateral 11/02/2019   Family history of melanoma 12/16/2019   Pelvic pressure in female 03/20/2022   Neck pain 10/03/2020   Headache disorder 05/02/2020   Aphthous ulcer of mouth 08/11/2019   Anxiety, generalized 10/03/2020   Osteoarthritis of right hip 08/12/2022   Degeneration of intervertebral disc of lumbar region with discogenic back pain and lower extremity pain 09/25/2023   History of kyphoplasty- L1 09/25/2023   Resolved Ambulatory Problems    Diagnosis Date Noted   No Resolved Ambulatory Problems   Past Medical History:  Diagnosis Date   Arthritis    DDD (degenerative disc disease), lumbar    HLD (hyperlipidemia)    Hypertension    New onset left bundle branch block (LBBB) 07/31/2022   Constitutional Exam  General appearance: Well nourished, well developed, and well hydrated. In no apparent acute distress Vitals:   09/25/23 1000  BP: 136/84  Pulse: 69  Resp: 16  Temp: (!) 97.5 F (36.4 C)  TempSrc: Temporal  SpO2: 96%  Weight: 150 lb (68 kg)  Height: 5\' 8"  (1.727 m)   BMI Assessment: Estimated body mass index is 22.81 kg/m as calculated from the following:   Height as of this encounter: 5\' 8"  (1.727 m).   Weight as of this encounter: 150 lb (68 kg).  BMI interpretation table: BMI level Category Range association with higher incidence of chronic pain  <18 kg/m2 Underweight   18.5-24.9 kg/m2  Ideal body weight   25-29.9 kg/m2 Overweight Increased incidence by 20%  30-34.9 kg/m2 Obese (Class I) Increased incidence by 68%  35-39.9 kg/m2 Severe obesity (Class II) Increased incidence by 136%  >40 kg/m2 Extreme obesity (Class III) Increased incidence by 254%   Patient's current BMI Ideal Body weight  Body mass index is 22.81 kg/m. Ideal body weight: 63.9 kg (140 lb 14 oz) Adjusted ideal body weight: 65.6 kg (144 lb 8.4 oz)   BMI Readings from Last 4 Encounters:  09/25/23 22.81 kg/m  09/18/23 23.45 kg/m  09/15/23 23.26 kg/m  09/02/23 23.26 kg/m   Wt Readings from Last 4 Encounters:  09/25/23 150 lb (68 kg)  09/18/23 154 lb 3.2 oz (69.9 kg)  09/15/23 153 lb (69.4 kg)  09/02/23 153 lb (69.4 kg)    Psych/Mental status: Alert, oriented x 3 (person, place, & time)       Eyes: PERLA Respiratory: No evidence of acute respiratory distress  Thoracic Spine Area Exam  Skin & Axial Inspection: No masses, redness, or swelling Alignment: Symmetrical Functional ROM: Unrestricted ROM Stability: No instability detected Muscle Tone/Strength: Functionally intact. No obvious neuro-muscular anomalies detected. Sensory (Neurological): Unimpaired Muscle strength & Tone: No palpable anomalies Lumbar Spine Area Exam  Skin & Axial Inspection:  History of prior kyphoplasty Alignment: Scoliosis detected Functional ROM: Pain restricted ROM       Stability: No instability detected Muscle Tone/Strength: Functionally intact. No obvious neuro-muscular anomalies detected. Sensory (Neurological): Musculoskeletal pain pattern and neurogenic  Gait & Posture Assessment  Ambulation: Unassisted Gait: Relatively normal for age and body habitus Posture: WNL  Lower Extremity Exam    Side: Right lower extremity  Side: Left lower extremity  Stability: No instability observed          Stability: No instability observed          Skin & Extremity Inspection: Skin color, temperature, and hair growth are  WNL. No peripheral edema or cyanosis. No masses, redness, swelling, asymmetry, or associated skin lesions. No contractures.  Skin & Extremity Inspection: Skin color, temperature, and hair growth are WNL. No peripheral edema or cyanosis. No masses, redness, swelling, asymmetry, or associated skin lesions. No contractures.  Functional ROM: Unrestricted ROM                  Functional ROM: Pain restricted ROM for all joints of the lower extremity          Muscle Tone/Strength: Functionally intact. No obvious neuro-muscular anomalies detected.  Muscle Tone/Strength: Functionally intact. No obvious neuro-muscular anomalies detected.  Sensory (Neurological): Unimpaired        Sensory (Neurological): Neurogenic pain pattern        DTR: Patellar: deferred today Achilles: deferred today Plantar: deferred today  DTR: Patellar: deferred today Achilles: deferred today Plantar: deferred today  Palpation: No palpable anomalies  Palpation: No palpable anomalies    Assessment  Primary Diagnosis & Pertinent Problem List: The primary encounter diagnosis was Lumbar facet arthropathy. Diagnoses of Lumbar spondylosis, Degeneration of intervertebral disc of lumbar region with discogenic back pain and lower extremity pain, and History of kyphoplasty- L1 were also pertinent to this visit.  Visit Diagnosis (New problems to examiner): 1. Lumbar facet arthropathy   2. Lumbar spondylosis   3. Degeneration of intervertebral disc of lumbar region with discogenic back pain and lower extremity pain   4. History of kyphoplasty- L1    Plan of Care (Initial workup plan)  Assessment and Plan    Chronic pain related to multilevel lumbar degenerative disc disease, lumbar spondylosis most pronounced at L3-L4, L4-L5 and L5-S1.  History of L1 compression fracture status post L1 kyphoplasty Chronic pain affects the right side, radiating from the back to the hip and down the leg, due to lumbar degenerative disc disease, lumbar  spondylosis at L3-L4, L4-L5 and L5-S1.  Significant daily pain impacts her quality of life. A spinal cord stimulator is not recommended due to collapsed disc spaces and previous kyphoplasty at L1.  I was able to evaluate her interlaminar spaces under live fluoroscopy and they appear collapsed which would make percutaneous access challenging.  A peripheral nerve stimulator is considered as an alternative for managing low back and upper buttock pain, though it may not address radiating leg pain. This form of neuromodulation therapy provides stimulation to the L4 medial branch nerve for 60 days,   I explained to pt how Sprint peripheral nerve stimulation (PNS)  is typically considered for patients with chronic, localized pain that is  not responding to conservative treatments such as medications, physical therapy, or injections.  The SPRINT peripheral nerve stimulator is designed for short-term, percutaneous use (approximately 60 days) to modulate pain through targeted nerve stimulation. Unlike traditional permanent implants, SPRINT is temporary but can lead to long-lasting pain relief by altering pain signals.  We discussed the risks and benefits of peripheral nerve stimulation. Benefits: minimally invasive, does not require permanent implantation, can offer significant pain relief, improving function and quality of life, may reduce the need for long-term opioid use. The risks/challenges include (but not limited to):  infection or irritation at the stimulation site, discomfort from electrode placement, risk of incomplete pain relief or temporary relief post-removal, limited to short-term therapy, which may be a disadvantage in chronic, refractory cases  She will need a psych evaluation for a peripheral nerve stimulator.  I have ordered that below to [facility] and provided her with a brochure to call to schedule appointment.  If she does not obtain relief with Sprint peripheral nerve stimulation, she could  consider a surgical trial with neurosurgery.  Fracture of L1 vertebra   An L1 vertebra fracture with subsequent kyphoplasty was performed in October 2023. She has been on Fosamax  for a year and a half, but it has not been effective in improving bone density. Continue follow-up with the endocrinologist in early June for further management of bone health.       Referral Orders         Ambulatory referral to Psychology     Procedure Orders         Implantable Peripheral Nerve Stimulator    Provider-requested follow-up: Return in about 18 days (around 10/13/2023) for Right L4 PNS, in clinic [facility].  Future Appointments  Date Time Provider Department Center  10/02/2023 10:40 AM PCM-ANNUAL WELLNESS VISIT [facility]   I discussed the assessment and treatment plan with the patient. The patient was provided an opportunity to ask questions and all were answered. The patient agreed with the plan and demonstrated an understanding of the instructions.  Patient advised to call back or seek an in-person evaluation if the symptoms or condition worsens.  Duration of encounter: 60 mins Total time on encounter, as per AMA guidelines included both the face-to-face and non-face-to-face time personally spent by the physician and/or other qualified health care professional(s) on the day of the encounter (includes time in activities that require the physician or other qualified health care professional and does not include time in activities normally performed by clinical staff). Physician's time may include the following activities when performed: Preparing to see the patient (e.g., pre-charting review of records, searching for previously ordered imaging, lab work, and nerve conduction tests) Review of prior analgesic pharmacotherapies. Reviewing PMP Interpreting ordered tests (e.g., lab work, imaging, nerve conduction tests) Performing post-procedure evaluations, including interpretation of diagnostic  procedures Obtaining and/or reviewing separately obtained history Performing a medically appropriate examination and/or evaluation Counseling and educating the patient/family/caregiver Ordering medications, tests, or procedures Referring and communicating with other health care professionals (when not separately reported) Documenting clinical information in the electronic or other health record Independently interpreting results (not separately reported) and communicating results to the patient/ family/caregiver Care coordination (not separately reported)  Note by: Cephus Collin, MD (TTS and AI technology used. I apologize for any typographical errors that were not detected and corrected.) Date: 09/25/2023; Time: 11:38 AM

## 2023-09-25 NOTE — Progress Notes (Signed)
 Safety precautions to be maintained throughout the outpatient stay will include: orient to surroundings, keep bed in low position, maintain call bell within reach at all times, provide assistance with transfer out of bed and ambulation.

## 2023-10-06 ENCOUNTER — Ambulatory Visit: Payer: Medicare Other | Admitting: Family Medicine

## 2023-10-09 ENCOUNTER — Ambulatory Visit: Payer: Self-pay | Admitting: Family Medicine

## 2023-10-20 ENCOUNTER — Other Ambulatory Visit: Payer: Self-pay | Admitting: Student in an Organized Health Care Education/Training Program

## 2023-10-20 ENCOUNTER — Encounter: Payer: Self-pay | Admitting: Student in an Organized Health Care Education/Training Program

## 2023-10-20 ENCOUNTER — Ambulatory Visit: Admitting: Student in an Organized Health Care Education/Training Program

## 2023-10-20 ENCOUNTER — Ambulatory Visit
Admission: RE | Admit: 2023-10-20 | Discharge: 2023-10-20 | Disposition: A | Discharge status: Home / Self Care | Source: Ambulatory Visit | Attending: Student in an Organized Health Care Education/Training Program | Admitting: Student in an Organized Health Care Education/Training Program

## 2023-10-20 DIAGNOSIS — Z9889 Other specified postprocedural states: Secondary | ICD-10-CM | POA: Diagnosis present

## 2023-10-20 DIAGNOSIS — M51362 Other intervertebral disc degeneration, lumbar region with discogenic back pain and lower extremity pain: Secondary | ICD-10-CM | POA: Diagnosis present

## 2023-10-20 DIAGNOSIS — G894 Chronic pain syndrome: Secondary | ICD-10-CM

## 2023-10-20 DIAGNOSIS — M47816 Spondylosis without myelopathy or radiculopathy, lumbar region: Secondary | ICD-10-CM | POA: Diagnosis present

## 2023-10-20 MED ORDER — GLYCOPYRROLATE 0.2 MG/ML IJ SOLN
0.2000 mg | Freq: Once | INTRAMUSCULAR | Status: AC
  Administered 2023-10-20: 0.2 mg via INTRAMUSCULAR

## 2023-10-20 MED ORDER — GLYCOPYRROLATE 0.2 MG/ML IJ SOLN
INTRAMUSCULAR | Status: AC
  Filled 2023-10-20: qty 1

## 2023-10-20 MED ORDER — LIDOCAINE HCL 2 % IJ SOLN
20.0000 mL | Freq: Once | INTRAMUSCULAR | Status: AC
  Administered 2023-10-20: 400 mg

## 2023-10-20 MED ORDER — ROPIVACAINE HCL 2 MG/ML IJ SOLN
INTRAMUSCULAR | Status: AC
  Filled 2023-10-20: qty 20

## 2023-10-20 MED ORDER — LIDOCAINE HCL (PF) 2 % IJ SOLN
INTRAMUSCULAR | Status: AC
  Filled 2023-10-20: qty 10

## 2023-10-20 MED ORDER — ROPIVACAINE HCL 2 MG/ML IJ SOLN
9.0000 mL | Freq: Once | INTRAMUSCULAR | Status: AC
  Administered 2023-10-20: 9 mL via PERINEURAL

## 2023-10-20 MED ORDER — LIDOCAINE HCL 2 % IJ SOLN
INTRAMUSCULAR | Status: AC
  Filled 2023-10-20: qty 20

## 2023-10-20 NOTE — Progress Notes (Signed)
 PROVIDER NOTE: Interpretation of information contained herein should be left to medically-trained personnel. Specific patient instructions are provided elsewhere under "Patient Instructions" section of medical record. This document was created in part using STT-dictation technology, any transcriptional errors that may result from this process are unintentional.  Patient: Debbie Montgomery Type: Established DOB: [birth year] MRN: [id number] PCP: Clarise Crooks, MD  Service: Procedure DOS: 10/20/2023 Setting: Ambulatory Location: Ambulatory outpatient facility Delivery: Face-to-face Provider: Cephus Collin, MD Specialty: Interventional Pain Management Specialty designation: 09 Location: Outpatient facility Ref. Prov.: Cephus Collin, MD       Interventional Therapy   Primary Reason for Admission: Surgical management of chronic pain condition.  Procedure:  Anesthesia, Analgesia, Anxiolysis:  Type: SPRINTTM Peripheral Nerve Field Stimulator (PNS) MicroLeadTM Implant Purpose: Therapeutic Region: Lumbosacral Level: L4-5 Facet Medial Branch Nerve Laterality: Right           Anesthesia: Local (1-2% Lidocaine )  Anxiolysis: None  Sedation: None  Guidance: Fluoroscopy            1. Lumbar facet arthropathy   2. Lumbar spondylosis   3. Degeneration of intervertebral disc of lumbar region with discogenic back pain and lower extremity pain   4. History of kyphoplasty- L1    NAS-11 Pain score:   Pre-procedure: 4 /10   Post-procedure: 4 /10   H&P (Pre-op Assessment):  Debbie Montgomery is a 78 y.o. (year old), female patient, seen today for interventional treatment. 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  Initial Vital Signs:  Pulse/EKG Rate: 78ECG Heart Rate: 80 Temp: (!) 97.2 F (36.2 C) Resp: 16 BP: 122/88 SpO2: 100 %  BMI: Estimated body mass index is 22.81 kg/m as calculated from the following:   Height as of this encounter: 5\' 8"  (1.727 m).   Weight as of this encounter: 150 lb (68 kg).  Risk Assessment: Allergies: Reviewed. She has no known allergies.  Allergy Precautions: None required Coagulopathies: Reviewed. None identified.  Blood-thinner therapy: None at this time Active Infection(s): Reviewed. None identified. Debbie Montgomery is afebrile  Site Confirmation: Debbie Montgomery was asked to confirm the procedure and laterality before marking the site, which she did. Procedure checklist: Completed Consent: Before the procedure and under the influence of no sedative(s), amnesic(s), or anxiolytics, the patient was informed of the treatment options, risks and possible complications. To fulfill our ethical and legal obligations, as recommended by the American Medical Association's Code of Ethics, I have informed the patient of my clinical impression; the nature and purpose of the treatment or procedure; the risks, benefits, and possible complications of the intervention; the alternatives, including doing nothing; the risk(s) and benefit(s) of the alternative treatment(s) or procedure(s); and the risk(s) and benefit(s) of doing nothing.  Debbie Montgomery was provided with information about the general risks and possible complications associated with most interventional procedures. These include, but are not limited to: failure to achieve desired goals, infection, bleeding, organ or nerve damage, allergic reactions, paralysis, and/or death.  In addition, she was informed of those risks and possible complications associated to this particular procedure, which include, but are not  limited to: damage to the implant; failure to decrease pain; local, systemic, or serious CNS  infections, intraspinal abscess with possible cord compression and paralysis, or life-threatening such as meningitis; intrathecal and/or epidural bleeding with formation of hematoma with possible spinal cord compression and permanent paralysis; organ damage; nerve injury or damage with subsequent sensory, motor, and/or autonomic system dysfunction, resulting in transient or permanent pain, numbness, and/or weakness of one or several areas of the body; allergic reactions, either minor or major life-threatening, such as anaphylactic or anaphylactoid reactions.  Furthermore, Debbie Montgomery was informed of those risks and complications associated with the medications. These include, but are not limited to: allergic reactions (i.e.: anaphylactic or anaphylactoid reactions); arrhythmia;  Hypotension/hypertension; cardiovascular collapse; respiratory depression and/or shortness of breath; swelling or edema; medication-induced neural toxicity; particulate matter embolism and blood vessel occlusion with resultant organ, and/or nervous system infarction and permanent paralysis.  Finally, she was informed that Medicine is not an exact science; therefore, there is also the possibility of unforeseen or unpredictable risks and/or possible complications that may result in a catastrophic outcome. The patient indicated having understood very clearly. We have given the patient no guarantees and we have made no promises. Enough time was given to the patient to ask questions, all of which were answered to the patient's satisfaction. Debbie Montgomery has indicated that she wanted to continue with the procedure. Attestation: I, the ordering provider, attest that I have discussed with the patient the benefits, risks, side-effects, alternatives, likelihood of achieving goals, and potential problems during recovery for the procedure that I have  provided informed consent. Date  Time: 10/20/2023  7:53 AM  Pre-Procedure Preparation:  Monitoring: As per clinic protocol. Respiration, ETCO2, SpO2, BP, heart rate and rhythm monitor placed and checked for adequate function Safety Precautions: Patient was assessed for positional comfort and pressure points before starting the procedure. Time-out: I initiated and conducted the "Time-out" before starting the procedure, as per protocol. The patient was asked to participate by confirming the accuracy of the "Time Out" information. Verification of the correct person, site, and procedure were performed and confirmed by me, the nursing staff, and the patient. "Time-out" conducted as per Joint Commission's Universal Protocol (UP.01.01.01). Time: 0921 Start Time: 0921 hrs.  Description of Procedure Process:   Position: Prone Target Area: <1 cm from targeted nerve (Medial Branch Nerve) Approach: Posterior percutaneous, paramedial, interlaminar approach Area Prepped: Entire Lumbosacral Region Prepping solution: ChloraPrep (2% chlorhexidine  gluconate and 70% isopropyl alcohol) Safety Precautions: Safe injection practices and needle disposal techniques used. Medications properly checked for expiration dates. SDV (single dose vial) medications used. Aspiration looking for blood return and/or CSF was conducted prior to all injections. At no point did I inject any substances, as a needle was being advanced. No attempts were made at seeking any paresthesias.  Description of the Procedure (Lumbar Medial Branch):  Availability of a responsible, adult driver, and NPO status confirmed. Informed consent was obtained after having discussed risks and possible complications. An IV was started. The patient was then taken to the fluoroscopy suite, where the patient was placed in position for the procedure, over the fluoroscopy table. The patient was then monitored in the usual manner. Fluoroscopy was manipulated to obtain  the best possible view of the target. Parallex error was corrected before commencing the procedure. Once a clear view of the target had been obtained, the skin and subcutaneous tissue over the procedure site were infiltrated using lidocaine , loaded in a 10 cc luer-loc syringe with a 0.5 inch, 25-G needle. Care was taken to avoid numbing deeper tissues The introducer needle(s) was/were  then inserted through the skin and deeper tissues, specifically the multifidus muscle.  An introducer needle and stimulating probe were  assembled, inserted and advanced along the intended course of the medial branch nerve as it traverses the lamina medial and inferior to the zygapophyseal joint, taking care to maintain the proper depth of insertion as the introducer is advanced under fluoroscopic. The introducer needle was delivered to a location in proximity to the nerve. Multiple stimulation parameters were used to deliver stimulation to the medial branch nerve in concert with stimulating at multiple positions around the nerve. Nerve target acquisition was confirmed noting generation of paresthesias in the paravertebral regions corresponding to the level being stimulated as well as rhythmic thumping within the multifidi, the latter being further corroborated via palpation.  Various electrical parameter combinations were tested, and the lead location was adjusted (physically relocated) until the patient indicated paresthesia or muscle tension overlapping the distribution of the patient's typical region of pain.  The stimulating probe was removed from the introducer and a percutaneous lead was guided through the needle and delivered to a location in similar proximity to the nerve. Final  location was verified with electrical stimulation and documented with fluoroscopy & ultrasound.  The introducer needle was removed, and the exposed end of the percutaneous lead was attached to an external stimulator unit. Various electrical  parameter combinations were again  tested until the patient indicated paresthesia or muscle tension  overlapping the distribution of the patient's typical region of pain.  After confirming that lead impedance was in the normal range, the external unit was detached, the needle was removed, and the lead was anchored at the skin. The lead was threaded into the connector block and electrical continuity and desired patient response was confirmed. The connector block was attached to the external  stimulator unit.The site was covered with a sterile occlusive dressing and  a fluoroscopic and ultrasound image was taken to document final placement. The patient was observed for stability of vital signs and comfort.  Vitals:   10/20/23 0915 10/20/23 0920 10/20/23 0925 10/20/23 0930  BP: 138/83 (!) 146/86 (!) 138/100 (!) 146/86  Pulse:      Resp: 16 16 17 20   Temp:      TempSrc:      SpO2: 96% 97% 97% 98%  Weight:      Height:       Start Time: 0921 hrs. End Time: 0929 hrs.  Imaging Guidance (Spinal):          Type of Imaging Technique: Fluoroscopy Guidance (Spinal) and ultrasound guidance to confirm multifidus activation Indication(s): Fluoroscopy guidance for needle placement to enhance accuracy in procedures requiring precise needle localization for targeted delivery of medication in or near specific anatomical locations not easily accessible without such real-time imaging assistance. Exposure Time: Please see nurses notes. Contrast: None used. Fluoroscopic Guidance: I was personally present during the use of fluoroscopy. "Tunnel Vision Technique" used to obtain the best possible view of the target area. Parallax error corrected before commencing the procedure. "Direction-depth-direction" technique used to introduce the needle under continuous pulsed fluoroscopy. Once target was reached, antero-posterior, oblique, and lateral fluoroscopic projection used confirm needle placement in all planes. Images  permanently stored in EMR. Interpretation: No contrast injected. I personally interpreted the imaging intraoperatively. Adequate needle placement confirmed in multiple planes. Permanent images saved into the patient's record.  Antibiotic Prophylaxis:   Anti-infectives (From admission, onward)    None      Indication(s): None identified  Post-operative Assessment:  Post-procedure Vital Signs:  Pulse/HCG Rate: 7875 Temp: (!) 97.2 F (36.2 C) Resp: 20 BP: (!) 146/86 SpO2: 98 %  Complications: No immediate post-treatment complications observed by team, or reported by patient.  Note: Of note, the patient started to endorse nausea and experienced a vasovagal rx.  She became bradycardic and hypotensive but was still alert and oriented to person place and time.  Procedure was paused.  She was given 0.2 mg of IM glycopyrrolate  and her vital signs improved.  She was feeling better and elected to resume and finish the procedure which she tolerated well after that episode without any further issues.  A repeat set of vitals were taken after the procedure and the patient was kept under observation following institutional policy, for this type of procedure. Post-procedural neurological assessment was performed, showing return to baseline, prior to discharge. The patient was provided with post-procedure discharge instructions, including a section on how to identify potential problems. Should any problems arise concerning this procedure, the patient was given instructions to immediately contact us , at any time, without hesitation. In any case, we plan to contact the patient by telephone for a follow-up status report regarding this interventional procedure.  Comments:  No additional relevant information.  Plan of Care  Orders:  No orders of the defined types were placed in this encounter.    Medications administered: We administered lidocaine  and ropivacaine  (PF) 2 mg/mL (0.2%).  See the medical  record for exact dosing, route, and time of administration.  Follow-up plan:   Return in about 4 weeks (around 11/17/2023) for PPE f/u PNS.       Right L4 PNS 10/20/23    Recent Visits Date Type Provider Dept  09/25/23 Office Visit Cephus Collin, MD [facility]  Showing recent visits within past 90 days and meeting all other requirements Today's Visits Date Type Provider Dept  10/20/23 Procedure visit Cephus Collin, MD [facility]  Showing today's visits and meeting all other requirements Future Appointments Date Type Provider Dept  11/27/23 Appointment Cephus Collin, MD [facility]  Showing future appointments within next 90 days and meeting all other requirements  Disposition: Discharge home  Discharge (Date  Time): 10/20/2023; 0935 hrs.   Primary Care Physician: Clarise Crooks, MD Location: [facility] Note by: Cephus Collin, MD (TTS technology used. I apologize for any typographical errors that were not detected and corrected.) Date: 10/20/2023; Time: 10:04 AM

## 2023-10-20 NOTE — Patient Instructions (Signed)

## 2023-10-20 NOTE — Progress Notes (Signed)
 Safety precautions to be maintained throughout the outpatient stay will include: orient to surroundings, keep bed in low position, maintain call bell within reach at all times, provide assistance with transfer out of bed and ambulation.  In recovery room.  VSS HOB elevated. Taking ice chips. States she feels much better.   VSS returned to procedure room.

## 2023-10-21 ENCOUNTER — Telehealth: Payer: Self-pay | Admitting: *Deleted

## 2023-10-21 NOTE — Telephone Encounter (Signed)
 Post procedure call; reports that she did fine, sore at injection site, used ice and is better.

## 2023-11-06 ENCOUNTER — Telehealth: Payer: Self-pay

## 2023-11-06 NOTE — Telephone Encounter (Signed)
 Copied from CRM [id number]. Topic: Appointments - Appointment Scheduling >> Nov 05, 2023  3:37 PM [place] wrote: Patient needs to schedule annual wellness and physical- system will not allow to change the provider and schedule- [phone]

## 2023-11-27 ENCOUNTER — Ambulatory Visit
Attending: Student in an Organized Health Care Education/Training Program | Admitting: Student in an Organized Health Care Education/Training Program

## 2023-11-27 ENCOUNTER — Encounter: Payer: Self-pay | Admitting: Student in an Organized Health Care Education/Training Program

## 2023-11-27 VITALS — BP 130/74 | HR 78 | Temp 98.4°F | Ht 68.0 in | Wt 150.0 lb

## 2023-11-27 DIAGNOSIS — M47816 Spondylosis without myelopathy or radiculopathy, lumbar region: Secondary | ICD-10-CM | POA: Insufficient documentation

## 2023-11-27 NOTE — Progress Notes (Signed)
 PROVIDER NOTE: Interpretation of information contained herein should be left to medically-trained personnel. Specific patient instructions are provided elsewhere under Patient Instructions section of medical record. This document was created in part using AI and STT-dictation technology, any transcriptional errors that may result from this process are unintentional.  Patient: Debbie Montgomery  Service: E/M   PCP: Joshua Cathryne BROCKS, MD (Inactive)  DOB: Apr 17, 1946  DOS: 11/27/2023  Provider: Wallie Sherry, MD  MRN: [id number]  Delivery: Face-to-face  Specialty: Interventional Pain Management  Type: Established Patient  Setting: Ambulatory outpatient facility  Specialty designation: 09  Referring Prov.: Joshua Cathryne BROCKS, MD  Location: Outpatient office facility       History of present illness (HPI) [name], a 78 y.o. year old female, is here today because of her Lumbar facet arthropathy [M47.816]. Ms. Vanrossum primary complain today is Hip Pain (right)    Pain Assessment: Severity of Chronic pain is reported as a 2 /10. Location: Hip Right/leg are much better. Onset: More than a month ago. Quality: Aching. Timing: Intermittent. Modifying factor(s): sprint and laying down. Vitals:  height is 5' 8 (1.727 m) and weight is 150 lb (68 kg). Her temperature is 98.4 F (36.9 C). Her blood pressure is 130/74 and her pulse is 78. Her oxygen saturation is 97%.  BMI: Estimated body mass index is 22.81 kg/m as calculated from the following:   Height as of this encounter: 5' 8 (1.727 m).   Weight as of this encounter: 150 lb (68 kg).  Last encounter: 09/25/2023. Last procedure: 10/20/2023.  Reason for encounter:   Patient presents today for mid therapy follow-up for her right L4-L5 Sprint peripheral nerve stimulator.  She is endorsing pain relief and states that it is easier for her to ambulate and perform activities of daily living.  She states that her friend changed her bandage  site this past Sunday and she feels like that since that change her stimulation parameters have changed and is concerned about the lead having moved.  I examined her insertion site and the Dermabond is intact suggesting that the lead is still in the correct place.  She is pleased with the results thus far.  She will follow-up with me in 30 days for PNS lead removal.   ROS  Constitutional: Denies any fever or chills Gastrointestinal: No reported hemesis, hematochezia, vomiting, or acute GI distress Musculoskeletal: Denies any acute onset joint swelling, redness, loss of ROM, or weakness Neurological: No reported episodes of acute onset apraxia, aphasia, dysarthria, agnosia, amnesia, paralysis, loss of coordination, or loss of consciousness  Medication Review  Aspirin-Acetaminophen -Caffeine, FLUoxetine , Fiber, OVER THE COUNTER MEDICATION, acetaminophen , atorvastatin , diphenhydramine-acetaminophen , and hydrochlorothiazide   History Review  Allergy: [name] has no known allergies. Drug: [name]  reports no history of drug use. Alcohol:  reports current alcohol use of about 1.0 standard drink of alcohol per week. Tobacco:  reports that she has quit smoking. She has never used smokeless tobacco. Social: Ms. Dress  reports that she has quit smoking. She has never used smokeless tobacco. She reports current alcohol use of about 1.0 standard drink of alcohol per week. She reports that she does not use drugs. Medical:  has a past medical history of Arthritis, DDD (degenerative disc disease), lumbar, HLD (hyperlipidemia), Hypertension, and New onset left bundle branch block (LBBB) (07/31/2022). Surgical: 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 Family: family history includes Heart attack in her maternal grandmother; Heart disease in her father; Hypertension in her mother; Stroke in her maternal grandmother.  Laboratory Chemistry Profile   Renal Lab Results  Component Value Date   BUN 25 04/21/2023   CREATININE 0.65 04/21/2023   BCR 38 (H) 04/21/2023   GFRAA 103 12/17/2019   GFRNONAA >60 08/13/2022    Hepatic Lab Results  Component Value Date   AST 17 07/31/2022   ALT 12 07/31/2022   ALBUMIN 4.6 04/21/2023   ALKPHOS 28 (L) 07/31/2022    Electrolytes Lab Results  Component Value Date   NA 142 04/21/2023   K 4.3 04/21/2023   CL 100 04/21/2023   CALCIUM  10.1 04/21/2023   PHOS 4.3 04/21/2023    Bone No results found for: VD25OH, VD125OH2TOT, CI6874NY7, CI7874NY7, 25OHVITD1, 25OHVITD2, 25OHVITD3, TESTOFREE, TESTOSTERONE  Inflammation (CRP: Acute Phase) (ESR: Chronic Phase) No results found for: CRP, ESRSEDRATE, LATICACIDVEN       Note: Above Lab results reviewed.  Recent Imaging Review  DG PAIN CLINIC C-ARM 1-60 MIN NO REPORT Fluoro was used, but no Radiologist interpretation will be provided.  Please refer to NOTES tab for provider progress note. Note: Reviewed        Physical Exam  General appearance: Well nourished, well developed, and well hydrated. In no apparent acute distress Mental status: Alert, oriented x 3 (person, place, & time)       Respiratory: No evidence of acute respiratory distress Eyes: PERLA Vitals: BP 130/74   Pulse 78   Temp 98.4 F (36.9 C)   Ht 5' 8 (1.727 m)   Wt 150 lb (68 kg)   SpO2 97%   BMI 22.81 kg/m  BMI: Estimated body mass index is 22.81 kg/m as calculated from the following:   Height as of this encounter: 5' 8 (1.727 m).   Weight as of this encounter: 150 lb (68  kg). Ideal: Ideal body weight: 63.9 kg (140 lb 14 oz) Adjusted ideal body weight: 65.6 kg (144 lb 8.4 oz)  Assessment   Diagnosis  1. Lumbar facet arthropathy   2. Lumbar spondylosis      Updated Problems: No problems updated.  Plan of Care  Continue with Sprint right L4 medial branch peripheral nerve stimulation.  Lead has not migrated.  Continue working with Technical sales engineer.  Follow-up in 30 days for explant.    Right L4 PNS 10/20/23    Return in about 4 weeks (around 12/25/2023) for PNS lead pull.    Recent Visits Date Type Provider Dept  10/20/23 Procedure visit Marcelino Nurse, MD [facility]  09/25/23 Office Visit Marcelino Nurse, MD [facility]  Showing recent visits within past 90 days and meeting all other requirements Today's Visits Date Type Provider Dept  11/27/23 Office Visit Marcelino Nurse, MD [facility]  Showing today's visits and meeting all other requirements Future Appointments Date Type Provider Dept  12/24/23 Appointment Marcelino Nurse, MD [facility]  Showing future appointments within next 90 days and meeting all other requirements  I discussed the assessment and treatment plan with the patient. The patient was provided an opportunity to ask questions and all were answered. The patient agreed with the plan and demonstrated an understanding of the instructions.  Patient advised  to call back or seek an in-person evaluation if the symptoms or condition worsens.  Duration of encounter: .  Total time on encounter, as per AMA guidelines included both the face-to-face and non-face-to-face time personally spent by the physician and/or other qualified health care professional(s) on the day of the encounter (includes time in activities that require the physician or other qualified health care professional and does not include time in activities normally performed by clinical staff). Physician's time may include the  following activities when performed: Preparing to see the patient (e.g., pre-charting review of records, searching for previously ordered imaging, lab work, and nerve conduction tests) Review of prior analgesic pharmacotherapies. Reviewing PMP Interpreting ordered tests (e.g., lab work, imaging, nerve conduction tests) Performing post-procedure evaluations, including interpretation of diagnostic procedures Obtaining and/or reviewing separately obtained history Performing a medically appropriate examination and/or evaluation Counseling and educating the patient/family/caregiver Ordering medications, tests, or procedures Referring and communicating with other health care professionals (when not separately reported) Documenting clinical information in the electronic or other health record Independently interpreting results (not separately reported) and communicating results to the patient/ family/caregiver Care coordination (not separately reported)  Note by: Wallie Sherry, MD (TTS and AI technology used. I apologize for any typographical errors that were not detected and corrected.) Date: 11/27/2023; Time: 9:10 AM

## 2023-11-27 NOTE — Progress Notes (Signed)
 Safety precautions to be maintained throughout the outpatient stay will include: orient to surroundings, keep bed in low position, maintain call bell within reach at all times, provide assistance with transfer out of bed and ambulation.

## 2023-12-24 ENCOUNTER — Ambulatory Visit (HOSPITAL_BASED_OUTPATIENT_CLINIC_OR_DEPARTMENT_OTHER): Admitting: Student in an Organized Health Care Education/Training Program

## 2023-12-24 ENCOUNTER — Encounter: Payer: Self-pay | Admitting: Student in an Organized Health Care Education/Training Program

## 2023-12-24 ENCOUNTER — Ambulatory Visit
Admission: RE | Admit: 2023-12-24 | Discharge: 2023-12-24 | Disposition: A | Discharge status: Home / Self Care | Source: Ambulatory Visit | Attending: Student in an Organized Health Care Education/Training Program | Admitting: Student in an Organized Health Care Education/Training Program

## 2023-12-24 ENCOUNTER — Other Ambulatory Visit: Payer: Self-pay | Admitting: Student in an Organized Health Care Education/Training Program

## 2023-12-24 VITALS — BP 131/79 | HR 81 | Temp 97.4°F | Resp 18 | Ht 68.0 in | Wt 150.0 lb

## 2023-12-24 DIAGNOSIS — G894 Chronic pain syndrome: Secondary | ICD-10-CM | POA: Insufficient documentation

## 2023-12-24 DIAGNOSIS — M47816 Spondylosis without myelopathy or radiculopathy, lumbar region: Secondary | ICD-10-CM | POA: Insufficient documentation

## 2023-12-24 DIAGNOSIS — M51362 Other intervertebral disc degeneration, lumbar region with discogenic back pain and lower extremity pain: Secondary | ICD-10-CM

## 2023-12-24 MED ORDER — GABAPENTIN 300 MG PO CAPS: ORAL_CAPSULE | ORAL | 0 refills | Status: DC

## 2023-12-24 NOTE — Progress Notes (Signed)
 Safety precautions to be maintained throughout the outpatient stay will include: orient to surroundings, keep bed in low position, maintain call bell within reach at all times, provide assistance with transfer out of bed and ambulation.

## 2023-12-24 NOTE — Progress Notes (Signed)
 0825 Lead pull per Dr. Marcelino. Lead intact. Site clear. Wound care instructions given.

## 2023-12-24 NOTE — Patient Instructions (Addendum)
 Please get approval for left L4 Sprint peripheral nerve stimulator, patient will discuss with her daughter and let us  know if/when she would like to get it done.

## 2023-12-24 NOTE — Progress Notes (Signed)
 PROVIDER NOTE: Interpretation of information contained herein should be left to medically-trained personnel. Specific patient instructions are provided elsewhere under Patient Instructions section of medical record. This document was created in part using AI and STT-dictation technology, any transcriptional errors that may result from this process are unintentional.  Patient: Debbie Montgomery  Service: E/M   PCP: Joshua Cathryne BROCKS, MD (Inactive)  DOB: Nov 18, 1945  DOS: 12/24/2023  Provider: Wallie Sherry, MD  MRN: [id number]  Delivery: Face-to-face  Specialty: Interventional Pain Management  Type: Established Patient  Setting: Ambulatory outpatient facility  Specialty designation: 09  Referring Prov.: No ref. provider found  Location: Outpatient office facility       History of present illness (HPI) Debbie Montgomery, a 78 y.o. year old female, is here today because of her Lumbar facet arthropathy [M47.816]. Ms. Somerville's primary complain today is Back Pain   Pain Assessment: Severity of Chronic pain is reported as a 0-No pain/10. Location: Back Right/Feeling much better. Onset: More than a month ago. Quality: Aching. Timing: Intermittent. Modifying factor(s): Rest, procedure. Vitals:  height is 5' 8 (1.727 m) and weight is 150 lb (68 kg). Her temperature is 97.4 F (36.3 C) (abnormal). Her blood pressure is 131/79 and her pulse is 81. Her respiration is 18 and oxygen saturation is 95%.  BMI: Estimated body mass index is 22.81 kg/m as calculated from the following:   Height as of this encounter: 5' 8 (1.727 m).   Weight as of this encounter: 150 lb (68 kg).  Last encounter: 11/27/2023. Last procedure: 10/20/2023.  Reason for encounter:   Patient presents today for removal of her right L4-L5 Sprint peripheral nerve stimulator.  She endorses analgesic benefit and functional response during her treatment.  She endorses pain on her left side and we discussed left L4 Sprint peripheral  nerve stimulator.  She will think about this further and let us  know.  She is pleased with the results that she experienced on the right side.  She states that her right low back and radiating right hip and right posterior lateral leg pain are improved.  She is in a left boot.  She is wondering what options she has to help address the neuropathic pain in both of her feet.  She has tried gabapentin  in the past albeit low-dose at 200 mg.  We discussed retrialing gabapentin  with dose escalation.  Recommend 300 mg nightly and then increase to 600 mg nightly.  ROS  Constitutional: Denies any fever or chills Gastrointestinal: No reported hemesis, hematochezia, vomiting, or acute GI distress Musculoskeletal: Left axial low back pain Neurological: No reported episodes of acute onset apraxia, aphasia, dysarthria, agnosia, amnesia, paralysis, loss of coordination, or loss of consciousness  Medication Review  Aspirin-Acetaminophen -Caffeine, FLUoxetine , Fiber, OVER THE COUNTER MEDICATION, acetaminophen , atorvastatin , denosumab, diphenhydramine-acetaminophen , gabapentin , and hydrochlorothiazide   History Review  Allergy: Debbie Montgomery has no known allergies. Drug: Debbie Montgomery  reports no history of drug use. Alcohol:  reports current alcohol use of about 1.0 standard drink of alcohol per week. Tobacco:  reports that she has quit smoking. She has never used smokeless tobacco. Social: Ms. Caracci  reports that she has quit smoking. She has never used smokeless tobacco. She reports current alcohol use of about 1.0 standard drink of alcohol per week. She reports that she does not use drugs. Medical:  has a past medical history of Arthritis, DDD (degenerative disc disease), lumbar, HLD (hyperlipidemia), Hypertension, and New onset left bundle branch block (LBBB) (07/31/2022). 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 Family: family history includes Heart attack in her maternal grandmother; Heart disease in her father; Hypertension in her mother; Stroke in her maternal grandmother.  Laboratory Chemistry Profile   Renal Lab Results  Component Value Date   BUN 25 04/21/2023   CREATININE 0.65 04/21/2023   BCR 38 (H) 04/21/2023   GFRAA 103 12/17/2019   GFRNONAA >60 08/13/2022    Hepatic Lab Results  Component Value Date   AST 17 07/31/2022   ALT 12 07/31/2022   ALBUMIN 4.6 04/21/2023   ALKPHOS 28 (L) 07/31/2022    Electrolytes Lab Results  Component Value Date   NA 142 04/21/2023   K 4.3 04/21/2023   CL 100 04/21/2023   CALCIUM  10.1 04/21/2023   PHOS 4.3 04/21/2023    Bone No results found for: VD25OH, VD125OH2TOT, CI6874NY7, CI7874NY7, 25OHVITD1, 25OHVITD2, 25OHVITD3, TESTOFREE, TESTOSTERONE  Inflammation (CRP: Acute Phase) (ESR: Chronic Phase) No results found for: CRP, ESRSEDRATE, LATICACIDVEN       Note: Above Lab results reviewed.    Physical Exam  Vitals: BP 131/79 (BP Location: Left Arm, Patient Position: Sitting)   Pulse 81   Temp (!) 97.4 F (36.3 C)   Resp 18   Ht 5' 8 (1.727 m)   Wt 150 lb (68 kg)   SpO2 95%   BMI 22.81 kg/m  BMI: Estimated body mass index is 22.81 kg/m as calculated from the following:   Height as of this encounter: 5' 8 (1.727 m).   Weight as of this encounter: 150 lb (68 kg). Ideal: Ideal body weight: 63.9 kg (140 lb 14 oz) Adjusted ideal body weight: 65.6 kg (144 lb 8.4 oz) General appearance: Well nourished, well developed, and well hydrated. In no apparent acute distress Mental  status: Alert, oriented x 3 (person, place, & time)       Respiratory: No evidence of acute respiratory distress Eyes: PERLA  Sprint peripheral nerve stimulator lead removed with tip intact  Left axial low back pain Assessment   Diagnosis Status  1. Lumbar facet arthropathy   2. Lumbar spondylosis   3. Degeneration of intervertebral disc of lumbar region with discogenic back pain and lower extremity pain    Controlled Controlled Controlled   Updated Problems: No problems updated.  Plan of Care  Patient is status post positive right L4 Sprint peripheral nerve stimulation treatment.  Stimulator lead was removed today.  Tips intact.  She is pleased with the response on her right side and is interested in considering L4 medial branch stimulation for the left side as well.  We talked about this procedure at length.  It is very similar to what she just experienced with the right side.  For her neuropathic pain of bilateral feet recommend gabapentin  as below  Ms. Debbie Caldron Bouch has a current medication list which includes the following long-term medication(s): atorvastatin , diphenhydramine-acetaminophen , fluoxetine , gabapentin , and hydrochlorothiazide .  Pharmacotherapy (Medications Ordered): Meds ordered this encounter  Medications   gabapentin  (NEURONTIN ) 300 MG capsule    Sig: Take 1 capsule (300 mg total) by mouth at bedtime for 15 days, THEN 2 capsules (600 mg total) at bedtime.    Dispense:  105 capsule    Refill:  0   Orders:  Orders Placed This Encounter  Procedures   Implantable Peripheral Nerve Stimulator    Standing Status:   Future    Expected Date:   01/26/2024    Expiration Date:   12/23/2024    Scheduling Instructions:     Procedure: Temporary implantable peripheral nerve stimulator     Side:  LEFT     Nerve site: L4     Sedation: Patient's choice.     Timeframe: ASAA    Location of Procedure:   [facility]     Right L4 PNS 10/20/23      Return in about 4 weeks (around 01/21/2024) for MM, F2F.    Recent Visits Date Type Provider Dept  11/27/23 Office Visit Marcelino Nurse, MD [facility]  10/20/23 Procedure visit Marcelino Nurse, MD [facility]  09/25/23 Office Visit Marcelino Nurse, MD [facility]  Showing recent visits within past 90 days and meeting all other requirements Today's Visits Date Type Provider Dept  12/24/23 Procedure visit Marcelino Nurse, MD [facility]  Showing today's visits and meeting all other requirements Future Appointments Date Type Provider Dept  01/20/24 Appointment Marcelino Nurse, MD [facility]  Showing future appointments within next 90 days and meeting all other requirements  I discussed the assessment and treatment plan with the patient. The patient was provided an opportunity to ask questions and all were answered. The patient agreed with the plan and demonstrated an understanding of the instructions.  Patient advised to call back or seek an in-person evaluation if the symptoms or condition worsens.  Duration of encounter: .  Total time on encounter, as per AMA guidelines included both the face-to-face and non-face-to-face time personally spent by the physician and/or other qualified health care professional(s) on the day of the encounter (includes time in activities that require the physician or other qualified health care professional and does not include time in activities normally performed by clinical staff). Physician's time may include the following activities when performed: Preparing to see the patient (e.g., pre-charting review of records, searching for previously ordered imaging, lab work, and nerve conduction tests) Review of prior analgesic pharmacotherapies. Reviewing PMP Interpreting ordered tests (e.g., lab work, imaging, nerve conduction tests) Performing post-procedure evaluations, including interpretation of diagnostic  procedures Obtaining and/or reviewing separately obtained history Performing a medically appropriate examination and/or evaluation Counseling and educating the patient/family/caregiver Ordering medications, tests, or procedures Referring and communicating with other health care professionals (when not separately reported) Documenting clinical information in the electronic or other health record Independently interpreting results (not separately reported) and communicating results to the patient/ family/caregiver Care coordination (not separately reported)  Note by: Nurse Marcelino, MD (TTS and AI technology used. I apologize for any typographical errors that were not detected and corrected.) Date: 12/24/2023; Time: 8:57 AM

## 2023-12-25 ENCOUNTER — Telehealth: Payer: Self-pay

## 2023-12-25 NOTE — Telephone Encounter (Signed)
 I have her scheduled for her left PNS on 8/27. You had 8/25 but you are not here that day. She is leaving for [place]  on 10/11 and she knows it is a 2 month trial. She wont be back till 10/25. Can you do this PNS sooner so she can have it done before she leaves for vacation?

## 2023-12-29 NOTE — Telephone Encounter (Addendum)
 She is going out of town starting 7/31 thru 8/11 what now  She said she could wait until after her [place]  trip because she wants to be able to have it off before she goes so she can get in the water .

## 2023-12-29 NOTE — Telephone Encounter (Signed)
 Please advise if I can schedule this for earlier in August.

## 2023-12-29 NOTE — Telephone Encounter (Signed)
 Ok 8/13 works for her. I put her at 8am

## 2024-01-14 ENCOUNTER — Encounter: Payer: Self-pay | Admitting: Student in an Organized Health Care Education/Training Program

## 2024-01-14 ENCOUNTER — Ambulatory Visit
Admission: RE | Admit: 2024-01-14 | Discharge: 2024-01-14 | Disposition: A | Discharge status: Home / Self Care | Source: Ambulatory Visit | Attending: Student in an Organized Health Care Education/Training Program | Admitting: Student in an Organized Health Care Education/Training Program

## 2024-01-14 ENCOUNTER — Ambulatory Visit: Admitting: Student in an Organized Health Care Education/Training Program

## 2024-01-14 DIAGNOSIS — M47816 Spondylosis without myelopathy or radiculopathy, lumbar region: Secondary | ICD-10-CM | POA: Diagnosis not present

## 2024-01-14 MED ORDER — ROPIVACAINE HCL 2 MG/ML IJ SOLN
INTRAMUSCULAR | Status: AC
  Filled 2024-01-14: qty 20

## 2024-01-14 MED ORDER — ROPIVACAINE HCL 2 MG/ML IJ SOLN
9.0000 mL | Freq: Once | INTRAMUSCULAR | Status: AC
  Administered 2024-01-14 (×2): 9 mL via PERINEURAL

## 2024-01-14 MED ORDER — LIDOCAINE HCL 2 % IJ SOLN
20.0000 mL | Freq: Once | INTRAMUSCULAR | Status: AC
  Administered 2024-01-14 (×2): 400 mg

## 2024-01-14 MED ORDER — LIDOCAINE HCL 2 % IJ SOLN
INTRAMUSCULAR | Status: AC
Start: 2024-01-14 — End: 2024-01-14
  Filled 2024-01-14: qty 20

## 2024-01-14 NOTE — Progress Notes (Signed)
 PROVIDER NOTE: Interpretation of information contained herein should be left to medically-trained personnel. Specific patient instructions are provided elsewhere under Patient Instructions section of medical record. This document was created in part using STT-dictation technology, any transcriptional errors that may result from this process are unintentional.  Patient: Debbie Montgomery Type: Established DOB: 1945-12-17 MRN: [id number] PCP: Joshua Cathryne BROCKS, MD (Inactive)  Service: Procedure DOS: 01/14/2024 Setting: Ambulatory Location: Ambulatory outpatient facility Delivery: Face-to-face Provider: Wallie Sherry, MD Specialty: Interventional Pain Management Specialty designation: 09 Location: Outpatient facility Ref. Prov.: Sherry Wallie, MD       Interventional Therapy   Primary Reason for Admission: Surgical management of chronic pain condition.  Procedure:  Anesthesia, Analgesia, Anxiolysis:  Type: SPRINTTM Peripheral Nerve Field Stimulator (PNS) MicroLeadTM Implant Purpose: Therapeutic Region: Lumbosacral Level: L4-5 Facet Medial Branch Nerve Laterality: Left           Anesthesia: Local (1-2% Lidocaine )  Anxiolysis: None  Sedation: None  Guidance: Fluoroscopy & US  CPT (23057)    1. Lumbar facet arthropathy   2. Lumbar spondylosis    NAS-11 Pain score:   Pre-procedure: 6 /10   Post-procedure: 0-No pain/10   H&P (Pre-op Assessment):  Debbie Montgomery is a 78 y.o. (year old), female patient, seen today for interventional treatment. She  has a past surgical history that includes Laser ablation (Right); Tonsillectomy and adenoidectomy; Abdominal hysterectomy; Eye surgery (2005); Colonoscopy; Tubal ligation; bladder lift (1995); IR Radiologist Eval & Mgmt (04/02/2022); IR KYPHO LUMBAR INC FX REDUCE BONE BX UNI/BIL CANNULATION INC/IMAGING (04/09/2022); IR Radiologist Eval & Mgmt (04/19/2022); Total hip arthroplasty (Right, 08/12/2022); and Colonoscopy with propofol  (N/A,  11/29/2022).  Initial Vital Signs:  Pulse/EKG Rate: 70ECG Heart Rate: 78 Temp: (!) 97.3 F (36.3 C) Resp: 18 BP: 136/77 SpO2: 97 %  BMI: Estimated body mass index is 22.81 kg/m as calculated from the following:   Height as of this encounter: 5' 8 (1.727 m).   Weight as of this encounter: 150 lb (68 kg).  Risk Assessment: Allergies: Reviewed. She has no known allergies.  Allergy Precautions: None required Coagulopathies: Reviewed. None identified.  Blood-thinner therapy: None at this time Active Infection(s): Reviewed. None identified. Debbie Montgomery is afebrile  Site Confirmation: Debbie Montgomery was asked to confirm the procedure and laterality before marking the site, which she did. Procedure checklist: Completed Consent: Before the procedure and under the influence of no sedative(s), amnesic(s), or anxiolytics, the patient was informed of the treatment options, risks and possible complications. To fulfill our ethical and legal obligations, as recommended by the American Medical Association's Code of Ethics, I have informed the patient of my clinical impression; the nature and purpose of the treatment or procedure; the risks, benefits, and possible complications of the intervention; the alternatives, including doing nothing; the risk(s) and benefit(s) of the alternative treatment(s) or procedure(s); and the risk(s) and benefit(s) of doing nothing.  Debbie Montgomery was provided with information about the general risks and possible complications associated with most interventional procedures. These include, but are not limited to: failure to achieve desired goals, infection, bleeding, organ or nerve damage, allergic reactions, paralysis, and/or death.  In addition, she was informed of those risks and possible complications associated to this particular procedure, which include, but are not limited to: damage to the implant; failure to decrease pain; local, systemic, or serious CNS infections,  intraspinal abscess with possible cord compression and paralysis, or life-threatening such as meningitis; intrathecal and/or epidural bleeding with formation of hematoma with possible spinal cord compression and  permanent paralysis; organ damage; nerve injury or damage with subsequent sensory, motor, and/or autonomic system dysfunction, resulting in transient or permanent pain, numbness, and/or weakness of one or several areas of the body; allergic reactions, either minor or major life-threatening, such as anaphylactic or anaphylactoid reactions.  Furthermore, Debbie Montgomery was informed of those risks and complications associated with the medications. These include, but are not limited to: allergic reactions (i.e.: anaphylactic or anaphylactoid reactions); arrhythmia;  Hypotension/hypertension; cardiovascular collapse; respiratory depression and/or shortness of breath; swelling or edema; medication-induced neural toxicity; particulate matter embolism and blood vessel occlusion with resultant organ, and/or nervous system infarction and permanent paralysis.  Finally, she was informed that Medicine is not an exact science; therefore, there is also the possibility of unforeseen or unpredictable risks and/or possible complications that may result in a catastrophic outcome. The patient indicated having understood very clearly. We have given the patient no guarantees and we have made no promises. Enough time was given to the patient to ask questions, all of which were answered to the patient's satisfaction. Debbie Montgomery has indicated that she wanted to continue with the procedure. Attestation: I, the ordering provider, attest that I have discussed with the patient the benefits, risks, side-effects, alternatives, likelihood of achieving goals, and potential problems during recovery for the procedure that I have provided informed consent. Date  Time: 01/14/2024  7:47 AM  Pre-Procedure Preparation:  Monitoring: As  per clinic protocol. Respiration, ETCO2, SpO2, BP, heart rate and rhythm monitor placed and checked for adequate function Safety Precautions: Patient was assessed for positional comfort and pressure points before starting the procedure. Time-out: I initiated and conducted the Time-out before starting the procedure, as per protocol. The patient was asked to participate by confirming the accuracy of the Time Out information. Verification of the correct person, site, and procedure were performed and confirmed by me, the nursing staff, and the patient. Time-out conducted as per Joint Commission's Universal Protocol (UP.01.01.01). Time: 0830 Start Time: 0830 hrs.  Description of Procedure Process:   Position: Prone Target Area: <1 cm from targeted nerve (Medial Branch Nerve) Approach: Posterior percutaneous, paramedial, interlaminar approach Area Prepped: Entire Lumbosacral Region Prepping solution: ChloraPrep (2% chlorhexidine  gluconate and 70% isopropyl alcohol) Safety Precautions: Safe injection practices and needle disposal techniques used. Medications properly checked for expiration dates. SDV (single dose vial) medications used. Aspiration looking for blood return and/or CSF was conducted prior to all injections. At no point did I inject any substances, as a needle was being advanced. No attempts were made at seeking any paresthesias.  Description of the Procedure (Lumbar Medial Branch):  Availability of a responsible, adult driver, and NPO status confirmed. Informed consent was obtained after having discussed risks and possible complications.The patient was then taken to the fluoroscopy suite, where the patient was placed in position for the procedure, over the fluoroscopy table. The patient was then monitored in the usual manner. Fluoroscopy was manipulated to obtain the best possible view of the target. Parallex error was corrected before commencing the procedure. Once a clear view of the  target had been obtained, the skin and subcutaneous tissue over the procedure site were infiltrated using lidocaine , loaded in a 10 cc luer-loc syringe with a 0.5 inch, 25-G needle. Care was taken to avoid numbing deeper tissues The introducer needle(s) was/were then inserted through the skin and deeper tissues, specifically the multifidus muscle.  An introducer needle and stimulating probe were  assembled, inserted and advanced along the intended course of the medial branch  nerve as it traverses the lamina medial and inferior to the zygapophyseal joint, taking care to maintain the proper depth of insertion as the introducer is advanced under fluoroscopic. The introducer needle was delivered to a location in proximity to the nerve. Multiple stimulation parameters were used to deliver stimulation to the medial branch nerve in concert with stimulating at multiple positions around the nerve. Nerve target acquisition was confirmed noting generation of paresthesias in the paravertebral regions corresponding to the level being stimulated as well as rhythmic thumping within the multifidi, the latter being further corroborated via palpation.  Various electrical parameter combinations were tested, and the lead location was adjusted (physically relocated) until the patient indicated paresthesia or muscle tension overlapping the distribution of the patient's typical region of pain.  The stimulating probe was removed from the introducer and a percutaneous lead was guided through the needle and delivered to a location in similar proximity to the nerve. Final  location was verified with electrical stimulation and documented with fluoroscopy & ultrasound.  The introducer needle was removed, and the exposed end of the percutaneous lead was attached to an external stimulator unit. Various electrical parameter combinations were again  tested until the patient indicated paresthesia or muscle tension  overlapping the distribution  of the patient's typical region of pain.  After confirming that lead impedance was in the normal range, the external unit was detached, the needle was removed, and the lead was anchored at the skin. The lead was threaded into the connector block and electrical continuity and desired patient response was confirmed. The connector block was attached to the external  stimulator unit.The site was covered with a sterile occlusive dressing and  a fluoroscopic and ultrasound image was taken to document final placement. The patient was observed for stability of vital signs and comfort.  Vitals:   01/14/24 0759 01/14/24 0830 01/14/24 0835 01/14/24 0840  BP: 136/77 (!) 157/89 135/84 138/80  Pulse: 70 74    Resp: 18 15 16  (!) 80  Temp: (!) 97.3 F (36.3 C)     TempSrc: Temporal     SpO2: 97% 96% 97% 98%  Weight: 150 lb (68 kg)     Height: 5' 8 (1.727 m)      Start Time: 0830 hrs. End Time: 0838 hrs.  Imaging Guidance (Spinal):          Type of Imaging Technique: Fluoroscopy Guidance (Spinal) and ultrasound guidance to confirm multifidus activation Indication(s): Fluoroscopy guidance for needle placement to enhance accuracy in procedures requiring precise needle localization for targeted delivery of medication in or near specific anatomical locations not easily accessible without such real-time imaging assistance. Exposure Time: Please see nurses notes. Contrast: None used. Fluoroscopic Guidance: I was personally present during the use of fluoroscopy. Tunnel Vision Technique used to obtain the best possible view of the target area. Parallax error corrected before commencing the procedure. Direction-depth-direction technique used to introduce the needle under continuous pulsed fluoroscopy. Once target was reached, antero-posterior, oblique, and lateral fluoroscopic projection used confirm needle placement in all planes. Images permanently stored in EMR. Interpretation: No contrast injected. I  personally interpreted the imaging intraoperatively. Adequate needle placement confirmed in multiple planes. Permanent images saved into the patient's record.  Antibiotic Prophylaxis:   Anti-infectives (From admission, onward)    None      Indication(s): None identified   Post-operative Assessment:  Post-procedure Vital Signs:  Pulse/HCG Rate: 7470 Temp: (!) 97.3 F (36.3 C) Resp: (!) 80 BP: 138/80 SpO2: 98 %  Complications: No immediate post-treatment complications observed by team, or reported by patient.  Note: Tolerated procedure well A repeat set of vitals were taken after the procedure and the patient was kept under observation following institutional policy, for this type of procedure. Post-procedural neurological assessment was performed, showing return to baseline, prior to discharge. The patient was provided with post-procedure discharge instructions, including a section on how to identify potential problems. Should any problems arise concerning this procedure, the patient was given instructions to immediately contact us , at any time, without hesitation. In any case, we plan to contact the patient by telephone for a follow-up status report regarding this interventional procedure.  Comments:  No additional relevant information.  Plan of Care  Orders:  Orders Placed This Encounter  Procedures   DG PAIN CLINIC C-ARM 1-60 MIN NO REPORT    Intraoperative interpretation by procedural physician at [facility].    Standing Status:   Standing    Number of Occurrences:   1    Reason for exam::   Assistance in needle guidance and placement for procedures requiring needle placement in or near specific anatomical locations not easily accessible without such assistance.     Medications administered: We administered lidocaine  and ropivacaine  (PF) 2 mg/mL (0.2%).  See the medical record for exact dosing, route, and time of administration.  Follow-up plan:   Return in  about 8 weeks (around 03/10/2024) for PNS lead pull.       Right L4 PNS 10/20/23, Left L4 PNS 01/14/24    Recent Visits Date Type Provider Dept  12/24/23 Procedure visit Marcelino Nurse, MD [facility]  11/27/23 Office Visit Marcelino Nurse, MD [facility]  10/20/23 Procedure visit Marcelino Nurse, MD [facility]  Showing recent visits within past 90 days and meeting all other requirements Today's Visits Date Type Provider Dept  01/14/24 Procedure visit Marcelino Nurse, MD [facility]  Showing today's visits and meeting all other requirements Future Appointments Date Type Provider Dept  01/20/24 Appointment Marcelino Nurse, MD [facility]  Showing future appointments within next 90 days and meeting all other requirements  Disposition: Discharge home  Discharge (Date  Time): 01/14/2024;   hrs.   Primary Care Physician: Joshua Cathryne BROCKS, MD (Inactive) Location: [facility] Note by: Nurse Marcelino, MD (TTS technology used. I apologize for any typographical errors that were not detected and corrected.) Date: 01/14/2024; Time: 8:48 AM

## 2024-01-14 NOTE — Patient Instructions (Signed)

## 2024-01-14 NOTE — Progress Notes (Signed)
 Safety precautions to be maintained throughout the outpatient stay will include: orient to surroundings, keep bed in low position, maintain call bell within reach at all times, provide assistance with transfer out of bed and ambulation.

## 2024-01-15 ENCOUNTER — Telehealth: Payer: Self-pay | Admitting: *Deleted

## 2024-01-15 NOTE — Telephone Encounter (Signed)
 Post procedure call; patient reports that she is doing great.  No questions or concerns.

## 2024-01-20 ENCOUNTER — Encounter: Admitting: Student in an Organized Health Care Education/Training Program

## 2024-01-28 ENCOUNTER — Ambulatory Visit: Admitting: Student in an Organized Health Care Education/Training Program

## 2024-02-20 ENCOUNTER — Other Ambulatory Visit: Payer: Self-pay | Admitting: Student in an Organized Health Care Education/Training Program

## 2024-03-10 ENCOUNTER — Encounter: Payer: Self-pay | Admitting: Student in an Organized Health Care Education/Training Program

## 2024-03-10 ENCOUNTER — Ambulatory Visit
Attending: Student in an Organized Health Care Education/Training Program | Admitting: Student in an Organized Health Care Education/Training Program

## 2024-03-10 VITALS — BP 131/87 | HR 88 | Temp 97.2°F | Resp 16 | Ht 68.0 in | Wt 150.0 lb

## 2024-03-10 DIAGNOSIS — Z9889 Other specified postprocedural states: Secondary | ICD-10-CM | POA: Diagnosis not present

## 2024-03-10 DIAGNOSIS — M51362 Other intervertebral disc degeneration, lumbar region with discogenic back pain and lower extremity pain: Secondary | ICD-10-CM

## 2024-03-10 DIAGNOSIS — M47816 Spondylosis without myelopathy or radiculopathy, lumbar region: Secondary | ICD-10-CM

## 2024-03-10 NOTE — Progress Notes (Signed)
 Safety precautions to be maintained throughout the outpatient stay will include: orient to surroundings, keep bed in low position, maintain call bell within reach at all times, provide assistance with transfer out of bed and ambulation.

## 2024-03-10 NOTE — Progress Notes (Signed)
 9184 PNS lead pull per Dr. Marcelino. Tolerated well. Lead intact. Site clear. Wound care instructions given.

## 2024-03-10 NOTE — Progress Notes (Signed)
 PROVIDER NOTE: Interpretation of information contained herein should be left to medically-trained personnel. Specific patient instructions are provided elsewhere under Patient Instructions section of medical record. This document was created in part using AI and STT-dictation technology, any transcriptional errors that may result from this process are unintentional.  Patient: Debbie Montgomery  Service: E/M   PCP: Joshua Cathryne BROCKS, MD (Inactive)  DOB: 1945-09-10  DOS: 03/10/2024  Provider: Wallie Sherry, MD  MRN: [id number]  Delivery: Face-to-face  Specialty: Interventional Pain Management  Type: Established Patient  Setting: Ambulatory outpatient facility  Specialty designation: 09  Referring Prov.: No ref. provider found  Location: Outpatient office facility       History of present illness (HPI) Debbie Montgomery, a 78 y.o. year old female, is here today because of her Lumbar facet arthropathy [M47.816]. [name] primary complain today is Back Pain (Lumbar left side, right is better )  Pertinent problems: Debbie Montgomery does not have any pertinent problems on file.  Pain Assessment: Severity of Chronic pain is reported as a 0-No pain/10. Location: Back Left, Lower/denies. Onset: More than a month ago. Quality: Discomfort. Timing: Intermittent. Modifying factor(s): procedure on right help. Vitals:  height is 5' 8 (1.727 m) and weight is 150 lb (68 kg). Her temporal temperature is 97.2 F (36.2 C) (abnormal). Her blood pressure is 131/87 and her pulse is 88. Her respiration is 16 and oxygen saturation is 99%.  BMI: Estimated body mass index is 22.81 kg/m as calculated from the following:   Height as of this encounter: 5' 8 (1.727 m).   Weight as of this encounter: 150 lb (68 kg).  Last encounter: 11/27/2023. Last procedure: 01/14/2024.  Reason for encounter: Removal of Sprint peripheral nerve stimulator lead   ROS  Constitutional: Denies any fever or  chills Gastrointestinal: No reported hemesis, hematochezia, vomiting, or acute GI distress Musculoskeletal: Denies any acute onset joint swelling, redness, loss of ROM, or weakness Neurological: No reported episodes of acute onset apraxia, aphasia, dysarthria, agnosia, amnesia, paralysis, loss of coordination, or loss of consciousness  Medication Review  Aspirin-Acetaminophen -Caffeine, FLUoxetine , Fiber, OVER THE COUNTER MEDICATION, acetaminophen , atorvastatin , denosumab, diphenhydramine-acetaminophen , gabapentin , and hydrochlorothiazide   History Review  Allergy: [name] has no known allergies. Drug: [name]  reports no history of drug use. Alcohol:  reports current alcohol use of about 1.0 standard drink of alcohol per week. Tobacco:  reports that she has quit smoking. She has never used smokeless tobacco. Social: Ms. Buskirk  reports that she has quit smoking. She has never used smokeless tobacco. She reports current alcohol use of about 1.0 standard drink of alcohol per week. She reports that she does not use drugs. Medical:  has a past medical history of Arthritis, DDD (degenerative disc disease), lumbar, HLD (hyperlipidemia), Hypertension, and New onset left bundle branch block (LBBB) (07/31/2022). Surgical: Debbie Montgomery  has a past surgical history that includes Laser ablation (Right); Tonsillectomy and adenoidectomy; Abdominal hysterectomy; Eye surgery (2005); Colonoscopy; Tubal ligation; bladder lift (1995); IR Radiologist Eval & Mgmt (04/02/2022); IR KYPHO LUMBAR INC FX REDUCE BONE BX UNI/BIL CANNULATION INC/IMAGING (04/09/2022); IR Radiologist Eval & Mgmt (04/19/2022); Total hip arthroplasty (Right, 08/12/2022); and Colonoscopy with propofol  (N/A, 11/29/2022). Family: family history includes Heart attack in her maternal grandmother; Heart disease in her father; Hypertension in her mother; Stroke in her maternal grandmother.  Laboratory Chemistry Profile   Renal Lab Results   Component Value Date   BUN 25 04/21/2023   CREATININE 0.65 04/21/2023   BCR  38 (H) 04/21/2023   GFRAA 103 12/17/2019   GFRNONAA >60 08/13/2022    Hepatic Lab Results  Component Value Date   AST 17 07/31/2022   ALT 12 07/31/2022   ALBUMIN 4.6 04/21/2023   ALKPHOS 28 (L) 07/31/2022    Electrolytes Lab Results  Component Value Date   NA 142 04/21/2023   K 4.3 04/21/2023   CL 100 04/21/2023   CALCIUM  10.1 04/21/2023   PHOS 4.3 04/21/2023    Bone No results found for: VD25OH, VD125OH2TOT, CI6874NY7, CI7874NY7, 25OHVITD1, 25OHVITD2, 25OHVITD3, TESTOFREE, TESTOSTERONE  Inflammation (CRP: Acute Phase) (ESR: Chronic Phase) No results found for: CRP, ESRSEDRATE, LATICACIDVEN       Note: Above Lab results reviewed.   Physical Exam  Vitals: BP 131/87 (BP Location: Right Arm, Patient Position: Sitting, Cuff Size: Normal)   Pulse 88   Temp (!) 97.2 F (36.2 C) (Temporal)   Resp 16   Ht 5' 8 (1.727 m)   Wt 150 lb (68 kg)   SpO2 99%   BMI 22.81 kg/m  BMI: Estimated body mass index is 22.81 kg/m as calculated from the following:   Height as of this encounter: 5' 8 (1.727 m).   Weight as of this encounter: 150 lb (68 kg). Ideal: Ideal body weight: 63.9 kg (140 lb 14 oz) Adjusted ideal body weight: 65.6 kg (144 lb 8.4 oz) General appearance: Well nourished, well developed, and well hydrated. In no apparent acute distress Mental status: Alert, oriented x 3 (person, place, & time)       Respiratory: No evidence of acute respiratory distress Eyes: PERLA  Peripheral nerve stimulator lead removed, tip intact Assessment   Diagnosis Status  1. Lumbar facet arthropathy   2. Lumbar spondylosis   3. Degeneration of intervertebral disc of lumbar region with discogenic back pain and lower extremity pain   4. History of kyphoplasty- L1    Controlled Controlled Controlled   Updated Problems: No problems updated.  Plan of Care  The patient presents for  removal of the left Sprint peripheral nerve stimulator lead following completion of a 60-day treatment targeting the left L4 medial branch.  She reports significant improvement in axial low back pain and notes increased comfort with ambulation since initiation of therapy. She is optimistic about assessing her new baseline pain level over the next several months following device removal.  Should pain recur or plateau, spinal cord stimulation (SCS) remains a potential future option. However, given her history of L1 kyphoplasty, percutaneous epidural access may be technically challenging. If SCS is pursued, she would likely require a surgical trial and evaluation by neurosurgery, which can be discussed further as clinically indicated.   Orders:  No orders of the defined types were placed in this encounter.    Right L4 PNS 10/20/23, Left L4 PNS 01/14/24    Return if symptoms worsen or fail to improve.    Recent Visits Date Type Provider Dept  01/14/24 Procedure visit Marcelino Nurse, MD [facility]  12/24/23 Procedure visit Marcelino Nurse, MD [facility]  Showing recent visits within past 90 days and meeting all other requirements Today's Visits Date Type Provider Dept  03/10/24 Procedure visit Marcelino Nurse, MD [facility]  Showing today's visits and meeting all other requirements Future Appointments No visits were found meeting these conditions. Showing future appointments within next 90 days and meeting all other requirements  I discussed the assessment and treatment plan with the patient. The patient was provided an opportunity to  ask questions and all were answered. The patient agreed with the plan and demonstrated an understanding of the instructions.  Patient advised to call back or seek an in-person evaluation if the symptoms or condition worsens.  I personally spent a total of 20 minutes in the care of the patient today including preparing to see the  patient, getting/reviewing separately obtained history, performing a medically appropriate exam/evaluation, counseling and educating, placing orders, and documenting clinical information in the EHR.   Note by: Wallie Sherry, MD (TTS and AI technology used. I apologize for any typographical errors that were not detected and corrected.) Date: 03/10/2024; Time: 8:26 AM

## 2024-04-05 ENCOUNTER — Telehealth: Payer: Self-pay | Admitting: Family Medicine

## 2024-04-05 NOTE — Telephone Encounter (Signed)
 Tried to reach pt. Left vm. Rescheduled pt in an transfer of care slot. If time and date doesn't work, please schedule pt in slot that is convenient for her.

## 2024-04-06 ENCOUNTER — Ambulatory Visit
Attending: Student in an Organized Health Care Education/Training Program | Admitting: Student in an Organized Health Care Education/Training Program

## 2024-04-06 ENCOUNTER — Encounter: Payer: Self-pay | Admitting: Student in an Organized Health Care Education/Training Program

## 2024-04-06 VITALS — BP 125/81 | Temp 97.9°F | Resp 16 | Ht 68.0 in | Wt 150.0 lb

## 2024-04-06 DIAGNOSIS — G894 Chronic pain syndrome: Secondary | ICD-10-CM | POA: Diagnosis present

## 2024-04-06 DIAGNOSIS — Z9889 Other specified postprocedural states: Secondary | ICD-10-CM | POA: Diagnosis present

## 2024-04-06 DIAGNOSIS — M51362 Other intervertebral disc degeneration, lumbar region with discogenic back pain and lower extremity pain: Secondary | ICD-10-CM | POA: Diagnosis not present

## 2024-04-06 DIAGNOSIS — M47816 Spondylosis without myelopathy or radiculopathy, lumbar region: Secondary | ICD-10-CM | POA: Insufficient documentation

## 2024-04-06 MED ORDER — HYDROCODONE-ACETAMINOPHEN 5-325 MG PO TABS: 1.0000 | ORAL_TABLET | Freq: Every day | ORAL | 0 refills | Status: AC | PRN

## 2024-04-06 NOTE — Patient Instructions (Addendum)
 Controlled substance agreement UDS   ______________________________________________________________________    Medication Rules  Purpose: To inform patients, and their family members, of our medication rules and regulations.  Applies to: All patients receiving prescriptions from our practice (written or electronic).  Pharmacy of record: This is the pharmacy where your electronic prescriptions will be sent. Make sure we have the correct one.  Electronic prescriptions: In compliance with the [place]  Strengthen Opioid Misuse Prevention (STOP) Act of 2017 (Session Law 2017-74/H243), effective June 03, 2018, all controlled substances must be electronically prescribed. Written prescriptions, faxing, or calling prescriptions to a pharmacy will no longer be done.  Prescription refills: These will be provided only during in-person appointments. No medications will be renewed without a face-to-face evaluation with your provider. Applies to all prescriptions.  NOTE: The following applies primarily to controlled substances (Opioid* Pain Medications).   Type of encounter (visit): For patients receiving controlled substances, face-to-face visits are required. (Not an option and not up to the patient.)  Patient's Responsibilities: Pain Pills: Bring all pain pills to every appointment (except for procedure appointments). Pill counts are required.  Pill Bottles: Bring pills in original pharmacy bottle. Bring bottle, even if empty. Always bring the bottle of the most recent fill.  Medication refills: You are responsible for knowing and keeping track of what medications you are taking and when is it that you will need a refill. The day before your appointment: write a list of all prescriptions that need to be refilled. The day of the appointment: give the list to the admitting nurse. Prescriptions will be written only during appointments. No prescriptions will be written on procedure days. If you  forget a medication: it will not be Called in, Faxed, or electronically sent. You will need to get another appointment to get these prescribed. No early refills. Do not call asking to have your prescription filled early. Partial  or short prescriptions: Occasionally your pharmacy may not have enough pills to fill your prescription.  NEVER ACCEPT a partial fill or a prescription that is short of the total amount of pills that you were prescribed.  With controlled substances the law allows 72 hours for the pharmacy to complete the prescription.  If the prescription is not completed within 72 hours, the pharmacist will require a new prescription to be written. This means that you will be short on your medicine and we WILL NOT send another prescription to complete your original prescription.  Instead, request the pharmacy to send a carrier to a nearby branch to get enough medication to provide you with your full prescription. Prescription Accuracy: You are responsible for carefully inspecting your prescriptions before leaving our office. Have the discharge nurse carefully go over each prescription with you, before taking them home. Make sure that your name is accurately spelled, that your address is correct. Check the name and dose of your medication to make sure it is accurate. Check the number of pills, and the written instructions to make sure they are clear and accurate. Make sure that you are given enough medication to last until your next medication refill appointment. Taking Medication: Take medication as prescribed. When it comes to controlled substances, taking less pills or less frequently than prescribed is permitted and encouraged. Never take more pills than instructed. Never take the medication more frequently than prescribed.  Inform other Doctors: Always inform, all of your healthcare providers, of all the medications you take. Pain Medication from other Providers: You are not allowed to  accept  any additional pain medication from any other Doctor or Healthcare provider. There are two exceptions to this rule. (see below) In the event that you require additional pain medication, you are responsible for notifying us , as stated below. Cough Medicine: Often these contain an opioid, such as codeine or hydrocodone . Never accept or take cough medicine containing these opioids if you are already taking an opioid* medication. The combination may cause respiratory failure and death. Medication Agreement: You are responsible for carefully reading and following our Medication Agreement. This must be signed before receiving any prescriptions from our practice. Safely store a copy of your signed Agreement. Violations to the Agreement will result in no further prescriptions. (Additional copies of our Medication Agreement are available upon request.) Laws, Rules, & Regulations: All patients are expected to follow all [address] and [employer], Rules, [place]. Ignorance of the Laws does not constitute a valid excuse.  Illegal drugs and Controlled Substances: The use of illegal substances (including, but not limited to marijuana and its derivatives) and/or the illegal use of any controlled substances is strictly prohibited. Violation of this rule may result in the immediate and permanent discontinuation of any and all prescriptions being written by our practice. The use of any illegal substances is prohibited. Adopted CDC guidelines & recommendations: Target dosing levels will be at or below 60 MME/day. Use of benzodiazepines** is not recommended. Urine Drug testing: Patients taking controlled substances will be required to provide a urine sample upon request. Do not void before coming to your medication management appointments. Hold emptying your bladder until you are admitted. The admitting nurse will inform you if a sample is required. Our practice reserves the right to call you at any time to  provide a sample. Once receiving the call, you have 24 hours to comply with request. Not providing a sample upon request may result in termination of medication therapy.  Exceptions: There are only two exceptions to the rule of not receiving pain medications from other Healthcare Providers. Exception #1 (Emergencies): In the event of an emergency (i.e.: accident requiring emergency care), you are allowed to receive additional pain medication. However, you are responsible for: As soon as you are able, call our office [phone], at any time of the day or night, and leave a message stating your name, the date and nature of the emergency, and the name and dose of the medication prescribed. In the event that your call is answered by a member of our staff, make sure to document and save the date, time, and the name of the person that took your information.  Exception #2 (Planned Surgery): In the event that you are scheduled by another doctor or dentist to have any type of surgery or procedure, you are allowed (for a period no longer than 30 days), to receive additional pain medication, for the acute post-op pain. However, in this case, you are responsible for picking up a copy of our Post-op Pain Management for Surgeons handout, and giving it to your surgeon or dentist. This document is available at our office, and does not require an appointment to obtain it. Simply go to our office during business hours (Monday-Thursday from 8:00 AM to 4:00 PM) (Friday 8:00 AM to 12:00 Noon) or if you have a scheduled appointment with us , prior to your surgery, and ask for it by name. In addition, you are responsible for: calling our office [phone], at any time of the day or night, and leaving a message stating  your name, name of your surgeon, type of surgery, and date of procedure or surgery. Failure to comply with your responsibilities may result in termination of therapy involving the controlled  substances.  Consequences:  Non-compliance with the above rules may result in permanent discontinuation of medication prescription therapy. All patients receiving any type of controlled substance is expected to comply with the above patient responsibilities. Not doing so may result in permanent discontinuation of medication prescription therapy. Medication Agreement Violation. Following the above rules, including your responsibilities will help you in avoiding a Medication Agreement Violation ("Breaking your Pain Medication Contract").  *Opioid medications include: morphine , codeine, oxycodone , oxymorphone, hydrocodone , hydromorphone, meperidine, tramadol , tapentadol, buprenorphine, fentanyl , methadone. **Benzodiazepine medications include: diazepam (Valium), alprazolam (Xanax), clonazepam (Klonopine), lorazepam (Ativan), clorazepate (Tranxene), chlordiazepoxide (Librium), estazolam (Prosom), oxazepam (Serax), temazepam (Restoril), triazolam (Halcion) (Last updated: 03/26/2023) ______________________________________________________________________

## 2024-04-06 NOTE — Progress Notes (Signed)
 Safety precautions to be maintained throughout the outpatient stay will include: orient to surroundings, keep bed in low position, maintain call bell within reach at all times, provide assistance with transfer out of bed and ambulation.

## 2024-04-06 NOTE — Progress Notes (Signed)
 PROVIDER NOTE: Interpretation of information contained herein should be left to medically-trained personnel. Specific patient instructions are provided elsewhere under Patient Instructions section of medical record. This document was created in part using AI and STT-dictation technology, any transcriptional errors that may result from this process are unintentional.  Patient: Debbie Montgomery  Service: E/M   PCP: Debbie Cathryne BROCKS, MD (Inactive)  DOB: 1945-12-04  DOS: 04/06/2024  Provider: Wallie Sherry, MD  MRN: [id number]  Delivery: Face-to-face  Specialty: Interventional Pain Management  Type: Established Patient  Setting: Ambulatory outpatient facility  Specialty designation: 09  Referring Prov.: No ref. provider found  Location: Outpatient office facility       History of present illness (HPI) Ms. Debbie Montgomery, a 78 y.o. year old female, is here today because of her Lumbar facet arthropathy [M47.816]. Debbie Montgomery primary complain today is Back Pain (Lumbar bilateral )  Pain Assessment: Severity of Chronic pain is reported as a 5 /10. Location: Back Lower, Left, Right/radiates forward towards abdomen. Onset: More than a month ago. Quality: Discomfort. Timing:  . Modifying factor(s): elevating legs and heating pad, OTC meds. Vitals:  height is 5' 8 (1.727 m) and weight is 150 lb (68 kg). Her temporal temperature is 97.9 F (36.6 C). Her blood pressure is 125/81. Her respiration is 16 and oxygen saturation is 97%.  BMI: Estimated body mass index is 22.81 kg/m as calculated from the following:   Height as of this encounter: 5' 8 (1.727 m).   Weight as of this encounter: 150 lb (68 kg).  Last encounter: 11/27/2023. Last procedure: 03/10/2024.  Reason for encounter: follow-up evaluation.   Discussed the use of AI scribe software for clinical note transcription with the patient, who gave verbal consent to proceed.  History of Present Illness   Debbie Montgomery is a 78  year old female with severe degenerative disc disease who presents with worsening lumbar pain.  She experiences severe lumbar pain that worsens throughout the day, causing her to slump due to discomfort. The pain is somewhat alleviated by sitting and using a heating pad nightly. She faces significant limitations in walking, unable to walk more than five to ten minutes before the pain intensifies.  She has a history of lumbar L4 medial branch peripheral nerve stimulation on both sides, which initially provided relief. The right side treatment resolved nerve pain radiating down her right leg to her foot, and the left side treatment reduced lumbar pain while the stimulator was in place. However, once the stimulators were removed, the lumbar pain returned and worsened over time.  She has tried various treatments including lidocaine  patches, extra strength Excedrin, and hydrocodone , which she uses sparingly. Gabapentin  was ineffective. She also uses a heating pad and posture support to manage her symptoms.  She has not undergone spine surgery and has previously had lumbar radiofrequency ablations with diminishing effectiveness. Her past medical history includes hip surgery, for which she was prescribed hydrocodone , and she continues to use this medication cautiously. She wants to maintain her activity level and manage her weight despite her pain.  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       Pharmacotherapy Assessment   Low-dose hydrocodone , 5 mg twice daily as needed Monitoring: [place] PMP: PDMP reviewed during this encounter.       Pharmacotherapy: No side-effects or adverse reactions reported. Compliance: No problems identified. Effectiveness: Clinically acceptable.  Debbie Chrissie MATSU, RN  04/06/2024  2:25 PM  Sign when Signing Visit Safety precautions to be maintained  throughout the outpatient stay will include: orient to surroundings, keep bed in low position, maintain call bell within reach at all times, provide assistance with transfer out of bed and ambulation.   UDS:  No results found for: SUMMARY  No results found for: CBDTHCR No results found for: D8THCCBX No results found for: D9THCCBX  ROS  Constitutional: Denies any fever or chills Gastrointestinal: No reported hemesis, hematochezia, vomiting, or acute GI distress Musculoskeletal: + low back pain Neurological: No reported episodes of acute onset apraxia, aphasia, dysarthria, agnosia, amnesia, paralysis, loss of coordination, or loss of consciousness  Medication Review  Aspirin-Acetaminophen -Caffeine, FLUoxetine , Fiber, HYDROcodone -acetaminophen , OVER THE COUNTER MEDICATION, acetaminophen , atorvastatin , denosumab, diphenhydramine-acetaminophen , estradiol, and hydrochlorothiazide   History Review  Allergy: Debbie Montgomery has no known allergies. Drug: Debbie Montgomery  reports no history of drug use. Alcohol:  reports current alcohol use of about 1.0 standard drink of alcohol per week. Tobacco:  reports that she has quit smoking. She has never used smokeless tobacco. Social: Debbie Montgomery  reports that she has quit smoking. She has never used smokeless tobacco. She reports current alcohol use of about 1.0 standard drink of alcohol per week. She reports that she does not use drugs. Medical:  has a past medical history of Arthritis, DDD (degenerative disc disease), lumbar, HLD (hyperlipidemia), Hypertension, and New onset left bundle branch block (LBBB) (07/31/2022). 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 Family: family history includes Heart attack in her maternal grandmother; Heart disease in her father; Hypertension in her mother; Stroke in her maternal grandmother.  Laboratory Chemistry Profile   Renal Lab Results  Component Value Date   BUN 25 04/21/2023   CREATININE 0.65 04/21/2023   BCR 38 (H) 04/21/2023   GFRAA 103 12/17/2019   GFRNONAA >60 08/13/2022    Hepatic Lab Results  Component Value Date   AST 17 07/31/2022   ALT 12 07/31/2022   ALBUMIN 4.6 04/21/2023   ALKPHOS 28 (L) 07/31/2022    Electrolytes Lab Results  Component Value Date   NA 142 04/21/2023   K 4.3 04/21/2023   CL 100 04/21/2023   CALCIUM  10.1 04/21/2023   PHOS 4.3 04/21/2023    Bone No results found for: VD25OH, VD125OH2TOT, CI6874NY7, CI7874NY7, 25OHVITD1, 25OHVITD2, 25OHVITD3, TESTOFREE, TESTOSTERONE  Inflammation (CRP: Acute Phase) (ESR: Chronic Phase) No results found for: CRP, ESRSEDRATE, LATICACIDVEN       Note: Above Lab results reviewed.  Recent Imaging Review  DG PAIN CLINIC C-ARM 1-60 MIN NO REPORT Fluoro was used, but no Radiologist interpretation will be provided.  Please refer to NOTES tab for provider progress note. Note: Reviewed        Physical Exam  Vitals: BP 125/81 (BP Location: Right Arm, Patient Position: Sitting, Cuff Size: Normal)   Temp 97.9 F (36.6 C) (Temporal)   Resp 16   Ht 5' 8 (1.727 m)   Wt 150 lb (68 kg)   SpO2 97%   BMI 22.81 kg/m  BMI: Estimated body mass index is 22.81 kg/m as calculated from the following:   Height as  of this encounter: 5' 8 (1.727 m).   Weight as of this encounter: 150 lb (68 kg). Ideal: Ideal body weight: 63.9 kg (140 lb 14 oz) Adjusted ideal body weight: 65.6 kg (144 lb 8.4 oz) General appearance: Well nourished, well developed, and well hydrated. In no  apparent acute distress Mental status: Alert, oriented x 3 (person, place, & time)       Respiratory: No evidence of acute respiratory distress Eyes: PERLA  + LOW BACK PAIN  Assessment   Diagnosis  1. Lumbar facet arthropathy   2. Lumbar spondylosis   3. Degeneration of intervertebral disc of lumbar region with discogenic back pain and lower extremity pain   4. History of kyphoplasty- L1   5. Chronic pain syndrome      Updated Problems: No problems updated.  Plan of Care  Problem-specific:  Assessment and Plan    Lumbar degenerative disc disease with chronic pain syndrome   Chronic lumbar pain results from severe degenerative disc disease with significant disc desiccation and contraction, increasing pain during ambulation. A previous lumbar medial branch peripheral nerve stimulator provided temporary relief, but pain returned after removal. Ablations have become less effective, and gabapentin  was ineffective. Current management includes extra strength Excedrin and hydrocodone  as needed. A repeat peripheral nerve stimulator trial is under consideration to potentially avoid surgical implantation. A lead conversion kit was discussed to reduce physical burden. Jornavax, a new non-opioid analgesic, is provided for acute pain management. Prescribed hydrocodone  60 tablets, 1-2 tablets daily as needed for pain. Provided a sample of Jornavax for acute pain management, to be taken as per insert instructions. Scheduled a follow-up appointment in early January to assess pain management and discuss the potential repeat peripheral nerve stimulator trial. Performed a baseline urine screen and had her sign a controlled substance agreement.       Ms. Tasfia Vasseur Sickinger has a current medication list which includes the following long-term medication(s): atorvastatin , diphenhydramine-acetaminophen , fluoxetine , and hydrochlorothiazide .  Pharmacotherapy (Medications Ordered): Meds ordered this encounter   Medications   HYDROcodone -acetaminophen  (NORCO/VICODIN) 5-325 MG tablet    Sig: Take 1-2 tablets by mouth daily as needed for severe pain (pain score 7-10). Must last 30 days    Dispense:  60 tablet    Refill:  0    Chronic Pain: STOP Act (Not applicable) Fill 1 day early if closed on refill date. Avoid benzodiazepines within 8 hours of opioids   Orders:  Orders Placed This Encounter  Procedures   Compliance Drug Analysis, Ur    Volume: 30 ml(s). Minimum 3 ml of urine is needed. Document temperature of fresh sample. Indications: Long term (current) use of opiate analgesic (S20.108) Test#: [phone] (Comprehensive Profile)    Release to patient:   Immediate     Right L4 PNS 10/20/23, Left L4 PNS 01/14/24    Return in about 2 months (around 06/07/2024) for Follow up  MM and discuss repeat PNS.    Recent Visits Date Type Provider Dept  03/10/24 Procedure visit Marcelino Nurse, MD [facility]  01/14/24 Procedure visit Marcelino Nurse, MD [facility]  Showing recent visits within past 90 days and meeting all other requirements Today's Visits Date Type Provider Dept  04/06/24 Office Visit Marcelino Nurse, MD [facility]  Showing today's visits and meeting all other requirements Future Appointments Date Type Provider Dept  06/08/24 Appointment Marcelino Nurse, MD [facility]  Showing future appointments within next 90 days and meeting all other requirements  I discussed  the assessment and treatment plan with the patient. The patient was provided an opportunity to ask questions and all were answered. The patient agreed with the plan and demonstrated an understanding of the instructions.  Patient advised to call back or seek an in-person evaluation if the symptoms or condition worsens.  I personally spent a total of 30 minutes in the care of the patient today including preparing to see the patient, getting/reviewing separately obtained history, performing a  medically appropriate exam/evaluation, counseling and educating, placing orders, and documenting clinical information in the EHR.   Note by: Debbie Sherry, MD (TTS and AI technology used. I apologize for any typographical errors that were not detected and corrected.) Date: 04/06/2024; Time: 3:11 PM

## 2024-04-07 ENCOUNTER — Encounter: Admitting: Student

## 2024-04-09 LAB — COMPLIANCE DRUG ANALYSIS, UR

## 2024-04-13 ENCOUNTER — Other Ambulatory Visit: Payer: Self-pay

## 2024-04-13 DIAGNOSIS — I10 Essential (primary) hypertension: Secondary | ICD-10-CM

## 2024-04-13 MED ORDER — HYDROCHLOROTHIAZIDE 12.5 MG PO TABS
12.5000 mg | ORAL_TABLET | Freq: Every day | ORAL | 1 refills | Status: AC
Start: 2024-04-13 — End: ?

## 2024-04-14 ENCOUNTER — Telehealth: Payer: Self-pay

## 2024-04-14 NOTE — Telephone Encounter (Signed)
 ATTN: Dr LEMON

## 2024-04-14 NOTE — Telephone Encounter (Signed)
 Copied from CRM [id number]. Topic: General - Other >> Apr 14, 2024  2:38 PM Santiya F wrote: Reason for CRM: [facility] is calling in because patient needs a surgical clearance, and they wanted to know since patient has a TOC appointment with Dr. Lemon, would she be able to do the clearance for patient on her next appointment.

## 2024-04-15 NOTE — Telephone Encounter (Signed)
 Forms received from [facility], forms will be reviewed at patient transfer of care appointment on 05/17/2024.

## 2024-04-15 NOTE — Telephone Encounter (Signed)
 No callback phone number provided. Can not return call to [facility]

## 2024-04-16 ENCOUNTER — Telehealth: Payer: Self-pay | Admitting: Nurse Practitioner

## 2024-04-16 NOTE — Telephone Encounter (Signed)
 Called pharm  PA was sent 11/6 for medication, They were able to re run the request, medication went through, patient able to pick up. Call Debbie Montgomery and informed her that medication was ready top be picked up.

## 2024-04-16 NOTE — Telephone Encounter (Signed)
 Still hasn't been able to pick up her Hydrocodone  ? Please check with pharmacy for her

## 2024-05-14 ENCOUNTER — Encounter: Payer: Self-pay | Admitting: Student

## 2024-05-14 ENCOUNTER — Ambulatory Visit: Admitting: Student

## 2024-05-14 VITALS — BP 110/72 | HR 65 | Temp 98.2°F | Ht 68.0 in | Wt 158.0 lb

## 2024-05-14 DIAGNOSIS — F419 Anxiety disorder, unspecified: Secondary | ICD-10-CM

## 2024-05-14 DIAGNOSIS — R739 Hyperglycemia, unspecified: Secondary | ICD-10-CM

## 2024-05-14 DIAGNOSIS — E782 Mixed hyperlipidemia: Secondary | ICD-10-CM

## 2024-05-14 DIAGNOSIS — M810 Age-related osteoporosis without current pathological fracture: Secondary | ICD-10-CM | POA: Insufficient documentation

## 2024-05-14 DIAGNOSIS — Z1231 Encounter for screening mammogram for malignant neoplasm of breast: Secondary | ICD-10-CM | POA: Diagnosis not present

## 2024-05-14 DIAGNOSIS — F329 Major depressive disorder, single episode, unspecified: Secondary | ICD-10-CM | POA: Insufficient documentation

## 2024-05-14 DIAGNOSIS — F325 Major depressive disorder, single episode, in full remission: Secondary | ICD-10-CM

## 2024-05-14 DIAGNOSIS — E7800 Pure hypercholesterolemia, unspecified: Secondary | ICD-10-CM | POA: Diagnosis not present

## 2024-05-14 DIAGNOSIS — I10 Essential (primary) hypertension: Secondary | ICD-10-CM | POA: Diagnosis not present

## 2024-05-14 DIAGNOSIS — E785 Hyperlipidemia, unspecified: Secondary | ICD-10-CM | POA: Insufficient documentation

## 2024-05-14 DIAGNOSIS — R202 Paresthesia of skin: Secondary | ICD-10-CM

## 2024-05-14 DIAGNOSIS — G629 Polyneuropathy, unspecified: Secondary | ICD-10-CM

## 2024-05-14 DIAGNOSIS — R200 Anesthesia of skin: Secondary | ICD-10-CM | POA: Diagnosis not present

## 2024-05-14 MED ORDER — ATORVASTATIN CALCIUM 10 MG PO TABS: 10.0000 mg | ORAL_TABLET | Freq: Every day | ORAL | 1 refills | Status: AC

## 2024-05-14 MED ORDER — FLUOXETINE HCL 20 MG PO TABS: 20.0000 mg | ORAL_TABLET | Freq: Every day | ORAL | 1 refills | Status: AC

## 2024-05-14 NOTE — Progress Notes (Unsigned)
 Established Patient Office Visit  Subjective   Patient ID: Debbie Montgomery, female    DOB: Jun 27, 1945  Age: 78 y.o. MRN: [id number]  Chief Complaint  Patient presents with   Transitions Of Care    Previous patient of Dr. Joshua and need medication refills. Per patient she would also like to discuss sleeping issues.     Debbie Montgomery is a 78 y.o. person with medical hx listed below who presents today for transfer of care today. Reports difficulty with sleep in the last several months.  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   [employer] FIBER GUMMIES PO Take by mouth.   diphenhydramine-acetaminophen  (TYLENOL  PM) 25-500 MG TABS tablet Take 1 tablet by mouth at bedtime as needed.   estradiol (ESTRACE) 0.01 % CREA vaginal cream Place 1 Applicatorful vaginally 2 (two) times a week.   hydrochlorothiazide  (HYDRODIURIL ) 12.5 MG tablet Take 1 tablet (12.5 mg total) by mouth daily.   HYDROcodone -acetaminophen  (NORCO/VICODIN) 5-325 MG tablet Take 1-2 tablets by mouth daily as needed.   OVER THE COUNTER MEDICATION Garcinia Cambogia Extract 1600 mg  Calcium  222 mg   PROLIA 60 MG/ML  SOSY injection Inject into the skin.   [DISCONTINUED] atorvastatin  (LIPITOR) 10 MG tablet Take 1 tablet (10 mg total) by mouth daily.   [DISCONTINUED] FLUoxetine  (PROZAC ) 20 MG tablet Take 1 tablet (20 mg total) by mouth daily.   atorvastatin  (LIPITOR) 10 MG tablet Take 1 tablet (10 mg total) by mouth daily.   FLUoxetine  (PROZAC ) 20 MG tablet Take 1 tablet (20 mg total) by mouth daily.   [DISCONTINUED] acetaminophen  (TYLENOL ) 500 MG tablet Take 2 tablets (1,000 mg total) by mouth every 8 (eight) hours. (Patient not taking: Reported on 05/14/2024)   No facility-administered encounter medications on file as of 05/14/2024.    BP 110/72   Pulse 65   Temp 98.2 F (36.8 C) (Oral)   Ht 5' 8 (1.727 m)   Wt 158 lb (71.7 kg)   SpO2 95%   BMI 24.02 kg/m  BP Readings from Last 3 Encounters:  05/14/24 110/72  04/06/24 125/81  03/10/24 131/87    Physical Exam Constitutional:      Appearance: Normal appearance.  HENT:     Mouth/Throat:     Mouth: Mucous membranes are moist.     Pharynx: Oropharynx is clear.  Cardiovascular:     Rate and Rhythm: Normal rate and regular rhythm.  Pulmonary:     Effort: Pulmonary effort is normal.     Breath sounds: No rhonchi or rales.  Abdominal:     General: Abdomen is flat. Bowel sounds are normal. There is no distension.     Palpations: Abdomen is  soft.     Tenderness: There is no abdominal tenderness.  Musculoskeletal:        General: Normal range of motion.     Right lower leg: No edema.     Left lower leg: No edema.  Skin:    General: Skin is warm and dry.     Capillary Refill: Capillary refill takes less than 2 seconds.  Neurological:     General: No focal deficit present.     Mental Status: She is alert and oriented to person, place, and time.  Psychiatric:        Mood and Affect: Mood normal.        Behavior: Behavior normal.        05/14/2024    8:54 AM 03/10/2024    7:59 AM 01/14/2024    8:09 AM  Depression screen PHQ 2/9   Decreased Interest 0 0 0  Down, Depressed, Hopeless 0 0 0  PHQ - 2 Score 0 0 0  Altered sleeping 3    Tired, decreased energy 2    Change in appetite 0    Feeling bad or failure about yourself  0    Trouble concentrating 0    Moving slowly or fidgety/restless 0    Suicidal thoughts 0    PHQ-9 Score 5    Difficult doing work/chores Not difficult at all         05/14/2024    8:55 AM 09/18/2023   11:21 AM 10/17/2022   10:10 AM 07/09/2022    3:00 PM  GAD 7 : Generalized Anxiety Score  Nervous, Anxious, on Edge 0 0 0 0  Control/stop worrying 0 0 0 0  Worry too much - different things 0 0 0 0  Trouble relaxing 0 0 0 0  Restless 0 0 0 0  Easily annoyed or irritable 0 0 0 0  Afraid - awful might happen 0 0 0 0  Total GAD 7 Score 0 0 0 0  Anxiety Difficulty Not difficult at all  Not difficult at all Not difficult at all    Results for orders placed or performed in visit on 05/14/24  Vitamin D (25 hydroxy)  Result Value Ref Range   Vit D, 25-Hydroxy 29.5 (L) 30.0 - 100.0 ng/mL  Parathyroid hormone, intact (no Ca)  Result Value Ref Range   PTH 35 15 - 65 pg/mL  Comprehensive Metabolic Panel (CMET)  Result Value Ref Range   Glucose 92 70 - 99 mg/dL   BUN 18 8 - 27 mg/dL   Creatinine, Ser 9.43 (L) 0.57 - 1.00 mg/dL   eGFR 95 >40 fO/fpw/8.26   BUN/Creatinine Ratio 32 (H) 12 - 28   Sodium 139 134 - 144 mmol/L   Potassium 4.2 3.5 - 5.2 mmol/L   Chloride 102 96 - 106 mmol/L   CO2 25 20 - 29 mmol/L   Calcium  9.6 8.7 - 10.3 mg/dL   Total Protein 6.3 6.0 - 8.5 g/dL   Albumin 4.3 3.8 - 4.8 g/dL   Globulin, Total 2.0 1.5 - 4.5 g/dL   Bilirubin Total 0.4 0.0 - 1.2 mg/dL   Alkaline Phosphatase 32 (L) 49 - 135 IU/L   AST 18 0 - 40 IU/L   ALT 13 0 - 32 IU/L  Lipid panel  Result Value Ref Range   Cholesterol, Total 142 100 - 199 mg/dL   Triglycerides 58 0 - 149 mg/dL   HDL 69 >60 mg/dL   VLDL Cholesterol Cal 12 5 -  40 mg/dL   LDL Chol Calc (NIH) 61 0 - 99 mg/dL   Chol/HDL Ratio 2.1  0.0 - 4.4 ratio  TSH  Result Value Ref Range   TSH 1.310 0.450 - 4.500 uIU/mL  HgB A1c  Result Value Ref Range   Hgb A1c MFr Bld 5.2 4.8 - 5.6 %   Est. average glucose Bld gHb Est-mCnc 103 mg/dL      The 89-bzjm ASCVD risk score (Arnett DK, et al., 2019) is: 17.6%    Assessment & Plan:  Primary hypertension Assessment & Plan: Currently on hydrochlorothiazide  12.5 mg daily. Normotensive today. Continue current medication. Labs today.    Age-related osteoporosis without current pathological fracture Assessment & Plan: Seeing Cassandra Buel Cohn PA (endo) at [facility]. Is taking vitamin D cannot remember dose and is out of calcium . Encouraged her to restart and engaging in weight bearing exercise.   Orders: -     VITAMIN D 25 Hydroxy (Vit-D Deficiency, Fractures) -     Parathyroid hormone, intact (no Ca) -     Comprehensive metabolic panel with GFR  Pure hypercholesterolemia Assessment & Plan: Currently on atorvastatin  10 mg daily. Well controlled when check in November 2024. Fasting this morning. Lipid panel today.   Orders: -     Lipid panel  Mixed hyperlipidemia Assessment & Plan: Currently on atorvastatin  10 mg daily. Well controlled when check in November 2024. Fasting this morning. Lipid panel today.   Orders: -     Atorvastatin  Calcium ; Take 1 tablet (10 mg total) by mouth daily.  Dispense: 90 tablet; Refill: 1  Major depressive disorder in full remission, unspecified whether recurrent Assessment & Plan: PHQ9 is 5 today, reports husband passed suddenly about a year ago. Controlled on fluoxetine . Reports strong social network of friends who are helping. Reports she is having trouble falling asleep or falling back asleep if she wake up. Bedtime is typically goes to bed around 10-11 and gets out of bed around 8-9am. Watches foot ball prior to bed. Has tried melatonin but this give her HA. Takes tylenol  PM. Discussed risk for cognitive and memory changes with  long term use of diphenhydramine. Discussed sleep hygiene and CBT-iCoach app.   Orders: -     FLUoxetine  HCl; Take 1 tablet (20 mg total) by mouth daily.  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    Harlene Saddler, MD

## 2024-05-14 NOTE — Assessment & Plan Note (Addendum)
 PHQ9 is 5 today, reports husband passed suddenly about a year ago. Controlled on fluoxetine . Reports strong social network of friends who are helping. Reports she is having trouble falling asleep or falling back asleep if she wake up. Bedtime is typically goes to bed around 10-11 and gets out of bed around 8-9am. Watches foot ball prior to bed. Has tried melatonin but this give her HA. Takes tylenol  PM. Discussed risk for cognitive and memory changes with long term use of diphenhydramine. Discussed sleep hygiene and CBT-iCoach app.

## 2024-05-14 NOTE — Assessment & Plan Note (Signed)
 Currently on hydrochlorothiazide  12.5 mg daily. Normotensive today. Continue current medication. Labs today.

## 2024-05-14 NOTE — Patient Instructions (Addendum)
 Try looking into Citracal to see if the pill size or look for a sugar free gummy for vitamin d ~1000 units daily and Calcium  1,200 mg daily   Please look into CBT-I coach app on your phone for difficulty sleep   Please call to schedule your mammogram at [phone]   Please work on keep legs elevated when sitting and wear compression, trying to limit salt to < 2,086mcg a day

## 2024-05-14 NOTE — Assessment & Plan Note (Addendum)
 Seeing Debbie Buel Cohn PA (endo) at [facility]. Is taking vitamin D cannot remember dose and is out of calcium . Encouraged her to restart and engaging in weight bearing exercise.

## 2024-05-14 NOTE — Assessment & Plan Note (Signed)
 Currently on atorvastatin  10 mg daily. Well controlled when check in November 2024. Fasting this morning. Lipid panel today.

## 2024-05-15 LAB — COMPREHENSIVE METABOLIC PANEL WITH GFR
ALT: 13 IU/L (ref 0–32)
AST: 18 IU/L (ref 0–40)
Albumin: 4.3 g/dL (ref 3.8–4.8)
Alkaline Phosphatase: 32 IU/L — ABNORMAL LOW (ref 49–135)
BUN/Creatinine Ratio: 32 — ABNORMAL HIGH (ref 12–28)
BUN: 18 mg/dL (ref 8–27)
Bilirubin Total: 0.4 mg/dL (ref 0.0–1.2)
CO2: 25 mmol/L (ref 20–29)
Calcium: 9.6 mg/dL (ref 8.7–10.3)
Chloride: 102 mmol/L (ref 96–106)
Creatinine, Ser: 0.56 mg/dL — ABNORMAL LOW (ref 0.57–1.00)
Globulin, Total: 2 g/dL (ref 1.5–4.5)
Glucose: 92 mg/dL (ref 70–99)
Potassium: 4.2 mmol/L (ref 3.5–5.2)
Sodium: 139 mmol/L (ref 134–144)
Total Protein: 6.3 g/dL (ref 6.0–8.5)
eGFR: 95 mL/min/1.73 (ref >59)

## 2024-05-15 LAB — LIPID PANEL
Chol/HDL Ratio: 2.1 ratio (ref 0.0–4.4)
Cholesterol, Total: 142 mg/dL (ref 100–199)
HDL: 69 mg/dL (ref >39)
LDL Chol Calc (NIH): 61 mg/dL (ref 0–99)
Triglycerides: 58 mg/dL (ref 0–149)
VLDL Cholesterol Cal: 12 mg/dL (ref 5–40)

## 2024-05-15 LAB — TSH: TSH: 1.31 u[IU]/mL (ref 0.450–4.500)

## 2024-05-15 LAB — HEMOGLOBIN A1C
Est. average glucose Bld gHb Est-mCnc: 103 mg/dL
Hgb A1c MFr Bld: 5.2 % (ref 4.8–5.6)

## 2024-05-15 LAB — PARATHYROID HORMONE, INTACT (NO CA): PTH: 35 pg/mL (ref 15–65)

## 2024-05-15 LAB — VITAMIN D 25 HYDROXY (VIT D DEFICIENCY, FRACTURES): Vit D, 25-Hydroxy: 29.5 ng/mL — ABNORMAL LOW (ref 30.0–100.0)

## 2024-05-17 ENCOUNTER — Encounter: Admitting: Student

## 2024-05-17 ENCOUNTER — Ambulatory Visit: Payer: Self-pay | Admitting: Student

## 2024-05-17 DIAGNOSIS — R200 Anesthesia of skin: Secondary | ICD-10-CM | POA: Insufficient documentation

## 2024-05-17 NOTE — Assessment & Plan Note (Signed)
 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

## 2024-05-19 ENCOUNTER — Ambulatory Visit: Admitting: Student

## 2024-06-08 ENCOUNTER — Encounter: Payer: Self-pay | Admitting: Student in an Organized Health Care Education/Training Program

## 2024-06-08 ENCOUNTER — Ambulatory Visit
Attending: Student in an Organized Health Care Education/Training Program | Admitting: Student in an Organized Health Care Education/Training Program

## 2024-06-08 VITALS — BP 130/86 | HR 83 | Temp 97.0°F | Resp 18 | Ht 68.0 in | Wt 153.0 lb

## 2024-06-08 DIAGNOSIS — M47816 Spondylosis without myelopathy or radiculopathy, lumbar region: Secondary | ICD-10-CM | POA: Diagnosis not present

## 2024-06-08 DIAGNOSIS — G894 Chronic pain syndrome: Secondary | ICD-10-CM | POA: Diagnosis not present

## 2024-06-08 NOTE — Patient Instructions (Signed)

## 2024-06-08 NOTE — Progress Notes (Signed)
 PROVIDER NOTE: Interpretation of information contained herein should be left to medically-trained personnel. Specific patient instructions are provided elsewhere under Patient Instructions section of medical record. This document was created in part using AI and STT-dictation technology, any transcriptional errors that may result from this process are unintentional.  Patient: Debbie Montgomery  Service: E/M   PCP: Lemon Raisin, MD  DOB: 22-Jul-1945  DOS: 06/08/2024  Provider: Wallie Sherry, MD  MRN: [id number]  Delivery: Face-to-face  Specialty: Interventional Pain Management  Type: Established Patient  Setting: Ambulatory outpatient facility  Specialty designation: 09  Referring Prov.: No ref. provider found  Location: Outpatient office facility       History of present illness (HPI) Ms. Debbie Montgomery, a 79 y.o. year old female, is here today because of her Lumbar facet arthropathy [M47.816]. Ms. Debbie Montgomery primary complain today is Back Pain  Pertinent problems: Lumbar facet arthropathy, lumbar degenerative disc disease  Pain Assessment: Severity of Chronic pain is reported as a 1 /10. Location: Back Lower/Denies. Onset: More than a month ago. Quality: Pressure. Timing: Constant. Modifying factor(s): Rest. Vitals:  height is 5' 8 (1.727 m) and weight is 153 lb (69.4 kg). Her temporal temperature is 97 F (36.1 C) (abnormal). Her blood pressure is 130/86 and her pulse is 83. Her respiration is 18 and oxygen saturation is 99%.  BMI: Estimated body mass index is 23.26 kg/m as calculated from the following:   Height as of this encounter: 5' 8 (1.727 m).   Weight as of this encounter: 153 lb (69.4 kg).  Last encounter: 04/06/2024. Last procedure: 03/10/2024.  Reason for encounter:   History of Present Illness   Debbie Montgomery is a 79 year old female with severe degenerative disc disease who presents with chronic lower back pain.  She experiences chronic lower back pain  primarily in the lower lumbar region, exacerbated by activities such as standing, walking, and performing household chores. The pain subsides within an hour of rest or with the use of heat or medication. It is manageable when she remains sedentary but intensifies with increased activity.  She has a history of an old compression fracture at L1-L2, treated with vertebroplasty, and a peripheral nerve stimulator was previously placed at L4-L5. The stimulator was removed in October 2025, prior to a trip to [place] , where she experienced significant pain and difficulty walking long distances.  The pain is more pronounced on the right side, with occasional 'hitch' or pain in the right hip, which she associates with her sacroiliac joint. She has a history of hip surgery, resulting in a change in her gait due to a lengthening of her right leg by an inch, to which she has since adjusted.  The peripheral nerve stimulator previously provided significant relief, particularly for pain radiating down her right leg, which has since been alleviated.  She is not engaging in regular stretching or yoga exercises and has used hydrocodone  sparingly, taking approximately six doses over the past two months.      UDS:  Summary  Date Value Ref Range Status  04/06/2024 FINAL  Final    Comment:    ==================================================================== Compliance Drug Analysis, Ur ==================================================================== Test                             Result       Flag       Units  Drug Present and Declared for Prescription Verification   Fluoxetine   PRESENT      EXPECTED   Norfluoxetine                  PRESENT      EXPECTED    Norfluoxetine is an expected metabolite of fluoxetine .    Acetaminophen                   PRESENT      EXPECTED   Salicylate                     PRESENT      EXPECTED  Drug Present not Declared for Prescription Verification    Doxylamine                     PRESENT      UNEXPECTED  Drug Absent but Declared for Prescription Verification   Hydrocodone                     Not Detected UNEXPECTED ng/mg creat ==================================================================== Test                      Result    Flag   Units      Ref Range   Creatinine              32               mg/dL      >=79 ==================================================================== Declared Medications:  The flagging and interpretation on this report are based on the  following declared medications.  Unexpected results may arise from  inaccuracies in the declared medications.   **Note: The testing scope of this panel includes these medications:   Fluoxetine  (Prozac )  Hydrocodone  (Norco)   **Note: The testing scope of this panel does not include small to  moderate amounts of these reported medications:   Acetaminophen  (Tylenol )  Acetaminophen  (Norco)  Aspirin   **Note: The testing scope of this panel does not include the  following reported medications:   Atorvastatin  (Lipitor)  Caffeine  Calcium   Denosumab (Prolia)  Estradiol (Estrace)  Hydrochlorothiazide  (Hydrodiuril )  Supplement (Fiber) ==================================================================== For clinical consultation, please call [phone]. ====================================================================     No results found for: CBDTHCR No results found for: D8THCCBX No results found for: D9THCCBX  ROS  Constitutional: Denies any fever or chills Gastrointestinal: No reported hemesis, hematochezia, vomiting, or acute GI distress Musculoskeletal: Axial low back pain Neurological: No reported episodes of acute onset apraxia, aphasia, dysarthria, agnosia, amnesia, paralysis, loss of coordination, or loss of consciousness  Medication Review  Aspirin-Acetaminophen -Caffeine, FLUoxetine , Fiber, HYDROcodone -acetaminophen , OVER THE  COUNTER MEDICATION, atorvastatin , denosumab, diphenhydramine-acetaminophen , estradiol, and hydrochlorothiazide   History Review  Allergy: Ms. Debbie Montgomery has no known allergies. Drug: Ms. Debbie Montgomery  reports no history of drug use. Alcohol:  reports current alcohol use of about 1.0 standard drink of alcohol per week. Tobacco:  reports that she has quit smoking. She has never used smokeless tobacco. Social: Ms. Debbie Montgomery  reports that she has quit smoking. She has never used smokeless tobacco. She reports current alcohol use of about 1.0 standard drink of alcohol per week. She reports that she does not use drugs. Medical:  has a past medical history of Arthritis, DDD (degenerative disc disease), lumbar, HLD (hyperlipidemia), Hypertension, and New onset left bundle branch block (LBBB) (07/31/2022). Surgical: Ms. Debbie Montgomery  has a past surgical history that includes Laser ablation (Right); Tonsillectomy and adenoidectomy; Abdominal hysterectomy; Eye surgery (2005); Colonoscopy; Tubal ligation; bladder  lift (1995); IR Radiologist Eval & Mgmt (04/02/2022); IR KYPHO LUMBAR INC FX REDUCE BONE BX UNI/BIL CANNULATION INC/IMAGING (04/09/2022); IR Radiologist Eval & Mgmt (04/19/2022); Total hip arthroplasty (Right, 08/12/2022); and Colonoscopy with propofol  (N/A, 11/29/2022). Family: family history includes Heart attack in her maternal grandmother; Heart disease in her father; Hypertension in her mother; Stroke in her maternal grandmother.  Laboratory Chemistry Profile   Renal Lab Results  Component Value Date   BUN 18 05/14/2024   CREATININE 0.56 (L) 05/14/2024   BCR 32 (H) 05/14/2024   GFRAA 103 12/17/2019   GFRNONAA >60 08/13/2022    Hepatic Lab Results  Component Value Date   AST 18 05/14/2024   ALT 13 05/14/2024   ALBUMIN 4.3 05/14/2024   ALKPHOS 32 (L) 05/14/2024    Electrolytes Lab Results  Component Value Date   NA 139 05/14/2024   K 4.2 05/14/2024   CL 102 05/14/2024   CALCIUM  9.6  05/14/2024   PHOS 4.3 04/21/2023    Bone Lab Results  Component Value Date   VD25OH 29.5 (L) 05/14/2024    Inflammation (CRP: Acute Phase) (ESR: Chronic Phase) No results found for: CRP, ESRSEDRATE, LATICACIDVEN       Note: Above Lab results reviewed.  Recent Imaging Review  CLINICAL DATA:  Low back pain history of prior fracture.   EXAM: MRI LUMBAR SPINE WITHOUT CONTRAST   TECHNIQUE: Multiplanar, multisequence MR imaging of the lumbar spine was performed. No intravenous contrast was administered.   COMPARISON:  MRI lumbar December 26, 2022   FINDINGS: Segmentation:  Transitional lumbosacral anatomy lumbarization of S1.   Alignment:  Minimal scoliosis as described on the plain films   Vertebrae:  Old fracture deformity of L1   Conus medullaris and cauda equina: Conus extends to the L1 level. Conus and cauda equina appear normal.   Paraspinal and other soft tissues: Normal   Disc levels:   T12 - L1 No disc protrusion. No foraminal stenosis. No central canal stenosis.   L1 - L2 old compression fracture deformity of L1 less than 25% loss of height of the inferior endplate. With very small retropulsed fragment in the canal along the posteroinferior corner unchanged since prior examination. Prior vertebroplasty. No canal space-occupying lesions.   L2 - L3 mildly desiccated disc material without disc herniations or foraminal narrowing   L3 - L4 desiccated disc material with a small right paracentral right lateral spondylitic disc herniation partially encroaching the right neural foramina   L4 - L5 severe degenerative disc disease with desiccated disc material narrowing of the disc space. With central, left paracentral spondylitic disc herniation flattening the thecal sac and encroaching on both neural foramina, left more than right.   L5 - S1 narrowing of the disc space with a central bulging disc flattening the thecal sac without foraminal narrowing.    IMPRESSION: *Old compression fracture deformity of L1 with prior vertebroplasty. *Multilevel degenerative disc disease with spondylitic disc herniations at L3-4, L4-5 and L5-S1 as described.     Electronically Signed   By: Franky Chard M.D.   On: 09/12/2023 13:00 Note: Reviewed        Physical Exam  Vitals: BP 130/86 (BP Location: Right Arm, Patient Position: Sitting)   Pulse 83   Temp (!) 97 F (36.1 C) (Temporal)   Resp 18   Ht 5' 8 (1.727 m)   Wt 153 lb (69.4 kg)   SpO2 99%   BMI 23.26 kg/m  BMI: Estimated body mass index is 23.26 kg/m  as calculated from the following:   Height as of this encounter: 5' 8 (1.727 m).   Weight as of this encounter: 153 lb (69.4 kg). Ideal: Ideal body weight: 63.9 kg (140 lb 14 oz) Adjusted ideal body weight: 66.1 kg (145 lb 11.6 oz) General appearance: Well nourished, well developed, and well hydrated. In no apparent acute distress Mental status: Alert, oriented x 3 (person, place, & time)       Respiratory: No evidence of acute respiratory distress Eyes: PERLA Lumbar Spine Area Exam  Skin & Axial Inspection: No masses, redness, or swelling Alignment: Symmetrical Functional ROM: Pain restricted ROM affecting both sides Stability: No instability detected Muscle Tone/Strength: Functionally intact. No obvious neuro-muscular anomalies detected. Sensory (Neurological): Musculoskeletal pain pattern Palpation: Complains of area being tender to palpation       Provocative Tests: Lumbar quadrant test (Kemp's test): (+) bilaterally for facet joint pain.  Gait & Posture Assessment  Ambulation: Unassisted Gait: Relatively normal for age and body habitus Posture: WNL  Lower Extremity Exam    Side: Right lower extremity  Side: Left lower extremity  Stability: No instability observed          Stability: No instability observed          Skin & Extremity Inspection: Skin color, temperature, and hair growth are WNL. No peripheral edema or  cyanosis. No masses, redness, swelling, asymmetry, or associated skin lesions. No contractures.  Skin & Extremity Inspection: Skin color, temperature, and hair growth are WNL. No peripheral edema or cyanosis. No masses, redness, swelling, asymmetry, or associated skin lesions. No contractures.  Functional ROM: Unrestricted ROM                  Functional ROM: Unrestricted ROM                  Muscle Tone/Strength: Functionally intact. No obvious neuro-muscular anomalies detected.  Muscle Tone/Strength: Functionally intact. No obvious neuro-muscular anomalies detected.  Sensory (Neurological): Unimpaired        Sensory (Neurological): Unimpaired        DTR: Patellar: deferred today Achilles: deferred today Plantar: deferred today  DTR: Patellar: deferred today Achilles: deferred today Plantar: deferred today  Palpation: No palpable anomalies  Palpation: No palpable anomalies    Assessment   Diagnosis  1. Lumbar facet arthropathy   2. Lumbar spondylosis   3. Chronic pain syndrome      Updated Problems: No problems updated.  Plan of Care  Problem-specific:  Assessment and Plan    Lumbar degenerative disc disease with chronic pain   Chronic lumbar pain is primarily in the lower lumbar region, worsened by activities like walking and standing. MRI reveals severe degenerative disc disease at L4-L5 with a small disc herniation causing foraminal narrowing. Pain likely results from severe L4-L5 disease and an old compression fracture at L1-L2, previously treated with vertebroplasty. Pain subsides within an hour with rest or heat. Hydrocodone  is used sparingly, with only six doses taken in two months. Approval for peripheral nerve stimulator placement on the right side has been submitted. A follow-up appointment will be scheduled once approval is obtained.  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 Pain is localized to the piriformis muscle area and may be exacerbated by previous hip surgery, which altered anatomy and potentially affected the sacroiliac joint. There is no significant radiation of pain down the leg, indicating improvement  from previous symptoms.  Peripheral nerve stimulator placement (planned)   A peripheral nerve stimulator was previously removed in October. She is interested in repeating the procedure on the right side, as it previously alleviated significant pain in the right leg. A new device with a single battery for bilateral stimulation has been approved by institution. The procedure will be performed one side at a time, with the second side connected to the same battery once completed.   I reviewed again with the pt how Sprint peripheral nerve stimulation (PNS)  is typically considered for patients with chronic, localized pain that is not responding to conservative treatments such as medications, physical therapy, or injections.  The SPRINT peripheral nerve stimulator is designed for short-term, percutaneous use (approximately 60 days) to modulate pain through targeted nerve stimulation. Unlike traditional permanent implants, SPRINT is temporary but can lead to long-lasting pain relief by altering pain signals.  We discussed the risks and benefits of peripheral nerve stimulation. Benefits: minimally invasive, does not require permanent implantation, can offer significant pain relief, improving function and quality of life, may reduce the need for long-term opioid use. The risks/challenges include (but not limited to):  infection or irritation at the stimulation site, discomfort from electrode placement, risk of incomplete pain relief or temporary relief post-removal, limited to short-term therapy, which may be a disadvantage in chronic, refractory cases          Debbie Montgomery has a current medication list which includes the following  long-term medication(s): atorvastatin , diphenhydramine-acetaminophen , fluoxetine , and hydrochlorothiazide .  Pharmacotherapy (Medications Ordered): No orders of the defined types were placed in this encounter.  Orders:  Orders Placed This Encounter  Procedures   Implantable Peripheral Nerve Stimulator    Standing Status:   Future    Expected Date:   06/28/2024    Expiration Date:   06/08/2025    Scheduling Instructions:     Procedure: Temporary implantable peripheral nerve stimulator     Side:  RIGHT     Nerve site: L4     Sedation: Patient's choice.     Timeframe: ASAA    Location of Procedure:   [facility]     Right L4 PNS 10/20/23, Left L4 PNS 01/14/24    Return in about 15 days (around 06/23/2024) for Right L4 PNS.    Recent Visits Date Type Provider Dept  04/06/24 Office Visit Marcelino Nurse, MD [facility]  03/10/24 Procedure visit Marcelino Nurse, MD [facility]  Showing recent visits within past 90 days and meeting all other requirements Today's Visits Date Type Provider Dept  06/08/24 Office Visit Marcelino Nurse, MD [facility]  Showing today's visits and meeting all other requirements Future Appointments No visits were found meeting these conditions. Showing future appointments within next 90 days and meeting all other requirements  I discussed the assessment and treatment plan with the patient. The patient was provided an opportunity to ask questions and all were answered. The patient agreed with the plan and demonstrated an understanding of the instructions.  Patient advised to call back or seek an in-person evaluation if the symptoms or condition worsens.  I personally spent a total of 30 minutes in the care of the patient today including preparing to see the patient, getting/reviewing separately obtained history, performing a medically appropriate exam/evaluation, counseling and educating, placing orders, and documenting  clinical information in the EHR.   Note by: Nurse Marcelino, MD (TTS and AI technology used. I apologize for any  typographical errors that were not detected and corrected.) Date: 06/08/2024; Time: 9:29 AM

## 2024-06-11 ENCOUNTER — Other Ambulatory Visit: Payer: Self-pay | Admitting: Medical Genetics

## 2024-06-17 ENCOUNTER — Ambulatory Visit

## 2024-06-17 VITALS — BP 132/79 | HR 69 | Ht 68.0 in | Wt 159.4 lb

## 2024-06-17 DIAGNOSIS — Z0000 Encounter for general adult medical examination without abnormal findings: Secondary | ICD-10-CM

## 2024-06-17 DIAGNOSIS — Z1231 Encounter for screening mammogram for malignant neoplasm of breast: Secondary | ICD-10-CM | POA: Diagnosis not present

## 2024-06-17 NOTE — Progress Notes (Signed)
 "  Chief Complaint  Patient presents with   Medicare Wellness     Subjective:   Debbie Montgomery is a 79 y.o. female who presents for a Medicare Annual Wellness Visit.  Visit info / Clinical Intake: Medicare Wellness Visit Type:: Subsequent Annual Wellness Visit Persons participating in visit and providing information:: patient Medicare Wellness Visit Mode:: In-person (required for WTM) Interpreter Needed?: No Pre-visit prep was completed: yes AWV questionnaire completed by patient prior to visit?: yes Date:: 06/13/24 Living arrangements:: (!) (Patient-Rptd) lives alone Patient's Overall Health Status Rating: (Patient-Rptd) good Typical amount of pain: (Patient-Rptd) some Does pain affect daily life?: (!) (Patient-Rptd) yes Are you currently prescribed opioids?: (!) yes  Dietary Habits and Nutritional Risks How many meals a day?: (Patient-Rptd) 2 Eats fruit and vegetables daily?: (Patient-Rptd) yes Most meals are obtained by: (Patient-Rptd) preparing own meals In the last 2 weeks, have you had any of the following?: none Diabetic:: no  Functional Status Activities of Daily Living (to include ambulation/medication): (Patient-Rptd) Independent Ambulation: (Patient-Rptd) Independent Medication Administration: (Patient-Rptd) Independent Home Management (perform basic housework or laundry): (Patient-Rptd) Independent Manage your own finances?: (Patient-Rptd) yes Primary transportation is: (Patient-Rptd) driving Concerns about vision?: no *vision screening is required for WTM* Concerns about hearing?: no  Fall Screening Falls in the past year?: (Patient-Rptd) 0 Number of falls in past year: 0 Was there an injury with Fall?: 0 Fall Risk Category Calculator: 0 Patient Fall Risk Level: Low Fall Risk  Fall Risk Patient at Risk for Falls Due to: No Fall Risks Fall risk Follow up: Falls evaluation completed; Education provided; Falls prevention discussed  Home and  Transportation Safety: All rugs have non-skid backing?: (Patient-Rptd) yes All stairs or steps have railings?: (Patient-Rptd) N/A, no stairs Grab bars in the bathtub or shower?: (!) (Patient-Rptd) no Have non-skid surface in bathtub or shower?: (Patient-Rptd) yes Good home lighting?: (Patient-Rptd) yes Regular seat belt use?: (Patient-Rptd) yes Hospital stays in the last year:: (Patient-Rptd) no  Cognitive Assessment Difficulty concentrating, remembering, or making decisions? : (Patient-Rptd) no Will 6CIT or Mini Cog be Completed: yes What year is it?: 0 points What month is it?: 0 points Give patient an address phrase to remember (5 components): [address] [place]  About what time is it?: 0 points Count backwards from 20 to 1: 0 points Say the months of the year in reverse: 0 points Repeat the address phrase from earlier: 0 points 6 CIT Score: 0 points  Advance Directives (For Healthcare) Does Patient Have a Medical Advance Directive?: No Does patient want to make changes to medical advance directive?: No - Guardian declined Type of Advance Directive: Healthcare Power of [place]; Living will Copy of Healthcare Power of Attorney in Chart?: No - copy requested Copy of Living Will in Chart?: No - copy requested Would patient like information on creating a medical advance directive?: Yes (ED - Information included in AVS)  Reviewed/Updated  Reviewed/Updated: Reviewed All (Medical, Surgical, Family, Medications, Allergies, Care Teams, Patient Goals)    Allergies (verified) Patient has no known allergies.   Current Medications (verified) Outpatient Encounter Medications as of 06/17/2024  Medication Sig   Aspirin-Acetaminophen -Caffeine (EXCEDRIN PO) Take by mouth.   atorvastatin  (LIPITOR) 10 MG tablet Take 1 tablet (10 mg total) by mouth daily.   [employer] FIBER GUMMIES PO Take by mouth.   diphenhydramine-acetaminophen  (TYLENOL  PM) 25-500 MG TABS tablet Take 1 tablet by mouth  at bedtime as needed.   estradiol (ESTRACE) 0.01 % CREA vaginal cream Place 1 Applicatorful  vaginally 2 (two) times a week.   FLUoxetine  (PROZAC ) 20 MG tablet Take 1 tablet (20 mg total) by mouth daily.   hydrochlorothiazide  (HYDRODIURIL ) 12.5 MG tablet Take 1 tablet (12.5 mg total) by mouth daily.   HYDROcodone -acetaminophen  (NORCO/VICODIN) 5-325 MG tablet Take 1-2 tablets by mouth daily as needed.   OVER THE COUNTER MEDICATION Garcinia Cambogia Extract 1600 mg  Calcium  222 mg   PROLIA 60 MG/ML SOSY injection Inject into the skin.   No facility-administered encounter medications on file as of 06/17/2024.    History: Past Medical History:  Diagnosis Date   Arthritis    DDD (degenerative disc disease), lumbar    HLD (hyperlipidemia)    Hypertension    New onset left bundle branch block (LBBB) 07/31/2022   a.) noted on preoperative ECG; (+) associated retrosternal chest pressured for ~ 10 days.   Osteoporosis Oct 2023   Past Surgical History:  Procedure Laterality Date   ABDOMINAL HYSTERECTOMY     bladder lift  1995   COLONOSCOPY     COLONOSCOPY WITH PROPOFOL  N/A 11/29/2022   Procedure: COLONOSCOPY WITH PROPOFOL ;  Surgeon: Maryruth Ole DASEN, MD;  Location: [facility] ENDOSCOPY;  Service: Endoscopy;  Laterality: N/A;   EYE SURGERY  2005   lasix   FRACTURE SURGERY  Sep 2022   IR KYPHO LUMBAR INC FX REDUCE BONE BX UNI/BIL CANNULATION INC/IMAGING  04/09/2022   IR RADIOLOGIST EVAL & MGMT  04/02/2022   IR RADIOLOGIST EVAL & MGMT  04/19/2022   JOINT REPLACEMENT  [id number]   Rt hip   LASER ABLATION Right    TONSILLECTOMY AND ADENOIDECTOMY     as a child   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   Procedure: TOTAL HIP ARTHROPLASTY ANTERIOR APPROACH;  Surgeon: Lorelle Hussar, MD;  Location: [facility] ORS;  Service: Orthopedics;  Laterality: Right;   TUBAL LIGATION     Family History  Problem Relation Age of Onset   Hypertension Mother    Heart disease Father    Heart attack Maternal  Grandmother    Stroke Maternal Grandmother    Arthritis Sister    Social History   Occupational History   Not on file  Tobacco Use   Smoking status: Former    Current packs/day: 0.00    Average packs/day: 1 pack/day for 15.0 years (15.0 ttl pk-yrs)    Types: Cigarettes    Quit date: 01/04/2012    Years since quitting: 12.4   Smokeless tobacco: Never   Tobacco comments:    Uses vape w/ nicotine  Vaping Use   Vaping status: Every Day  Substance and Sexual Activity   Alcohol use: Yes    Alcohol/week: 4.0 standard drinks of alcohol    Types: 4 Glasses of wine per week    Comment: ocassionally   Drug use: Never   Sexual activity: Yes    Birth control/protection: None   Tobacco Counseling Counseling given: Not Answered Tobacco comments: Uses vape w/ nicotine  SDOH Screenings   Food Insecurity: No Food Insecurity (06/17/2024)  Housing: Low Risk (06/17/2024)  Transportation Needs: No Transportation Needs (06/17/2024)  Utilities: Not At Risk (06/17/2024)  Alcohol Screen: Low Risk (04/03/2024)  Depression (PHQ2-9): Low Risk (06/17/2024)  Recent Concern: Depression (PHQ2-9) - Medium Risk (05/14/2024)  Financial Resource Strain: Low Risk (04/03/2024)  Physical Activity: Sufficiently Active (06/17/2024)  Social Connections: Socially Isolated (06/17/2024)  Stress: No Stress Concern Present (06/17/2024)  Tobacco Use: Medium Risk (06/17/2024)  Health Literacy: Adequate Health Literacy (06/17/2024)   See flowsheets  for full screening details  Depression Screen PHQ 2 & 9 Depression Scale- Over the past 2 weeks, how often have you been bothered by any of the following problems? Little interest or pleasure in doing things: 0 Feeling down, depressed, or hopeless (PHQ Adolescent also includes...irritable): 0 PHQ-2 Total Score: 0 Trouble falling or staying asleep, or sleeping too much: 3 Feeling tired or having little energy: 2 Poor appetite or overeating (PHQ Adolescent also includes...weight  loss): 0 Feeling bad about yourself - or that you are a failure or have let yourself or your family down: 0 Trouble concentrating on things, such as reading the newspaper or watching television (PHQ Adolescent also includes...like school work): 0 Moving or speaking so slowly that other people could have noticed. Or the opposite - being so fidgety or restless that you have been moving around a lot more than usual: 0 Thoughts that you would be better off dead, or of hurting yourself in some way: 0 PHQ-9 Total Score: 5 If you checked off any problems, how difficult have these problems made it for you to do your work, take care of things at home, or get along with other people?: Not difficult at all     Goals Addressed             This Visit's Progress    DIET - EAT MORE FRUITS AND VEGETABLES   On track    Patient Stated       Get pain under control            Objective:    Today's Vitals   06/17/24 0944 06/17/24 1013  BP: (!) 144/80 132/79  Pulse: 66 69  Weight: 159 lb 6.4 oz (72.3 kg)   Height: 5' 8 (1.727 m)    Body mass index is 24.24 kg/m.  Hearing/Vision screen No results found. Immunizations and Health Maintenance Health Maintenance  Topic Date Due   Zoster Vaccines- Shingrix (1 of 2) Never done   Mammogram  05/24/2023   COVID-19 Vaccine (9 - 2025-26 season) 08/18/2024   Medicare Annual Wellness (AWV)  06/17/2025   Colonoscopy  11/29/2027   DTaP/Tdap/Td (4 - Td or Tdap) 06/02/2028   Pneumococcal Vaccine: 50+ Years  Completed   Influenza Vaccine  Completed   Bone Density Scan  Completed   Hepatitis C Screening  Completed   Meningococcal B Vaccine  Aged Out        Assessment/Plan:  This is a routine wellness examination for [place].  Patient Care Team: Lemon Raisin, MD as PCP - General (Internal Medicine)  I have personally reviewed and noted the following in the patients chart:   Medical and social history Use of alcohol, tobacco or illicit drugs   Current medications and supplements including opioid prescriptions. Functional ability and status Nutritional status Physical activity Advanced directives List of other physicians Hospitalizations, surgeries, and ER visits in previous 12 months Vitals Screenings to include cognitive, depression, and falls Referrals and appointments  No orders of the defined types were placed in this encounter.  In addition, I have reviewed and discussed with patient certain preventive protocols, quality metrics, and best practice recommendations. A written personalized care plan for preventive services as well as general preventive health recommendations were provided to patient.   Erminio LITTIE Saris, LPN   8/84/7973   Return in 1 year (on 06/17/2025) for annual wellness.  After Visit Summary: (In Person-Declined) Patient declined AVS at this time.  Nurse Notes: No voiced or noted concerns at  this time Patient advised to keep follow-up appointment with PCP (June 2026) Appointment(s) made: (Jan 2027 AWV) Vaccines not given: Shingles declined today HM Addressed: Mammogram ordered  "

## 2024-06-17 NOTE — Patient Instructions (Addendum)
 You have an order for:  []   2D Mammogram  [x]   3D Mammogram  []   Bone Density     Please call for appointment:  [facility]  [address] [place] [address] [phone]  [facility] [address] [place], [place] [address] [phone]  [facility] [address] [place], [place] [address] [phone]    Make sure to wear two-piece clothing.  No lotions, powders, or deodorants the day of the appointment. Make sure to bring picture ID and insurance card.  Bring list of medications you are currently taking including any supplements.  Ms. Govan,  Thank you for taking the time for your Medicare Wellness Visit. I appreciate your continued commitment to your health goals. Please review the care plan we discussed, and feel free to reach out if I can assist you further.  Please note that Annual Wellness Visits do not include a physical exam. Some assessments may be limited, especially if the visit was conducted virtually. If needed, we may recommend an in-person follow-up with your provider.  Ongoing Care Seeing your primary care provider every 3 to 6 months helps us  monitor your health and provide consistent, personalized care.   Referrals If a referral was made during today's visit and you haven't received any updates within two weeks, please contact the referred provider directly to check on the status.  Recommended Screenings:  Health Maintenance  Topic Date Due   Zoster (Shingles) Vaccine (1 of 2) Never done   Breast Cancer Screening  05/24/2023   Medicare Annual Wellness Visit  09/19/2023   COVID-19 Vaccine (9 - 2025-26 season) 08/18/2024   Colon Cancer Screening  11/29/2027   DTaP/Tdap/Td vaccine (4 - Td or Tdap) 06/02/2028   Pneumococcal Vaccine for age over 76  Completed   Flu Shot  Completed   Osteoporosis screening with  Bone Density Scan  Completed   Hepatitis C Screening  Completed   Meningitis B Vaccine  Aged Out       06/08/2024    8:18 AM  Advanced Directives  Does Patient Have a Medical Advance Directive? No  Would patient like information on creating a medical advance directive? No - Patient declined    Vision: Annual vision screenings are recommended for early detection of glaucoma, cataracts, and diabetic retinopathy. These exams can also reveal signs of chronic conditions such as diabetes and high blood pressure.  Dental: Annual dental screenings help detect early signs of oral cancer, gum disease, and other conditions linked to overall health, including heart disease and diabetes.  Please see the attached documents for additional preventive care recommendations.

## 2024-06-21 ENCOUNTER — Encounter: Payer: Self-pay | Admitting: Student in an Organized Health Care Education/Training Program

## 2024-06-21 ENCOUNTER — Ambulatory Visit: Admitting: Student in an Organized Health Care Education/Training Program

## 2024-06-21 ENCOUNTER — Ambulatory Visit
Admission: RE | Admit: 2024-06-21 | Discharge: 2024-06-21 | Disposition: A | Discharge status: Home / Self Care | Source: Ambulatory Visit | Attending: Student in an Organized Health Care Education/Training Program | Admitting: Student in an Organized Health Care Education/Training Program

## 2024-06-21 DIAGNOSIS — G894 Chronic pain syndrome: Secondary | ICD-10-CM | POA: Diagnosis not present

## 2024-06-21 DIAGNOSIS — M47816 Spondylosis without myelopathy or radiculopathy, lumbar region: Secondary | ICD-10-CM | POA: Insufficient documentation

## 2024-06-21 MED ORDER — LIDOCAINE HCL 2 % IJ SOLN
20.0000 mL | Freq: Once | INTRAMUSCULAR | Status: AC
  Administered 2024-06-21: 400 mg
  Filled 2024-06-21: qty 20

## 2024-06-21 NOTE — Patient Instructions (Signed)

## 2024-06-21 NOTE — Progress Notes (Signed)
 Safety precautions to be maintained throughout the outpatient stay will include: orient to surroundings, keep bed in low position, maintain call bell within reach at all times, provide assistance with transfer out of bed and ambulation.

## 2024-06-21 NOTE — Progress Notes (Signed)
 PROVIDER NOTE: Interpretation of information contained herein should be left to medically-trained personnel. Specific patient instructions are provided elsewhere under Patient Instructions section of medical record. This document was created in part using STT-dictation technology, any transcriptional errors that may result from this process are unintentional.  Patient: Debbie Montgomery Type: Established DOB: 12/17/1945 MRN: [id number] PCP: Lemon Raisin, MD  Service: Procedure DOS: 06/21/2024 Setting: Ambulatory Location: Ambulatory outpatient facility Delivery: Face-to-face Provider: Wallie Sherry, MD Specialty: Interventional Pain Management Specialty designation: 09 Location: Outpatient facility Ref. Prov.: Sherry Wallie, MD       Interventional Therapy   Primary Reason for Admission: Surgical management of chronic pain condition.  Procedure:  Anesthesia, Analgesia, Anxiolysis:  Type: SPRINTTM Peripheral Nerve Field Stimulator (PNS) MicroLeadTM Implant #2 Purpose: Therapeutic Region: Lumbosacral Level: L4-5 Facet Medial Branch Nerve Laterality: Right           Anesthesia: Local (1-2% Lidocaine )  Anxiolysis: None  Sedation: None  Guidance: Fluoroscopy & US  CPT (23057)    1. Lumbar facet arthropathy   2. Lumbar spondylosis   3. Chronic pain syndrome    NAS-11 Pain score:   Pre-procedure: 0-No pain/10   Post-procedure: 0-No pain/10   H&P (Pre-op Assessment):  Ms. Hartl is a 79 y.o. (year old), female patient, seen today for interventional treatment. 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   The patient is a 79 year old female with chronic right-sided low back pain who previously underwent placement of a right L4 peripheral nerve stimulator (SPRINT system) on 10/20/2023, which was removed on 12/24/2023 after completion of therapy. She experienced greater than 50% pain relief and meaningful functional improvement during and following the initial treatment period. Over the past several months, she has noted recurrence and gradual worsening of her right lower back pain. Given her prior robust response to peripheral nerve stimulation and failure of conservative therapies, repeat placement of a right L4 SPRINT peripheral nerve stimulator is medically indicated to provide analgesia and improve function. The risks, benefits, and alternatives were discussed, and the patient wishes to proceed.  Initial Vital Signs:  Pulse/EKG Rate: 80ECG Heart Rate: 77 Temp: (!) 97 F (36.1 C) Resp: 16 BP: (!) 143/82 SpO2: 99 %  BMI: Estimated body mass index is 23.57 kg/m as calculated from the following:   Height as of this encounter: 5' 8 (1.727 m).   Weight as of this encounter: 155 lb (70.3 kg).  Risk Assessment: Allergies: Reviewed. She has no known allergies.  Allergy Precautions: None required Coagulopathies: Reviewed. None identified.  Blood-thinner therapy: None at this time Active Infection(s): Reviewed. None identified. Ms. Terrill is afebrile  Site Confirmation: Ms. Birky was asked to confirm the procedure and laterality before marking the site, which she did. Procedure checklist: Completed Consent: Before the procedure and under the influence of no sedative(s), amnesic(s), or anxiolytics, the patient was informed of the treatment options, risks and possible complications. To fulfill our ethical and legal obligations, as recommended by the American Medical Association's Code of Ethics, I have  informed the patient of my clinical impression; the nature and purpose of the treatment or procedure; the risks, benefits, and possible complications  of the intervention; the alternatives, including doing nothing; the risk(s) and benefit(s) of the alternative treatment(s) or procedure(s); and the risk(s) and benefit(s) of doing nothing.  Ms. Tiznado was provided with information about the general risks and possible complications associated with most interventional procedures. These include, but are not limited to: failure to achieve desired goals, infection, bleeding, organ or nerve damage, allergic reactions, paralysis, and/or death.  In addition, she was informed of those risks and possible complications associated to this particular procedure, which include, but are not limited to: damage to the implant; failure to decrease pain; local, systemic, or serious CNS infections, intraspinal abscess with possible cord compression and paralysis, or life-threatening such as meningitis; intrathecal and/or epidural bleeding with formation of hematoma with possible spinal cord compression and permanent paralysis; organ damage; nerve injury or damage with subsequent sensory, motor, and/or autonomic system dysfunction, resulting in transient or permanent pain, numbness, and/or weakness of one or several areas of the body; allergic reactions, either minor or major life-threatening, such as anaphylactic or anaphylactoid reactions.  Furthermore, Ms. Adcox was informed of those risks and complications associated with the medications. These include, but are not limited to: allergic reactions (i.e.: anaphylactic or anaphylactoid reactions); arrhythmia;  Hypotension/hypertension; cardiovascular collapse; respiratory depression and/or shortness of breath; swelling or edema; medication-induced neural toxicity; particulate matter embolism and blood vessel occlusion with resultant organ, and/or nervous system infarction and  permanent paralysis.  Finally, she was informed that Medicine is not an exact science; therefore, there is also the possibility of unforeseen or unpredictable risks and/or possible complications that may result in a catastrophic outcome. The patient indicated having understood very clearly. We have given the patient no guarantees and we have made no promises. Enough time was given to the patient to ask questions, all of which were answered to the patient's satisfaction. Ms. Sydney has indicated that she wanted to continue with the procedure. Attestation: I, the ordering provider, attest that I have discussed with the patient the benefits, risks, side-effects, alternatives, likelihood of achieving goals, and potential problems during recovery for the procedure that I have provided informed consent. Date  Time: 06/21/2024  8:15 AM  Pre-Procedure Preparation:  Monitoring: As per clinic protocol. Respiration, ETCO2, SpO2, BP, heart rate and rhythm monitor placed and checked for adequate function Safety Precautions: Patient was assessed for positional comfort and pressure points before starting the procedure. Time-out: I initiated and conducted the Time-out before starting the procedure, as per protocol. The patient was asked to participate by confirming the accuracy of the Time Out information. Verification of the correct person, site, and procedure were performed and confirmed by me, the nursing staff, and the patient. Time-out conducted as per Joint Commission's Universal Protocol (UP.01.01.01). Time: 0912 Start Time: 0912 hrs.  Description of Procedure Process:   Position: Prone Target Area: <1 cm from targeted nerve (Medial Branch Nerve) Approach: Posterior percutaneous, paramedial, interlaminar approach Area Prepped: Entire Lumbosacral Region Prepping solution: ChloraPrep (2% chlorhexidine  gluconate and 70% isopropyl alcohol) Safety Precautions: Safe injection practices and needle  disposal techniques used. Medications properly checked for expiration dates. SDV (single dose vial) medications used. Aspiration looking for blood return and/or CSF was conducted prior to all injections. At no point did I inject any substances, as a needle was being advanced. No attempts were made at seeking any paresthesias.  Description of the Procedure (Lumbar Medial Branch):  Availability of a responsible, adult driver, and NPO status confirmed. Informed consent was obtained after having discussed risks and possible  complications. An IV was started. The patient was then taken to the fluoroscopy suite, where the patient was placed in position for the procedure, over the fluoroscopy table. The patient was then monitored in the usual manner. Fluoroscopy was manipulated to obtain the best possible view of the target. Parallex error was corrected before commencing the procedure. Once a clear view of the target had been obtained, the skin and subcutaneous tissue over the procedure site were infiltrated using lidocaine , loaded in a 10 cc luer-loc syringe with a 0.5 inch, 25-G needle. Care was taken to avoid numbing deeper tissues The introducer needle(s) was/were then inserted through the skin and deeper tissues, specifically the multifidus muscle.  An introducer needle and stimulating probe were  assembled, inserted and advanced along the intended course of the medial branch nerve as it traverses the lamina medial and inferior to the zygapophyseal joint, taking care to maintain the proper depth of insertion as the introducer is advanced under fluoroscopic. The introducer needle was delivered to a location in proximity to the nerve. Multiple stimulation parameters were used to deliver stimulation to the medial branch nerve in concert with stimulating at multiple positions around the nerve. Nerve target acquisition was confirmed noting generation of paresthesias in the paravertebral regions corresponding to the  level being stimulated as well as rhythmic thumping within the multifidi, the latter being further corroborated via palpation.  Various electrical parameter combinations were tested, and the lead location was adjusted (physically relocated) until the patient indicated paresthesia or muscle tension overlapping the distribution of the patient's typical region of pain.  The stimulating probe was removed from the introducer and a percutaneous lead was guided through the needle and delivered to a location in similar proximity to the nerve. Final  location was verified with electrical stimulation and documented with fluoroscopy & ultrasound.  The introducer needle was removed, and the exposed end of the percutaneous lead was attached to an external stimulator unit. Various electrical parameter combinations were again  tested until the patient indicated paresthesia or muscle tension  overlapping the distribution of the patient's typical region of pain.  After confirming that lead impedance was in the normal range, the external unit was detached, the needle was removed, and the lead was anchored at the skin. The lead was threaded into the connector block and electrical continuity and desired patient response was confirmed. The connector block was attached to the external  stimulator unit.The site was covered with a sterile occlusive dressing and  a fluoroscopic and ultrasound image was taken to document final placement. The patient was observed for stability of vital signs and comfort.  Vitals:   06/21/24 0910 06/21/24 0915 06/21/24 0920 06/21/24 0929  BP: (!) 159/91 (!) 156/76 (!) 133/91 137/75  Pulse:    74  Resp: 16 15 16    Temp:      SpO2: 99% 99% 100% 99%  Weight:      Height:       Start Time: 0912 hrs. End Time: 0920 hrs.  Imaging Guidance (Spinal):          Type of Imaging Technique: Fluoroscopy Guidance (Spinal) and ultrasound guidance to confirm multifidus activation Indication(s):  Fluoroscopy guidance for needle placement to enhance accuracy in procedures requiring precise needle localization for targeted delivery of medication in or near specific anatomical locations not easily accessible without such real-time imaging assistance. Exposure Time: Please see nurses notes. Contrast: None used. Fluoroscopic Guidance: I was personally present during the use of fluoroscopy. Tunnel  Vision Technique used to obtain the best possible view of the target area. Parallax error corrected before commencing the procedure. Direction-depth-direction technique used to introduce the needle under continuous pulsed fluoroscopy. Once target was reached, antero-posterior, oblique, and lateral fluoroscopic projection used confirm needle placement in all planes. Images permanently stored in EMR. Interpretation: No contrast injected. I personally interpreted the imaging intraoperatively. Adequate needle placement confirmed in multiple planes. Permanent images saved into the patient's record.  Antibiotic Prophylaxis:   Anti-infectives (From admission, onward)    None      Indication(s): None identified   Post-operative Assessment:  Post-procedure Vital Signs:  Pulse/HCG Rate: 7474 Temp: (!) 97 F (36.1 C) Resp: 16 BP: 137/75 SpO2: 99 %  Complications: No immediate post-treatment complications observed by team, or reported by patient.  Note: Patient tolerated procedure well A repeat set of vitals were taken after the procedure and the patient was kept under observation following institutional policy, for this type of procedure. Post-procedural neurological assessment was performed, showing return to baseline, prior to discharge. The patient was provided with post-procedure discharge instructions, including a section on how to identify potential problems. Should any problems arise concerning this procedure, the patient was given instructions to immediately contact us , at any time, without  hesitation. In any case, we plan to contact the patient by telephone for a follow-up status report regarding this interventional procedure.  Comments:  No additional relevant information.  Plan of Care  Orders:  Orders Placed This Encounter  Procedures   DG PAIN CLINIC C-ARM 1-60 MIN NO REPORT    Intraoperative interpretation by procedural physician at [facility].    Standing Status:   Standing    Number of Occurrences:   1    Reason for exam::   Assistance in needle guidance and placement for procedures requiring needle placement in or near specific anatomical locations not easily accessible without such assistance.     Medications administered: We administered lidocaine .  See the medical record for exact dosing, route, and time of administration.  Follow-up plan:   Return in about 2 weeks (around 07/05/2024) for left PNS no sedation.       Right L4 PNS 10/20/23, 06/21/24,  06/22/23    Recent Visits Date Type Provider Dept  06/08/24 Office Visit Marcelino Nurse, MD [facility]  04/06/24 Office Visit Marcelino Nurse, MD [facility]  Showing recent visits within past 90 days and meeting all other requirements Today's Visits Date Type Provider Dept  06/21/24 Procedure visit Marcelino Nurse, MD [facility]  Showing today's visits and meeting all other requirements Future Appointments Date Type Provider Dept  07/05/24 Appointment Marcelino Nurse, MD [facility]  Showing future appointments within next 90 days and meeting all other requirements  Disposition: Discharge home  Discharge (Date  Time): 06/21/2024; 0931 hrs.   Primary Care Physician: Lemon Raisin, MD Location: [facility] Note by: Nurse Marcelino, MD (TTS technology used. I apologize for any typographical errors that were not detected and corrected.) Date: 06/21/2024; Time: 9:38 AM

## 2024-06-22 ENCOUNTER — Encounter: Payer: Self-pay | Admitting: Student

## 2024-07-05 ENCOUNTER — Ambulatory Visit: Admitting: Student in an Organized Health Care Education/Training Program

## 2024-07-28 ENCOUNTER — Ambulatory Visit: Admitting: Student in an Organized Health Care Education/Training Program

## 2024-11-23 ENCOUNTER — Ambulatory Visit: Admitting: Student

## 2025-06-23 ENCOUNTER — Ambulatory Visit
